# Patient Record
Sex: Female | Born: 1960
Health system: Southern US, Community
[De-identification: ages and names within clinical notes are randomized; demographics above are authoritative.]

## PROBLEM LIST (undated history)

## (undated) DIAGNOSIS — I10 Essential (primary) hypertension: Secondary | ICD-10-CM

## (undated) DIAGNOSIS — M1990 Unspecified osteoarthritis, unspecified site: Secondary | ICD-10-CM

## (undated) HISTORY — PX: COLONOSCOPY: SHX174

## (undated) HISTORY — PX: CHOLECYSTECTOMY: SHX55

---

## 2000-08-09 ENCOUNTER — Encounter (INDEPENDENT_AMBULATORY_CARE_PROVIDER_SITE_OTHER): Payer: Self-pay | Admitting: Specialist

## 2000-08-09 ENCOUNTER — Inpatient Hospital Stay (HOSPITAL_COMMUNITY): Admission: AD | Admit: 2000-08-09 | Discharge: 2000-08-09 | Payer: Self-pay | Admitting: Obstetrics

## 2000-10-09 ENCOUNTER — Emergency Department (HOSPITAL_COMMUNITY): Admission: EM | Admit: 2000-10-09 | Discharge: 2000-10-09 | Payer: Self-pay | Admitting: Emergency Medicine

## 2002-03-07 ENCOUNTER — Emergency Department (HOSPITAL_COMMUNITY): Admission: EM | Admit: 2002-03-07 | Discharge: 2002-03-07 | Payer: Self-pay

## 2004-01-01 ENCOUNTER — Emergency Department (HOSPITAL_COMMUNITY): Admission: EM | Admit: 2004-01-01 | Discharge: 2004-01-01 | Payer: Self-pay | Admitting: Emergency Medicine

## 2004-05-03 ENCOUNTER — Ambulatory Visit (HOSPITAL_COMMUNITY): Admission: RE | Admit: 2004-05-03 | Discharge: 2004-05-03 | Payer: Self-pay | Admitting: Emergency Medicine

## 2004-05-09 ENCOUNTER — Ambulatory Visit (HOSPITAL_COMMUNITY): Admission: RE | Admit: 2004-05-09 | Discharge: 2004-05-09 | Payer: Self-pay | Admitting: Internal Medicine

## 2005-04-24 ENCOUNTER — Emergency Department (HOSPITAL_COMMUNITY): Admission: EM | Admit: 2005-04-24 | Discharge: 2005-04-25 | Payer: Self-pay | Admitting: Emergency Medicine

## 2006-02-20 ENCOUNTER — Emergency Department (HOSPITAL_COMMUNITY): Admission: EM | Admit: 2006-02-20 | Discharge: 2006-02-20 | Payer: Self-pay | Admitting: Emergency Medicine

## 2006-12-02 ENCOUNTER — Inpatient Hospital Stay (HOSPITAL_COMMUNITY): Admission: EM | Admit: 2006-12-02 | Discharge: 2006-12-06 | Payer: Self-pay | Admitting: Emergency Medicine

## 2006-12-03 ENCOUNTER — Ambulatory Visit: Payer: Self-pay | Admitting: Infectious Diseases

## 2006-12-08 ENCOUNTER — Ambulatory Visit: Payer: Self-pay | Admitting: *Deleted

## 2006-12-15 ENCOUNTER — Ambulatory Visit: Payer: Self-pay | Admitting: Family Medicine

## 2006-12-21 ENCOUNTER — Ambulatory Visit: Payer: Self-pay | Admitting: Infectious Diseases

## 2007-01-04 ENCOUNTER — Ambulatory Visit: Payer: Self-pay | Admitting: Internal Medicine

## 2007-01-19 ENCOUNTER — Ambulatory Visit: Payer: Self-pay | Admitting: Family Medicine

## 2007-01-19 ENCOUNTER — Encounter (INDEPENDENT_AMBULATORY_CARE_PROVIDER_SITE_OTHER): Payer: Self-pay | Admitting: Family Medicine

## 2007-01-19 LAB — CONVERTED CEMR LAB: Pap Smear: NORMAL

## 2007-08-15 ENCOUNTER — Encounter (INDEPENDENT_AMBULATORY_CARE_PROVIDER_SITE_OTHER): Payer: Self-pay | Admitting: *Deleted

## 2007-10-05 ENCOUNTER — Encounter (INDEPENDENT_AMBULATORY_CARE_PROVIDER_SITE_OTHER): Payer: Self-pay | Admitting: Family Medicine

## 2007-10-05 DIAGNOSIS — M479 Spondylosis, unspecified: Secondary | ICD-10-CM | POA: Insufficient documentation

## 2008-06-12 ENCOUNTER — Encounter (INDEPENDENT_AMBULATORY_CARE_PROVIDER_SITE_OTHER): Payer: Self-pay | Admitting: Family Medicine

## 2008-06-12 ENCOUNTER — Ambulatory Visit: Payer: Self-pay | Admitting: Internal Medicine

## 2008-10-02 ENCOUNTER — Ambulatory Visit: Payer: Self-pay | Admitting: Family Medicine

## 2008-10-02 LAB — CONVERTED CEMR LAB
AST: 36 units/L (ref 0–37)
Alkaline Phosphatase: 88 units/L (ref 39–117)
BUN: 17 mg/dL (ref 6–23)
Basophils Absolute: 0 10*3/uL (ref 0.0–0.1)
Creatinine, Ser: 0.81 mg/dL (ref 0.40–1.20)
Eosinophils Absolute: 0.1 10*3/uL (ref 0.0–0.7)
Eosinophils Relative: 2 % (ref 0–5)
HCT: 37.6 % (ref 36.0–46.0)
HDL: 46 mg/dL (ref >39)
LDL Cholesterol: 77 mg/dL (ref 0–99)
Lymphocytes Relative: 40 % (ref 12–46)
MCV: 86.2 fL (ref 78.0–100.0)
Neutrophils Relative %: 52 % (ref 43–77)
Platelets: 339 10*3/uL (ref 150–400)
Prothrombin Time: 13.8 s (ref 11.6–15.2)
RDW: 13.1 % (ref 11.5–15.5)
TSH: 2.152 microintl units/mL (ref 0.350–4.50)
Total CHOL/HDL Ratio: 3.3
VLDL: 27 mg/dL (ref 0–40)
Vit D, 1,25-Dihydroxy: 25 — ABNORMAL LOW (ref 30–89)

## 2008-10-27 ENCOUNTER — Ambulatory Visit: Payer: Self-pay | Admitting: Internal Medicine

## 2008-11-17 ENCOUNTER — Ambulatory Visit: Payer: Self-pay | Admitting: Family Medicine

## 2009-03-02 ENCOUNTER — Encounter (INDEPENDENT_AMBULATORY_CARE_PROVIDER_SITE_OTHER): Payer: Self-pay | Admitting: Family Medicine

## 2009-03-02 ENCOUNTER — Encounter: Payer: Self-pay | Admitting: Family Medicine

## 2009-03-02 ENCOUNTER — Ambulatory Visit: Payer: Self-pay | Admitting: Family Medicine

## 2009-03-02 DIAGNOSIS — K029 Dental caries, unspecified: Secondary | ICD-10-CM | POA: Insufficient documentation

## 2009-04-17 ENCOUNTER — Encounter (INDEPENDENT_AMBULATORY_CARE_PROVIDER_SITE_OTHER): Payer: Self-pay | Admitting: Family Medicine

## 2009-04-17 ENCOUNTER — Ambulatory Visit: Payer: Self-pay | Admitting: Family Medicine

## 2009-04-30 ENCOUNTER — Ambulatory Visit (HOSPITAL_COMMUNITY): Admission: RE | Admit: 2009-04-30 | Discharge: 2009-04-30 | Payer: Self-pay | Admitting: Internal Medicine

## 2009-05-05 ENCOUNTER — Ambulatory Visit: Payer: Self-pay | Admitting: Internal Medicine

## 2009-06-11 ENCOUNTER — Encounter: Admission: RE | Admit: 2009-06-11 | Discharge: 2009-06-11 | Payer: Self-pay | Admitting: Family Medicine

## 2009-07-23 ENCOUNTER — Encounter: Admission: RE | Admit: 2009-07-23 | Discharge: 2009-07-23 | Payer: Self-pay | Admitting: Infectious Diseases

## 2009-08-25 ENCOUNTER — Ambulatory Visit: Payer: Self-pay | Admitting: Family Medicine

## 2009-10-27 ENCOUNTER — Ambulatory Visit: Payer: Self-pay | Admitting: Family Medicine

## 2009-10-27 LAB — CONVERTED CEMR LAB
Albumin: 4.3 g/dL (ref 3.5–5.2)
Alkaline Phosphatase: 108 units/L (ref 39–117)
BUN: 19 mg/dL (ref 6–23)
Glucose, Bld: 122 mg/dL — ABNORMAL HIGH (ref 70–99)
HDL: 38 mg/dL — ABNORMAL LOW (ref >39)
LDL Cholesterol: 102 mg/dL — ABNORMAL HIGH (ref 0–99)
Total Bilirubin: 0.3 mg/dL (ref 0.3–1.2)
Triglycerides: 186 mg/dL — ABNORMAL HIGH (ref <150)
VLDL: 37 mg/dL (ref 0–40)

## 2009-11-05 ENCOUNTER — Ambulatory Visit: Payer: Self-pay | Admitting: Internal Medicine

## 2009-11-16 ENCOUNTER — Ambulatory Visit: Payer: Self-pay | Admitting: Internal Medicine

## 2009-12-03 ENCOUNTER — Ambulatory Visit: Payer: Self-pay | Admitting: Family Medicine

## 2009-12-17 ENCOUNTER — Encounter (INDEPENDENT_AMBULATORY_CARE_PROVIDER_SITE_OTHER): Payer: Self-pay | Admitting: Adult Health

## 2009-12-17 ENCOUNTER — Ambulatory Visit: Payer: Self-pay | Admitting: Family Medicine

## 2009-12-17 LAB — CONVERTED CEMR LAB
ALT: 32 units/L (ref 0–35)
AST: 27 units/L (ref 0–37)
Alkaline Phosphatase: 78 units/L (ref 39–117)
Eosinophils Absolute: 0.1 10*3/uL (ref 0.0–0.7)
Eosinophils Relative: 2 % (ref 0–5)
HCT: 37.6 % (ref 36.0–46.0)
Indirect Bilirubin: 0.1 mg/dL (ref 0.0–0.9)
Lymphs Abs: 3 10*3/uL (ref 0.7–4.0)
MCV: 88.7 fL (ref 78.0–100.0)
Monocytes Absolute: 0.4 10*3/uL (ref 0.1–1.0)
Platelets: 340 10*3/uL (ref 150–400)
Total Protein: 7.6 g/dL (ref 6.0–8.3)
WBC: 6.8 10*3/uL (ref 4.0–10.5)

## 2010-01-12 ENCOUNTER — Ambulatory Visit: Payer: Self-pay | Admitting: Family Medicine

## 2010-01-13 ENCOUNTER — Ambulatory Visit (HOSPITAL_COMMUNITY): Admission: RE | Admit: 2010-01-13 | Discharge: 2010-01-13 | Payer: Self-pay | Admitting: Family Medicine

## 2010-02-19 ENCOUNTER — Ambulatory Visit: Payer: Self-pay | Admitting: Internal Medicine

## 2010-02-22 ENCOUNTER — Ambulatory Visit: Payer: Self-pay | Admitting: Internal Medicine

## 2010-02-22 ENCOUNTER — Ambulatory Visit (HOSPITAL_COMMUNITY): Admission: RE | Admit: 2010-02-22 | Discharge: 2010-02-22 | Payer: Self-pay | Admitting: Internal Medicine

## 2010-04-15 ENCOUNTER — Ambulatory Visit: Payer: Self-pay | Admitting: Family Medicine

## 2010-07-13 ENCOUNTER — Ambulatory Visit: Payer: Self-pay | Admitting: Family Medicine

## 2010-09-10 ENCOUNTER — Ambulatory Visit: Payer: Self-pay | Admitting: Family Medicine

## 2010-10-25 ENCOUNTER — Ambulatory Visit (HOSPITAL_COMMUNITY)
Admission: RE | Admit: 2010-10-25 | Discharge: 2010-10-25 | Payer: Self-pay | Source: Home / Self Care | Admitting: Family Medicine

## 2010-12-20 ENCOUNTER — Encounter: Payer: Self-pay | Admitting: Internal Medicine

## 2011-01-18 ENCOUNTER — Emergency Department (HOSPITAL_COMMUNITY)
Admission: EM | Admit: 2011-01-18 | Discharge: 2011-01-19 | Disposition: A | Discharge status: Home / Self Care | Payer: Self-pay | Attending: Emergency Medicine | Admitting: Emergency Medicine

## 2011-01-18 DIAGNOSIS — R05 Cough: Secondary | ICD-10-CM | POA: Insufficient documentation

## 2011-01-18 DIAGNOSIS — M79609 Pain in unspecified limb: Secondary | ICD-10-CM | POA: Insufficient documentation

## 2011-01-18 DIAGNOSIS — E119 Type 2 diabetes mellitus without complications: Secondary | ICD-10-CM | POA: Insufficient documentation

## 2011-01-18 DIAGNOSIS — R079 Chest pain, unspecified: Secondary | ICD-10-CM | POA: Insufficient documentation

## 2011-01-18 DIAGNOSIS — R059 Cough, unspecified: Secondary | ICD-10-CM | POA: Insufficient documentation

## 2011-01-18 DIAGNOSIS — IMO0001 Reserved for inherently not codable concepts without codable children: Secondary | ICD-10-CM | POA: Insufficient documentation

## 2011-01-18 DIAGNOSIS — R6883 Chills (without fever): Secondary | ICD-10-CM | POA: Insufficient documentation

## 2011-01-18 LAB — DIFFERENTIAL
Lymphocytes Relative: 28 % (ref 12–46)
Lymphs Abs: 2.3 10*3/uL (ref 0.7–4.0)
Monocytes Absolute: 0.6 10*3/uL (ref 0.1–1.0)
Monocytes Relative: 7 % (ref 3–12)
Neutro Abs: 5.1 10*3/uL (ref 1.7–7.7)

## 2011-01-18 LAB — URINALYSIS, ROUTINE W REFLEX MICROSCOPIC
Ketones, ur: NEGATIVE mg/dL
Nitrite: NEGATIVE
Protein, ur: NEGATIVE mg/dL
Urobilinogen, UA: 0.2 mg/dL (ref 0.0–1.0)

## 2011-01-18 LAB — BASIC METABOLIC PANEL
BUN: 16 mg/dL (ref 6–23)
Calcium: 9.6 mg/dL (ref 8.4–10.5)
Creatinine, Ser: 0.8 mg/dL (ref 0.4–1.2)
GFR calc non Af Amer: >60 mL/min (ref >60)
Potassium: 4.3 mEq/L (ref 3.5–5.1)

## 2011-01-18 LAB — CBC
HCT: 37.4 % (ref 36.0–46.0)
Hemoglobin: 12.5 g/dL (ref 12.0–15.0)
MCH: 28.8 pg (ref 26.0–34.0)
MCHC: 33.4 g/dL (ref 30.0–36.0)
RBC: 4.34 MIL/uL (ref 3.87–5.11)

## 2011-01-19 ENCOUNTER — Emergency Department (HOSPITAL_COMMUNITY): Payer: Self-pay

## 2011-01-28 ENCOUNTER — Encounter (INDEPENDENT_AMBULATORY_CARE_PROVIDER_SITE_OTHER): Payer: Self-pay | Admitting: Family Medicine

## 2011-04-15 NOTE — Discharge Summary (Signed)
Natalie Cherry, Natalie Cherry                 ACCOUNT NO.:  [id number]   MEDICAL RECORD NO.:  [id number]          PATIENT TYPE:  INP   LOCATION:  1618                         FACILITY:  [facility]   PHYSICIAN:  Isidor Holts, M.D.  DATE OF BIRTH:  02-Dec-1960   DATE OF ADMISSION:  12/02/2006  DATE OF DISCHARGE:  12/06/2006                               DISCHARGE SUMMARY   DISCHARGE DIAGNOSES:  1. Neurocysticercosis.  2. Type 2 diabetes mellitus.  3. Dyslipidemia.  4. Cervical spondylosis.  5. Acute pharyngitis.  6. History of hypertension.   DISCHARGE MEDICATIONS:  1. Glucophage 500 mg p.o. b.i.d.  2. Glipizide 5 mg p.o. b.i.d.  3. Enteric-coated Aspirin 81 mg p.o. daily.  4. Zocor 20 mg p.o. nightly.  5. Albendazole 400 mg p.o. b.i.d. to be completed on December 17, 2006.  6. Dexamethasone 2 mg p.o. t.i.d. to be completed on December 10, 2006.  7. Magic Mouthwash 5 mL p.o. t.i.d. (swish-and-swallow) for 1 week      only.   PROCEDURES:  1. Two-view chest x-ray dated December 02, 2006.  This showed suboptimal      inspiration with mild bibasilar atelectasis.  2. Head CT scan dated December 02, 2006.  This was a negative      noncontrast head CT.  3. X-ray of cervical spine dated December 02, 2006.  This showed      straightening of the usual lordosis, no evidence of fracture or      static signs of instability, degenerative disk disease, and      spondylosis C5-C6.  4. Brain MRI dated December 02, 2006.  This showed an 8 mm lesion in the      right middle temporal lobe with mild enhancement, 8mm cystic      lesion, possibly with some slight rim enhancement in the right      middle temporal lobe.  Differential diagnosis includes      neurocysticercosis.  5. Lumbar puncture under fluoroscopic guidance dated, December 02, 2006.      This was a successful procedure and there was return of clear      cerebrospinal fluid.  Approximately 8 mL of clear cerebrospinal      fluid was obtained for laboratory  studies.  Microscopic showed WBC 0, RBC 162. Gram stain was negative.  Protein was  20.  Glucose 63.  1. MRI cervical spine dated December 03, 2006.  This showed a moderate      amount of retropharyngeal edema and enhancement likely due to acute      inflammatory pharyngitis.  No evidence of spinal infection or cord      lesions is identified.  Mild cervical disk degeneration is noted.  2. Abdominal ultrasound scan dated December 04, 2006.  This showed no      abnormality.  No gallstones.   CONSULTATIONS:  1. Dr. Deneen Harts, neurologist.  2. Dr. Lina Sayre, infectious disease specialist.  3. Dr. Ether Griffins, ophthalmologist.   ADMISSION HISTORY:  As in H and P notes of December 02, 2006. However, in  brief, this is a  50 year old female, with known history of hypertension,  type 2 diabetes mellitus, and uterine fibroids, who presents with a 2-  day history of left-sided headache, left-sided facial/neck/shoulder and  arm pain/numbness.  Detailed history is significant for the fact that  the patient is originally [place], has resided in the [place] for  approximately 20 years, but frequently travels to [place].  As a Psychologist, clinical  of fact, she was in [place] one month ago.   PHYSICAL EXAMINATION:  Mild left upper and lower extremity weakness as  well as a mild restriction of cervical spine motion and left-sided  mastoid/trapezius muscle tenderness.  The initial head CT scan was  negative for acute pathology.  However, the followup brain MRI showed an  8 mm lesion in the right posterior temporal lobe with mild enhancement  and 8 mm cyst right temporal lobe with mild rim enhancement.  Neurocysticercosis was favored.  The patient was admitted for further  evaluation, investigation, and management.   CLINICAL COURSE:  1. Neurocysticercosis.  This is a patient who frequently travels to an      endemic region, presenting with headache, mild focal neurology, and      brain imaging studies  suggestive for neurocysticercosis.  Neurology      consultation was called, which was kindly provided by Dr. Deneen Harts who agreed with this possibility and recommended MRI of the      neck to look for structural lesions contributing to the patient's      headache and shoulder pain, and also risk factor modification,      against a background of type 2 diabetes mellitus and also agreed      that ID consultation would be a good idea.  ID consultation was      requested.  This was kindly provided by Dr. Lina Sayre, who felt      that while the differential diagnoses was quite lengthy including      fungi, tuberculosis, toxoplasmosis, tumor, classic brain abscess,      and multiple sclerosis, cysticercosis fits best epidemiologically      and clinically.  Lumbar puncture was done under fluoroscopic      guidance.  This showed no white cells.  RBC was 162, glucose 63,      protein 20.  CSF serologies and serum serologies for      neurocysticercosis antigen/antibodies were sent; however, at the      time of this dictation, results had not yet been obtained.      Recommendation from Dr. Maurice March, was to commence treatment for the      clinical diagnosis of neurocysticercosis with a combination of a 2-      week course of albendazole and a 1-week course of dexamethasone.      This was implemented accordingly.   1. Cervical spondylosis.  As mentioned in admission history above, the      patient, at the time of presentation, complained of left facial      numbness and pain as well as left shoulder and arm pain and      numbness.  She also, on physical examination, had restriction of      lateral rotation of neck to the left and had tenderness in the left      sternomastoid and trapezius muscles.  X-ray of the cervical spine,      as well as followup MRI demonstrated mild degenerative joint     disease.  She was managed with simple analgesics, with a      satisfactory clinical response.  By  day #2 of hospitalization, her      symptoms had completely ameliorated.   1. Acute pharyngitis.  This was an incidental finding noted on MRI.      The patient at no time complained of sore throat.  However, against      a background of diabetes mellitus, it is possible that she may have      oral thrush.  She was managed with Magic Mouthwash.   1. Type 2 diabetes mellitus.  The patient is a known case of type 2      diabetes mellitus and, pre-admission, was on 425 mg of metformin      p.o.  She was managed with a carbohydrate-modified diet and a      combination of metformin and Glipizide during her hospitalization,      with a satisfactory glycemic response.  Hemoglobin A1c was 10.2.      It is clear that she has had suboptimal blood glucose control in      the preceding 3 months.  We anticipate even further improvement in      her glycemic control, once steroid treatment is completed on      December 10, 2006.   1. Dyslipidemia.  Lipid profile done as part of workup for risk      factors, showed the following findings:  total cholesterol 169,      triglycerides 109, HDL 31, LDL 116.  The patient has been commenced      on Statin treatment.   1. Hypertension.  The patient has a known history of hypertension,      although she was not on any antihypertensive medications, pre-      admission.  She has remained normotensive throughout her      hospitalization, therefore, no specific treatment has been      instituted.  We expect the patient's primary MD to continue to      monitor her blood pressure, and institute medications if indicated.   1. Abnormal liver function tests.  At the time of initial evaluation,      the patient was noted to have mildly abnormal liver function tests      with AST of 41, ALT 74, alkaline phosphatase 120.  Hepatitis B and      C profiles were negative and liver ultrasound scan was      unremarkable.  The patient has been reassured accordingly.    DISPOSITION:  As of December 06, 2006, the patient was completely  asymptomatic and keen to go home.  She had no discernible extremity  weakness and no longer complained of pain.  She was therefore deemed  sufficiently clinically recovered and stable to be discharged on December 06, 2006.  The patient underwent slit lamp examination by Dr. Ether Griffins,  ophthalmologist, on December 05, 2006, which showed no evidence of retinal  neurocysticercosis; however, it is felt that she will benefit from  general ophthalmologic evaluation and followup on an outpatient basis,  with ophthalmologist in 1 month, due to subnormal visual acuity.   DIET:  Carbohydrate modified/Heart healthy diet.   ACTIVITY:  As tolerated.   WOUND CARE:  Nonapplicable.   PAIN MANAGEMENT:  Nonapplicable.   FOLLOWUP INSTRUCTIONS:  The patient is instructed to follow up with Dr. Lina Sayre, infectious disease specialist after 2 weeks.  Telephone  number [phone].  The  patient is also instructed to follow up with Dr.  Ether Griffins, eye specialist, in 1 month.  Telephone number [phone].  She  has been strongly recommended to establish a primary M.D. for routine  and preventative care, at [facility].  Appropriate information has  been supplied.  All of this has been communicated to patient and her  daughter.  They have verbalized understanding.      Isidor Holts, M.D.  Electronically Signed     CO/MEDQ  D:  12/06/2006  T:  12/06/2006  Job:  [id number]   cc:   [facility]   Fransisco Hertz, M.D.  Fax: [phone]   Pasty Spillers. Maple Hudson, M.D.  Fax: [phone]

## 2011-04-15 NOTE — Consult Note (Signed)
Natalie Cherry, Natalie Cherry                 ACCOUNT NO.:  [id number]   MEDICAL RECORD NO.:  [id number]          Cherry TYPE:  INP   LOCATION:  1618                         FACILITY:  [facility]   PHYSICIAN:  Bevelyn Buckles. Champey, M.D.DATE OF BIRTH:  Feb 21, 1961   DATE OF CONSULTATION:  12/03/2006  DATE OF DISCHARGE:                                 CONSULTATION   REASON FOR CONSULTATION:  Evaluation for headache, left upper extremity  pain, numbness and possible neurocystercicosis.   HISTORY OF PRESENT ILLNESS:  Natalie Cherry is a 50 year old Hispanic  female with multiple medical problems who presents with three to four  day history of left-sided headache, left upper extremity pain and  numbness.  Her symptoms started gradually with left pain starting behind  her left ear and radiating to Natalie left neck and left upper extremity.  Her symptoms have been fairly constant; however, have been improving  gradually over Natalie last two days.  Natalie Cherry describes it is as  pulsating, 10 out of 10, in intensity and now is around 6 out of 10 in  intensity with pain medicines helping.  Natalie Cherry denies any right  upper extremity symptoms, bilateral lower extremity symptoms.  Natalie  Cherry denies any vision changes, speech changes, swallowing problems  or chewing problems, dizziness, vertigo, balance problems, falls, or  loss of consciousness.  Daughter translates secondary to language  barrier.   PAST MEDICAL HISTORY:  Positive for hypertension and diabetes.   MEDICATIONS:  Protonix, metformin, insulin, Darvocet p.r.n., Zofran  p.r.n.   ALLERGIES:  Natalie Cherry has no known drug allergies.   SOCIAL HISTORY:  Natalie Cherry lives with her husband.  Denies alcohol or  tobacco use.   FAMILY HISTORY:  Positive for diabetes and asthma.   REVIEW OF SYSTEMS:  Positive as per HPI.  Review of systems negative as  per HPI and graded on 8 other organ systems.   PHYSICAL EXAMINATION:  VITAL SIGNS:  Temperature 97.1,  blood pressure  114/64, pulse 67, respirations 20, oxygen saturation 96%.  HEENT:  Normocephalic, atraumatic.  Extraocular muscles intact.  Visual  fields are intact.  NECK:  Supple.  No carotid bruits.  HEART:  Regular.  LUNGS:  Clear.  ABDOMEN:  Soft, nontender.  EXTREMITIES:  Good pulses.  NEUROLOGIC:  Natalie Cherry is awake, alert, following commands  appropriately.  Language is fluent.  There is only barrier for which Natalie  daughter translates.  Cranial nerves II-XII grossly intact.  Motor  examination shows 5 out of 5 strength in Natalie right upper extremity and  bilateral lower extremities.  Natalie Cherry has left upper extremity  limited secondary to extreme discomfort.  Seems to be approximately 4 to  4+ out of 5 strength.  Sensory examination is within normal limits to  light touch.  Reflexes are 1-2+ throughout, toes are withdrawn  bilaterally.  Cerebellar function is within normal limits.  Finger-to-  nose.  Gait is not tested secondary to safety.   LABORATORY DATA:  WBC 9.7, hemoglobin 13.7, hematocrit 40.5, platelets  373,000.  Sodium 135, potassium 3.9, chloride 104, CO2  26, BUN 13,  creatinine 0.12, glucose 145, AST 41, ALT 7.4.  Hemoglobin A1C 10.2.  Calcium 9, cholesterol 1.67, LDL 116.  CSF showed no wbc's and rbc's  162, glucose 63, protein 20.  CT scan of Natalie head was negative.  MRI of  Natalie brain showed a right mesiotemporal enhancing lesion and a right  medial temporal cystic lesion with rim enhancement.   IMPRESSION:  This 50 year old Hispanic female has left-sided head and  neck and upper extremity and numbness.  I reviewed Natalie MRI and agree  with possibility of neurocysticercosis.  Serology and CSF have been  sent.  I would recommend continued pain management and get infectious  disease consult which has already occurred.  Recommend getting PT/OT  consult for her pain and numbness.  I will check an MRI of Natalie neck to  look for any structural lesions that also could  be contributing.  Natalie  Cherry does seem to have risk factors for vascular disease including  hemoglobin A1C and elevated cholesterol.  Recommend starting an aspirin  a day. Will consider statin a day as well.  However, currently has  elevated LFTs which need to be evaluated first.  Natalie Cherry needs  optimal diabetes controls as well.  Would recommend checking other  possible etiologies through infectious disease such as HIV or other  infectious or inflammatory processes.  We will follow Natalie Cherry with  you.      Bevelyn Buckles. Nash Shearer, M.D.  Electronically Signed     DRC/MEDQ  D:  12/03/2006  T:  12/03/2006  Job:  [id number]

## 2011-04-15 NOTE — H&P (Signed)
Natalie Cherry, Natalie Cherry                 ACCOUNT NO.:  [id number]   MEDICAL RECORD NO.:  [id number]          PATIENT TYPE:  INP   LOCATION:  1618                         FACILITY:  [facility]   PHYSICIAN:  Isidor Holts, M.D.  DATE OF BIRTH:  February 29, 1961   DATE OF ADMISSION:  12/02/2006  DATE OF DISCHARGE:                              HISTORY & PHYSICAL   PRIMARY MEDICAL DOCTOR:  Unassigned.   CHIEF COMPLAINT:  Left-sided headache for 2 days, also left-sided  facial, neck, shoulder, and arm pain/numbness.   HISTORY OF PRESENT ILLNESS:  This is a 50 year old female.  For past  medical history, see below.  The patient is a [place] and has very poor  command of English; therefore, history is mainly obtained from the  patient's daughter, who has accompanied her to the emergency department  and has been able to translate quite effectively.  Apparently, the  patient was quite well until 2 days ago, when she developed a headache  affecting mainly the left side of her head, unrelieved by over-the-  counter ibuprofen and Goody powders, not associated with vomiting or  fever.  According to the patient, the headache also affects the left  side of her head, the neck, left shoulder, and arm and is associated  with left-sided weakness.  The patient has been a resident in the [place] for over 20 years but regularly travels to [place].  As a matter  of fact, she was in [place] last month.   PAST MEDICAL HISTORY:  1. Hypertension.  2. Type 2 diabetes mellitus.  3. History of uterine fibroids.   MEDICATION HISTORY.:  Metformin (850 mg) Half tablet daily.   ALLERGIES:  NO KNOWN DRUG ALLERGIES.   SOCIAL HISTORY:  The patient is married, has six children all of whom  are alive and well.  She is a nonsmoker, nondrinker, has no history of  drug abuse.   FAMILY HISTORY:  The patient's mother died of complications of diabetes  mellitus.  She also had heart disease and hypertension.  Her father  passed  away from complications of bronchial asthma.  Family history is  otherwise noncontributory.   PHYSICAL EXAMINATION:  VITAL SIGNS:  Temperature 98.8, pulse 86 per  minute regular, respiratory 21, BP 112/58-mmHg, pulse oximeter 97% on  room air.  GENERAL:  The patient appears quite alert, communicative, not short of  breath at rest, follows commands quite easily, does not appear to be in  obvious acute discomfort, after administration of morphine intravenously  in the emergency department.  She also does not appear to be  photophobic.  HEENT:  No clinical pallor, no jaundice, no conjunctival injection.  Throat is clear.  NECK:  Supple.  JVP not seen.  No palpable lymphadenopathy.  No palpable  goiter.  CHEST:  Clinically clear to auscultation.  No wheezes, no crackles.  HEART:  Sounds one and two heard, normal, regular, no murmurs.  ABDOMEN:  Moderately obese, soft and nontender.  There is no palpable  organomegaly.  No palpable masses.  Normal bowel sounds.  EXTREMITIES:  Lower extremity examination  no pitting edema.  Palpable  peripheral pulses.  MUSCULOSKELETAL:  The patient has full range of motion all joints.  However, experiences mild restriction of lateral rotation of neck,  secondary to pain.  She also has appreciable tenderness left  sternomastoid and left trapezius muscles.  CENTRAL NERVOUS SYSTEM:  The patient is alert, oriented x3,  communicative, has no obvious cranial nerve deficits.  However, she has  mild weakness left upper and lower extremities with power of 4-/5.  Plantar's are downgoing bilaterally.  She has normal power, tone, and  coordination of the right upper and lower extremities.  Coordination is  unimpaired on the left.   INVESTIGATIONS:  CBC:  WBC 9.7, eosinophils 1%, hemoglobin 13.7,  hematocrit 40.5, platelets 373.  Urinalysis negative.  Urine pregnancy  test is negative.  Electrolytes:  Sodium 135, potassium 3.9, chloride  104, CO2 26, BUN 3,  creatinine 0.12, glucose 145, AST 41.  EKG dated  December 02, 2006, shows sinus rhythm regular, 87 beats per minute, left  axis deviation.  No acute ischemic changes.  X-ray, cervical spine,  dated December 02, 2006, shows DJD and spondylosis C5-C6.  Chest x-ray,  dated December 02, 2006, shows suboptimal inspiration and mild bibasilar  atelectasis.  Head CT scan, dated December 02, 2006, is negative with no  evidence of raised intracranial pressure or mass effect.  Brain MRI,  dated December 02, 2006, shows an 8-mm lesion right posterior temporal  lobe with mild enhancement, also 8-mm cyst right temporal lobe with mild  rim enhancement.  No calcifications.  Neurocysticercosis is favored.   ASSESSMENT AND PLAN:  1. Persistent headache with left-sided paresthesia, pain, and      weakness.  This, against a background of frequent travel to      possible endemic region and suggestive MRI findings, suggest the      possibility of neurocysticercosis.  We shall manage with      analgesics, frequent neuro checks, do blood and CSF serology, and      request neurology as well as infectious diseases consultations.   1. Cervical spondylosis.  This is confirmed by x-ray and may be      contributory to left neck, shoulder as well as arm pain.  The      patient also has clinically elicitable left sternomastoid and      trapezius tenderness.  We shall manage with analgesics.   1. Type 2 diabetes mellitus, query control.  We will do a hemoglobin      A1c for completeness, commence Glucophage therapy, use a      carbohydrate-modified diet, and sliding scale insulin coverage.   1. History of hypertension.  The patient is normotensive at present.      We shall monitor.   Further management will depend on clinical course.      Isidor Holts, M.D.  Electronically Signed     CO/MEDQ  D:  12/02/2006  T:  12/02/2006  Job:  [id number]

## 2011-04-26 ENCOUNTER — Ambulatory Visit (HOSPITAL_COMMUNITY)
Admission: RE | Admit: 2011-04-26 | Discharge: 2011-04-26 | Disposition: A | Discharge status: Home / Self Care | Payer: Self-pay | Source: Ambulatory Visit | Attending: Family Medicine | Admitting: Family Medicine

## 2011-04-26 ENCOUNTER — Other Ambulatory Visit (HOSPITAL_COMMUNITY): Payer: Self-pay | Admitting: Family Medicine

## 2011-04-26 DIAGNOSIS — M899 Disorder of bone, unspecified: Secondary | ICD-10-CM | POA: Insufficient documentation

## 2011-04-26 DIAGNOSIS — M949 Disorder of cartilage, unspecified: Secondary | ICD-10-CM | POA: Insufficient documentation

## 2011-04-26 DIAGNOSIS — M069 Rheumatoid arthritis, unspecified: Secondary | ICD-10-CM

## 2011-04-26 DIAGNOSIS — M25649 Stiffness of unspecified hand, not elsewhere classified: Secondary | ICD-10-CM | POA: Insufficient documentation

## 2011-04-26 DIAGNOSIS — M25549 Pain in joints of unspecified hand: Secondary | ICD-10-CM | POA: Insufficient documentation

## 2012-03-12 ENCOUNTER — Encounter (HOSPITAL_COMMUNITY): Payer: Self-pay

## 2012-03-12 ENCOUNTER — Emergency Department (INDEPENDENT_AMBULATORY_CARE_PROVIDER_SITE_OTHER)
Admission: EM | Admit: 2012-03-12 | Discharge: 2012-03-12 | Disposition: A | Discharge status: Home / Self Care | Payer: Self-pay | Source: Home / Self Care | Attending: Family Medicine | Admitting: Family Medicine

## 2012-03-12 DIAGNOSIS — M069 Rheumatoid arthritis, unspecified: Secondary | ICD-10-CM

## 2012-03-12 DIAGNOSIS — Z760 Encounter for issue of repeat prescription: Secondary | ICD-10-CM

## 2012-03-12 HISTORY — DX: Essential (primary) hypertension: I10

## 2012-03-12 HISTORY — DX: Unspecified osteoarthritis, unspecified site: M19.90

## 2012-03-12 MED ORDER — PREDNISONE 5 MG PO TABS: 5.0000 mg | ORAL_TABLET | Freq: Every day | ORAL | Status: AC

## 2012-03-12 MED ORDER — PREDNISONE 5 MG PO TABS: 5.0000 mg | ORAL_TABLET | Freq: Every day | ORAL | Status: DC

## 2012-03-12 NOTE — ED Provider Notes (Signed)
History     CSN: [id number]  Arrival date & time 03/12/12  1343   First MD Initiated Contact with Patient 03/12/12 1534      Chief Complaint  Patient presents with  . Arthritis    (Consider location/radiation/quality/duration/timing/severity/associated sxs/prior treatment) HPI Comments: The patient states she has ra and has been out of prednisone x 1 months. She normally taked 5 mg daily. [facility] . She has pain in her back and hands. She has been taking naproxen with minimal relief.   The history is provided by the patient and a relative. The history is limited by a language barrier.    Past Medical History  Diagnosis Date  . Hypertension   . Arthritis   . Diabetes mellitus     History reviewed. No pertinent past surgical history.  History reviewed. No pertinent family history.  History  Substance Use Topics  . Smoking status: Never Smoker   . Smokeless tobacco: Not on file  . Alcohol Use: No    OB History    Grav Para Term Preterm Abortions TAB SAB Ect Mult Living                  Review of Systems  Constitutional: Negative.   HENT: Negative.   Respiratory: Negative.   Cardiovascular: Negative.   Gastrointestinal: Negative.   Genitourinary: Negative.     Allergies  Review of patient's allergies indicates no known allergies.  Home Medications   Current Outpatient Rx  Name Route Sig Dispense Refill  . MECLIZINE HCL 25 MG PO TABS Oral Take 25 mg by mouth 4 (four) times daily as needed.    Marland Kitchen METFORMIN HCL 500 MG PO TABS Oral Take 500 mg by mouth 2 (two) times daily with a meal.    . NAPROXEN 500 MG PO TABS Oral Take 500 mg by mouth 2 (two) times daily with a meal.    . TRAZODONE HCL 50 MG PO TABS Oral Take 50 mg by mouth at bedtime.    Marland Kitchen PREDNISONE 5 MG PO TABS Oral Take 1 tablet (5 mg total) by mouth daily. 35 tablet 0  . PREDNISONE 5 MG PO TABS Oral Take 1 tablet (5 mg total) by mouth daily. 35 tablet 0    BP 100/51   Pulse 74  Temp(Src) 98 F (36.7 C) (Oral)  Resp 14  SpO2 98%  Physical Exam  Nursing note and vitals reviewed. Constitutional: She appears well-developed and well-nourished.  HENT:  Head: Normocephalic and atraumatic.  Cardiovascular: Normal rate and regular rhythm.   Pulmonary/Chest: Effort normal and breath sounds normal.  Musculoskeletal:       Pain in fingers and hands. No increased warmpth  Skin: Skin is warm and dry.    ED Course  Procedures (including critical care time)  Labs Reviewed - No data to display No results found.   1. Rheumatoid arthritis   2. Medication refill       MDM          Randa Spike, MD 03/12/12 1601

## 2012-03-12 NOTE — ED Notes (Signed)
Pt has "inflammatory arthritis". States over the weekend hands and feet have swollen and are painful.  Also c/o pain in back.  [facility] told her to come here, she is out of prednisone and trazodone

## 2012-03-12 NOTE — Discharge Instructions (Signed)
Follow up with your pcp as scheduled. Artritis Reumatoide (Arthritis, Rheumatoid) Usted tiene artritis reumatoide. Es una artritis causada por problemas en el sistema inmunolgico. Los sntomas incluyen hinchazn, Engineer, mining, sensibilidad y ardor en las articulaciones afectadas. Ocurre ms a [employer], los pies, y las rodillas. El dolor puede trasladarse de una articulacin a [place]. Tambin podrn aparecer otros sntomas como fiebre, prdida de [place], cansancio y anemia. Esta enfermedad no tiene [place]. La mayor parte de las personas logran estar bien con tratamiento. Unos pocos pacientes sufren daos graves en la articulacin y discapacidad. Utilice los medicamentos de venta libre o de prescripcin para Chief Technology Officer o Environmental health practitioner segn se lo indique el profesional que lo asiste. Pueden utilizarse drogas ms poderosas tales como cortisona y narcticos, cuando los sntomas son ms graves. Las terapias de calor y otras terapias fsicas a menudo resultan de [place]. Existen nuevos medicamentos que tratan el problema en el sistema inmunolgico subyacente. Estos son prescritos por especialistas. Deber Printmaker mayor actividad que le sea posible, pero deber reposar ms si tiene un ataque de Engineer, mining. En ocasiones es necesario entablillar de forma temporaria las articulaciones doloridas. Vea a su mdico o a Music therapist en articulaciones o reumatlogo para realizar un tratamiento adecuado a Air cabin crew. Document Released: 11/14/2005 Document Revised: 11/03/2011 [facility] Patient Information 2012 [place], [place].

## 2012-04-10 ENCOUNTER — Other Ambulatory Visit (HOSPITAL_COMMUNITY): Payer: Self-pay | Admitting: Family Medicine

## 2012-04-10 DIAGNOSIS — M1990 Unspecified osteoarthritis, unspecified site: Secondary | ICD-10-CM

## 2012-04-17 ENCOUNTER — Ambulatory Visit (HOSPITAL_COMMUNITY)
Admission: RE | Admit: 2012-04-17 | Discharge: 2012-04-17 | Disposition: A | Discharge status: Home / Self Care | Payer: Self-pay | Source: Ambulatory Visit | Attending: Family Medicine | Admitting: Family Medicine

## 2012-04-17 DIAGNOSIS — Z13820 Encounter for screening for osteoporosis: Secondary | ICD-10-CM | POA: Insufficient documentation

## 2012-04-17 DIAGNOSIS — M129 Arthropathy, unspecified: Secondary | ICD-10-CM | POA: Insufficient documentation

## 2012-04-17 DIAGNOSIS — M1990 Unspecified osteoarthritis, unspecified site: Secondary | ICD-10-CM

## 2014-01-29 ENCOUNTER — Ambulatory Visit: Payer: No Typology Code available for payment source | Attending: Internal Medicine | Admitting: Internal Medicine

## 2014-01-29 ENCOUNTER — Encounter: Payer: Self-pay | Admitting: Internal Medicine

## 2014-01-29 VITALS — BP 108/71 | HR 72 | Temp 98.5°F | Resp 16 | Wt 162.6 lb

## 2014-01-29 DIAGNOSIS — M069 Rheumatoid arthritis, unspecified: Secondary | ICD-10-CM | POA: Insufficient documentation

## 2014-01-29 DIAGNOSIS — H9190 Unspecified hearing loss, unspecified ear: Secondary | ICD-10-CM

## 2014-01-29 DIAGNOSIS — Z139 Encounter for screening, unspecified: Secondary | ICD-10-CM

## 2014-01-29 DIAGNOSIS — E1165 Type 2 diabetes mellitus with hyperglycemia: Principal | ICD-10-CM

## 2014-01-29 DIAGNOSIS — Z1211 Encounter for screening for malignant neoplasm of colon: Secondary | ICD-10-CM

## 2014-01-29 DIAGNOSIS — E119 Type 2 diabetes mellitus without complications: Secondary | ICD-10-CM

## 2014-01-29 DIAGNOSIS — I10 Essential (primary) hypertension: Secondary | ICD-10-CM | POA: Insufficient documentation

## 2014-01-29 DIAGNOSIS — E559 Vitamin D deficiency, unspecified: Secondary | ICD-10-CM | POA: Insufficient documentation

## 2014-01-29 DIAGNOSIS — IMO0001 Reserved for inherently not codable concepts without codable children: Secondary | ICD-10-CM | POA: Insufficient documentation

## 2014-01-29 LAB — LIPID PANEL
CHOLESTEROL: 160 mg/dL (ref 0–200)
HDL: 48 mg/dL (ref >39)
LDL Cholesterol: 92 mg/dL (ref 0–99)
TRIGLYCERIDES: 100 mg/dL (ref <150)
Total CHOL/HDL Ratio: 3.3 Ratio
VLDL: 20 mg/dL (ref 0–40)

## 2014-01-29 LAB — CBC WITH DIFFERENTIAL/PLATELET
BASOS PCT: 0 % (ref 0–1)
Basophils Absolute: 0 10*3/uL (ref 0.0–0.1)
EOS ABS: 0 10*3/uL (ref 0.0–0.7)
EOS PCT: 0 % (ref 0–5)
HCT: 33.3 % — ABNORMAL LOW (ref 36.0–46.0)
Hemoglobin: 10.3 g/dL — ABNORMAL LOW (ref 12.0–15.0)
LYMPHS ABS: 1.1 10*3/uL (ref 0.7–4.0)
Lymphocytes Relative: 14 % (ref 12–46)
MCH: 24.1 pg — AB (ref 26.0–34.0)
MCHC: 30.9 g/dL (ref 30.0–36.0)
MCV: 77.8 fL — AB (ref 78.0–100.0)
Monocytes Absolute: 0.3 10*3/uL (ref 0.1–1.0)
Monocytes Relative: 4 % (ref 3–12)
Neutro Abs: 6.2 10*3/uL (ref 1.7–7.7)
Neutrophils Relative %: 82 % — ABNORMAL HIGH (ref 43–77)
PLATELETS: 524 10*3/uL — AB (ref 150–400)
RBC: 4.28 MIL/uL (ref 3.87–5.11)
RDW: 16 % — AB (ref 11.5–15.5)
WBC: 7.5 10*3/uL (ref 4.0–10.5)

## 2014-01-29 LAB — COMPLETE METABOLIC PANEL WITH GFR
ALT: 10 U/L (ref 0–35)
AST: 10 U/L (ref 0–37)
Albumin: 3.4 g/dL — ABNORMAL LOW (ref 3.5–5.2)
Alkaline Phosphatase: 92 U/L (ref 39–117)
BILIRUBIN TOTAL: 0.2 mg/dL (ref 0.2–1.2)
BUN: 16 mg/dL (ref 6–23)
CO2: 24 meq/L (ref 19–32)
CREATININE: 0.59 mg/dL (ref 0.50–1.10)
Calcium: 9.1 mg/dL (ref 8.4–10.5)
Chloride: 102 mEq/L (ref 96–112)
GLUCOSE: 336 mg/dL — AB (ref 70–99)
Potassium: 4.2 mEq/L (ref 3.5–5.3)
Sodium: 136 mEq/L (ref 135–145)
Total Protein: 7 g/dL (ref 6.0–8.3)

## 2014-01-29 LAB — POCT GLYCOSYLATED HEMOGLOBIN (HGB A1C): Hemoglobin A1C: 12

## 2014-01-29 LAB — GLUCOSE, POCT (MANUAL RESULT ENTRY): POC Glucose: 341 mg/dl — AB (ref 70–99)

## 2014-01-29 LAB — TSH: TSH: 0.721 u[IU]/mL (ref 0.350–4.500)

## 2014-01-29 MED ORDER — FOLIC ACID 1 MG PO TABS: 1.0000 mg | ORAL_TABLET | Freq: Every day | ORAL | Status: DC

## 2014-01-29 MED ORDER — INSULIN ASPART 100 UNIT/ML ~~LOC~~ SOLN
10.0000 [IU] | Freq: Once | SUBCUTANEOUS | Status: AC
  Administered 2014-01-29: 10 [IU] via SUBCUTANEOUS

## 2014-01-29 MED ORDER — SITAGLIPTIN PHOS-METFORMIN HCL 50-1000 MG PO TABS: 1.0000 | ORAL_TABLET | Freq: Two times a day (BID) | ORAL | Status: DC

## 2014-01-29 MED ORDER — FREESTYLE SYSTEM KIT: 1.0000 | PACK | Status: DC | PRN

## 2014-01-29 MED ORDER — NAPROXEN 500 MG PO TABS: 500.0000 mg | ORAL_TABLET | Freq: Two times a day (BID) | ORAL | Status: DC

## 2014-01-29 NOTE — Progress Notes (Signed)
Patient here to establish care History of arthritis and DM

## 2014-01-29 NOTE — Progress Notes (Signed)
Patient Demographics  Natalie Cherry, is a 53 y.o. female  TRR:[id number]  BFX:[id number]  DOB - [birth year]  CC:  Chief Complaint  Patient presents with  . Establish Care       HPI: Natalie Cherry is a 53 y.o. female here today to establish medical care. Patient has history of diabetes for several years as per patient she was taking metformin in the past which was discontinued and she was put on glipizide which she takes 10 mg twice a day, she denies any problem with metformin, at some point she was using Januvia which use to control her symptoms very well, today her blood sugar level is 341 , hemoglobin A1c is 12%, she is history of rheumatoid arthritis and follows up with the rheumatologist and is on methotrexate and prednisone, patient is requesting prescription for naproxen and folic acid. Patient complains of reduced hearing and ringing sensation in the left ear. Patient has No headache, No chest pain, No abdominal pain - No Nausea, No new weakness tingling or numbness, No Cough - SOB.  No Known Allergies Past Medical History  Diagnosis Date  . Hypertension   . Arthritis   . Diabetes mellitus    Current Outpatient Prescriptions on File Prior to Visit  Medication Sig Dispense Refill  . meclizine (ANTIVERT) 25 MG tablet Take 25 mg by mouth 4 (four) times daily as needed.      . metFORMIN (GLUCOPHAGE) 500 MG tablet Take 500 mg by mouth 2 (two) times daily with a meal.      . predniSONE (DELTASONE) 5 MG tablet Take 1 tablet (5 mg total) by mouth daily.  35 tablet  0  . traZODone (DESYREL) 50 MG tablet Take 50 mg by mouth at bedtime.       No current facility-administered medications on file prior to visit.   Family History  Problem Relation Age of Onset  . Diabetes Mother    History   Social History  . Marital Status: Married    Spouse Name: N/A    Number of Children: N/A  . Years of Education: N/A   Occupational History  . Not on file.   Social History Main Topics   . Smoking status: Never Smoker   . Smokeless tobacco: Not on file  . Alcohol Use: No  . Drug Use: No  . Sexual Activity: Not on file   Other Topics Concern  . Not on file   Social History Narrative  . No narrative on file    Review of Systems: Constitutional: Negative for fever, chills, diaphoresis, activity change, appetite change and fatigue. HENT: Negative for ear pain, nosebleeds, congestion, facial swelling, rhinorrhea, neck pain, neck stiffness and ear discharge.  Eyes: Negative for pain, discharge, redness, itching and visual disturbance. Respiratory: Negative for cough, choking, chest tightness, shortness of breath, wheezing and stridor.  Cardiovascular: Negative for chest pain, palpitations and leg swelling. Gastrointestinal: Negative for abdominal distention. Genitourinary: Negative for dysuria, urgency, frequency, hematuria, flank pain, decreased urine volume, difficulty urinating and dyspareunia.  Musculoskeletal: Negative for back pain, joint swelling, +arthralgia and gait problem. Neurological: Negative for dizziness, tremors, seizures, syncope, facial asymmetry, speech difficulty, weakness, light-headedness, numbness and headaches.  Hematological: Negative for adenopathy. Does not bruise/bleed easily. Psychiatric/Behavioral: Negative for hallucinations, behavioral problems, confusion, dysphoric mood, decreased concentration and agitation.    Objective:   Filed Vitals:   01/29/14 1001  BP: 108/71  Pulse: 72  Temp: 98.5 F (36.9 C)  Resp: 16  Physical Exam: Constitutional: Patient appears well-developed and well-nourished. No distress. HENT: Normocephalic, atraumatic, External right and left ear normal. Oropharynx is clear and moist. Left TM not congested? Sclerosed  Eyes: Conjunctivae and EOM are normal. PERRLA, no scleral icterus. Neck: Normal ROM. Neck supple. No JVD. No tracheal deviation. No thyromegaly. CVS: RRR, S1/S2 +, no murmurs, no gallops, no  carotid bruit.  Pulmonary: Effort and breath sounds normal, no stridor, rhonchi, wheezes, rales.  Abdominal: Soft. BS +, no distension, tenderness, rebound or guarding.  Musculoskeletal: Tenderness at the metacarpophalangeal and interphalangeal joint patient has difficulty making a fist..  Neuro: Alert. Normal reflexes, muscle tone coordination. No cranial nerve deficit. Skin: Skin is warm and dry. No rash noted. Not diaphoretic. No erythema. No pallor.   Lab Results  Component Value Date   WBC 8.4 01/18/2011   HGB 12.5 01/18/2011   HCT 37.4 01/18/2011   MCV 86.2 01/18/2011   PLT 424* 01/18/2011   Lab Results  Component Value Date   CREATININE 0.80 01/18/2011   BUN 16 01/18/2011   NA 138 01/18/2011   K 4.3 01/18/2011   CL 105 01/18/2011   CO2 23 01/18/2011    No results found for this basename: HGBA1C   Lipid Panel     Component Value Date/Time   CHOL 177 10/27/2009 2040   TRIG 186* 10/27/2009 2040   HDL 38* 10/27/2009 2040   CHOLHDL 4.7 Ratio 10/27/2009 2040   VLDL 37 10/27/2009 2040   LDLCALC 102* 10/27/2009 2040       Assessment and plan:   1. DM (diabetes mellitus) Results for orders placed in visit on 01/29/14  GLUCOSE, POCT (MANUAL RESULT ENTRY)      Result Value Ref Range   POC Glucose 341 (*) 70 - 99 mg/dl   patient was given aspart 10 units in clinic  Diabetes is uncontrolled I have started patient on Janumet, patient will discontinue glipizide, she'll monitor her fingerstick sugar level, follow up in 6 weeks. If fasting sugar is still elevated we'll consider adding glipizide.  - Glucose (CBG) - HgB A1c - insulin aspart (novoLOG) injection 10 Units; Inject 10 Units into the skin once. - sitaGLIPtin-metformin (JANUMET) 50-1000 MG per tablet; Take 1 tablet by mouth 2 (two) times daily with a meal.  Dispense: 60 tablet; Refill: 3 - glucose monitoring kit (FREESTYLE) monitoring kit; 1 each by Does not apply route as needed for other. Dispense any model that is covered-  dispense testing supplies for Q AC/ HS accuchecks- 1 month supply with one refil.  Dispense: 1 each; Refill: 1 - Ambulatory referral to Ophthalmology  2. Rheumatoid arthritis Following up with the rheumatologist. - naproxen (NAPROSYN) 500 MG tablet; Take 1 tablet (500 mg total) by mouth 2 (two) times daily with a meal.  Dispense: 60 tablet; Refill: 3 - folic acid (FOLVITE) 1 MG tablet; Take 1 tablet (1 mg total) by mouth daily.  Dispense: 30 tablet; Refill: 4  3. Screening Ordered baseline blood work. - MM DIGITAL SCREENING BILATERAL; Future - CBC with Differential - COMPLETE METABOLIC PANEL WITH GFR - TSH - Lipid panel - Vit D  25 hydroxy (rtn osteoporosis monitoring)  4. Hearing reduced  - Ambulatory referral to ENT  5. Special screening for malignant neoplasms, colon  - Ambulatory referral to Gastroenterology      Health Maintenance -Colonoscopy: referral done   -Mammogram: ordered    Return in about 6 weeks (around 03/12/2014) for diabetes.      Lorayne Marek, MD

## 2014-01-30 ENCOUNTER — Other Ambulatory Visit: Payer: Self-pay | Admitting: Internal Medicine

## 2014-01-30 DIAGNOSIS — D649 Anemia, unspecified: Secondary | ICD-10-CM

## 2014-01-30 LAB — VITAMIN D 25 HYDROXY (VIT D DEFICIENCY, FRACTURES): Vit D, 25-Hydroxy: 61 ng/mL (ref 30–89)

## 2014-01-31 ENCOUNTER — Telehealth: Payer: Self-pay

## 2014-01-31 NOTE — Telephone Encounter (Signed)
Interpreter line used Patient not available Message left to return our call 

## 2014-01-31 NOTE — Telephone Encounter (Signed)
Message copied by Lestine Mount on Fri Jan 31, 2014  2:19 PM ------      Message from: Doris Cheadle      Created: Thu Jan 30, 2014 10:18 AM       Blood work reviewed, noticed anemia, I have ordered anemia panel advise patient to do blood work prior to the next visit, patient has history of rheumatoid arthritis and is on methotrexate as well taking folate , anemia can be secondary to her methotrexate use, patient has already been referred to GI for screening colonoscopy. ------

## 2014-02-12 ENCOUNTER — Ambulatory Visit: Payer: No Typology Code available for payment source | Attending: Internal Medicine

## 2014-02-12 ENCOUNTER — Other Ambulatory Visit: Payer: Self-pay

## 2014-02-12 DIAGNOSIS — E119 Type 2 diabetes mellitus without complications: Secondary | ICD-10-CM

## 2014-02-12 MED ORDER — SITAGLIPTIN PHOS-METFORMIN HCL 50-1000 MG PO TABS: 1.0000 | ORAL_TABLET | Freq: Two times a day (BID) | ORAL | Status: DC

## 2014-03-10 ENCOUNTER — Encounter: Payer: Self-pay | Admitting: Internal Medicine

## 2014-04-08 ENCOUNTER — Ambulatory Visit: Payer: No Typology Code available for payment source | Attending: Internal Medicine | Admitting: Internal Medicine

## 2014-04-08 ENCOUNTER — Ambulatory Visit: Payer: No Typology Code available for payment source | Admitting: Internal Medicine

## 2014-04-08 ENCOUNTER — Encounter: Payer: Self-pay | Admitting: Internal Medicine

## 2014-04-08 VITALS — BP 100/60 | HR 67 | Temp 98.3°F | Resp 16 | Wt 161.0 lb

## 2014-04-08 DIAGNOSIS — R05 Cough: Secondary | ICD-10-CM

## 2014-04-08 DIAGNOSIS — R0981 Nasal congestion: Secondary | ICD-10-CM

## 2014-04-08 DIAGNOSIS — J329 Chronic sinusitis, unspecified: Secondary | ICD-10-CM | POA: Insufficient documentation

## 2014-04-08 DIAGNOSIS — R059 Cough, unspecified: Secondary | ICD-10-CM

## 2014-04-08 DIAGNOSIS — E119 Type 2 diabetes mellitus without complications: Secondary | ICD-10-CM | POA: Insufficient documentation

## 2014-04-08 DIAGNOSIS — J3489 Other specified disorders of nose and nasal sinuses: Secondary | ICD-10-CM

## 2014-04-08 MED ORDER — BENZONATATE 100 MG PO CAPS: 100.0000 mg | ORAL_CAPSULE | Freq: Three times a day (TID) | ORAL | Status: DC | PRN

## 2014-04-08 MED ORDER — DOXYCYCLINE HYCLATE 100 MG PO TABS: 100.0000 mg | ORAL_TABLET | Freq: Two times a day (BID) | ORAL | Status: DC

## 2014-04-08 MED ORDER — FLUTICASONE PROPIONATE 50 MCG/ACT NA SUSP: 2.0000 | Freq: Every day | NASAL | Status: DC

## 2014-04-08 NOTE — Progress Notes (Signed)
MRN: [id number] Name: Natalie Cherry  Sex: female Age: 53 y.o. DOB: Jul 19, 1961  Allergies: Review of patient's allergies indicates no known allergies.  Chief Complaint  Patient presents with  . Cough    HPI: Patient is 53 y.o. female who reported to have nasal congestion postnasal drip productive cough fever chills for the last 3 days, she has tried over-the-counter medication without much improvement complaints of some chest pain because of coughing denies any shortness of breath headache dizziness, denies smoking cigarettes.  Past Medical History  Diagnosis Date  . Hypertension   . Arthritis   . Diabetes mellitus     History reviewed. No pertinent past surgical history.    Medication List       This list is accurate as of: 04/08/14 11:09 AM.  Always use your most recent med list.               benzonatate 100 MG capsule  Commonly known as:  TESSALON  Take 1 capsule (100 mg total) by mouth 3 (three) times daily as needed for cough.     doxycycline 100 MG tablet  Commonly known as:  VIBRA-TABS  Take 1 tablet (100 mg total) by mouth 2 (two) times daily.     fluticasone 50 MCG/ACT nasal spray  Commonly known as:  FLONASE  Place 2 sprays into both nostrils daily.     folic acid 1 MG tablet  Commonly known as:  FOLVITE  Take 1 tablet (1 mg total) by mouth daily.     glucose monitoring kit monitoring kit  1 each by Does not apply route as needed for other. Dispense any model that is covered- dispense testing supplies for Q AC/ HS accuchecks- 1 month supply with one refil.     meclizine 25 MG tablet  Commonly known as:  ANTIVERT  Take 25 mg by mouth 4 (four) times daily as needed.     metFORMIN 500 MG tablet  Commonly known as:  GLUCOPHAGE  Take 500 mg by mouth 2 (two) times daily with a meal.     methotrexate 7.5 MG tablet  Commonly known as:  RHEUMATREX  Take 50 mg by mouth once a week. Caution" Chemotherapy. Protect from light.     naproxen 500 MG tablet   Commonly known as:  NAPROSYN  Take 1 tablet (500 mg total) by mouth 2 (two) times daily with a meal.     predniSONE 5 MG tablet  Commonly known as:  DELTASONE  Take 1 tablet (5 mg total) by mouth daily.     sitaGLIPtin-metformin 50-1000 MG per tablet  Commonly known as:  JANUMET  Take 1 tablet by mouth 2 (two) times daily with a meal.     traZODone 50 MG tablet  Commonly known as:  DESYREL  Take 50 mg by mouth at bedtime.        Meds ordered this encounter  Medications  . fluticasone (FLONASE) 50 MCG/ACT nasal spray    Sig: Place 2 sprays into both nostrils daily.    Dispense:  16 g    Refill:  6  . benzonatate (TESSALON) 100 MG capsule    Sig: Take 1 capsule (100 mg total) by mouth 3 (three) times daily as needed for cough.    Dispense:  30 capsule    Refill:  1  . doxycycline (VIBRA-TABS) 100 MG tablet    Sig: Take 1 tablet (100 mg total) by mouth 2 (two) times daily.    Dispense:  20 tablet    Refill:  0     There is no immunization history on file for this patient.  Family History  Problem Relation Age of Onset  . Diabetes Mother     History  Substance Use Topics  . Smoking status: Never Smoker   . Smokeless tobacco: Not on file  . Alcohol Use: No    Review of Systems   As noted in HPI  Filed Vitals:   04/08/14 1104  BP: 100/60  Pulse:   Temp:   Resp:     Physical Exam  Physical Exam  HENT:  Nasal congestion, sinus tenderness, minimal pharyngeal erythema no exudate   Eyes: EOM are normal. Pupils are equal, round, and reactive to light.  Cardiovascular: Normal rate and regular rhythm.   Pulmonary/Chest: Breath sounds normal. No respiratory distress. She has no wheezes. She has no rales.  Lymphadenopathy:    She has cervical adenopathy.    CBC    Component Value Date/Time   WBC 7.5 01/29/2014 1028   RBC 4.28 01/29/2014 1028   HGB 10.3* 01/29/2014 1028   HCT 33.3* 01/29/2014 1028   PLT 524* 01/29/2014 1028   MCV 77.8* 01/29/2014 1028    LYMPHSABS 1.1 01/29/2014 1028   MONOABS 0.3 01/29/2014 1028   EOSABS 0.0 01/29/2014 1028   BASOSABS 0.0 01/29/2014 1028    CMP     Component Value Date/Time   NA 136 01/29/2014 1028   K 4.2 01/29/2014 1028   CL 102 01/29/2014 1028   CO2 24 01/29/2014 1028   GLUCOSE 336* 01/29/2014 1028   BUN 16 01/29/2014 1028   CREATININE 0.59 01/29/2014 1028   CREATININE 0.80 01/18/2011 2040   CALCIUM 9.1 01/29/2014 1028   PROT 7.0 01/29/2014 1028   ALBUMIN 3.4* 01/29/2014 1028   AST 10 01/29/2014 1028   ALT 10 01/29/2014 1028   ALKPHOS 92 01/29/2014 1028   BILITOT 0.2 01/29/2014 1028   GFRNONAA >89 01/29/2014 1028   GFRNONAA >60 01/18/2011 2040   GFRAA >89 01/29/2014 1028   GFRAA  Value: >60        The eGFR has been calculated using the MDRD equation. This calculation has not been validated in all clinical situations. eGFR's persistently <60 mL/min signify possible Chronic Kidney Disease. 01/18/2011 2040    Lab Results  Component Value Date/Time   CHOL 160 01/29/2014 10:28 AM    No components found with this basename: hga1c    Lab Results  Component Value Date/Time   AST 10 01/29/2014 10:28 AM    Assessment and Plan  Nasal congestion - Plan: fluticasone (FLONASE) 50 MCG/ACT nasal spray Advised patient for saltwater gargles.  Sinusitis - Plan: doxycycline (VIBRA-TABS) 100 MG tablet  Cough - Plan: benzonatate (TESSALON) 100 MG capsule  Return if symptoms worsen or fail to improve.  Lorayne Marek, MD

## 2014-04-08 NOTE — Progress Notes (Signed)
Patient complains of cough, chest congestion and fever the past three days

## 2014-06-24 ENCOUNTER — Encounter: Payer: Self-pay | Admitting: Internal Medicine

## 2015-05-08 ENCOUNTER — Ambulatory Visit: Payer: Medicaid Other | Attending: Internal Medicine

## 2015-08-26 DIAGNOSIS — M069 Rheumatoid arthritis, unspecified: Secondary | ICD-10-CM | POA: Diagnosis not present

## 2015-08-26 DIAGNOSIS — Z13220 Encounter for screening for lipoid disorders: Secondary | ICD-10-CM | POA: Diagnosis not present

## 2015-08-26 DIAGNOSIS — Z79899 Other long term (current) drug therapy: Secondary | ICD-10-CM | POA: Diagnosis not present

## 2015-08-26 DIAGNOSIS — M8580 Other specified disorders of bone density and structure, unspecified site: Secondary | ICD-10-CM | POA: Diagnosis not present

## 2015-08-26 DIAGNOSIS — M059 Rheumatoid arthritis with rheumatoid factor, unspecified: Secondary | ICD-10-CM | POA: Diagnosis not present

## 2015-08-26 DIAGNOSIS — E559 Vitamin D deficiency, unspecified: Secondary | ICD-10-CM | POA: Diagnosis not present

## 2015-08-26 DIAGNOSIS — Z7952 Long term (current) use of systemic steroids: Secondary | ICD-10-CM | POA: Diagnosis not present

## 2015-09-08 ENCOUNTER — Ambulatory Visit: Payer: Medicare Other | Attending: Internal Medicine | Admitting: Internal Medicine

## 2015-09-08 ENCOUNTER — Encounter: Payer: Self-pay | Admitting: Internal Medicine

## 2015-09-08 VITALS — BP 125/78 | HR 68 | Temp 98.0°F | Resp 16 | Ht 64.0 in | Wt 164.0 lb

## 2015-09-08 DIAGNOSIS — M0600 Rheumatoid arthritis without rheumatoid factor, unspecified site: Secondary | ICD-10-CM | POA: Diagnosis not present

## 2015-09-08 DIAGNOSIS — E119 Type 2 diabetes mellitus without complications: Secondary | ICD-10-CM | POA: Diagnosis not present

## 2015-09-08 DIAGNOSIS — Z2821 Immunization not carried out because of patient refusal: Secondary | ICD-10-CM | POA: Diagnosis not present

## 2015-09-08 LAB — POCT GLYCOSYLATED HEMOGLOBIN (HGB A1C): Hemoglobin A1C: 7.3

## 2015-09-08 LAB — GLUCOSE, POCT (MANUAL RESULT ENTRY): POC Glucose: 213 mg/dl — AB (ref 70–99)

## 2015-09-08 MED ORDER — FOLIC ACID 1 MG PO TABS: 1.0000 mg | ORAL_TABLET | Freq: Every day | ORAL | Status: DC

## 2015-09-08 MED ORDER — SITAGLIPTIN PHOS-METFORMIN HCL 50-1000 MG PO TABS
1.0000 | ORAL_TABLET | Freq: Two times a day (BID) | ORAL | Status: DC
Start: 2015-09-08 — End: 2016-03-25

## 2015-09-08 MED ORDER — NAPROXEN 500 MG PO TABS: 500.0000 mg | ORAL_TABLET | Freq: Two times a day (BID) | ORAL | Status: DC

## 2015-09-08 NOTE — Progress Notes (Signed)
Virtual interpreter used Onalee Hua ID# [id number] Patient states she is just here for follow up on her diabetes and medication refills Patient was told she need an appt. In order to get her medicines

## 2015-09-08 NOTE — Progress Notes (Signed)
Patient ID: Natalie Cherry, female   DOB: Jul 28, 1961, 54 y.o.   MRN: [id number]  CC: DM f/u   HPI: Natalie Cherry is a 54 y.o. female here today for a follow up visit.  Patient has past medical history of Rheumatoid arthritis and diabetes mellitus.Patient has not been here in 1.5 years. She has continued to take Janumet without complications. She denies blurred vision, hypoglycemia, neuropathy, polyuria, or diarrhea. She checks her sugars regularly.   Patient is being followed by [facility]umatology and is on Methotrexate. She has regular follow ups and takes Naproxen as needed for pain. Pain is mostly in BUE and ankle joints.   Will need pap and mammogram. She refuses Tdap and influenza vaccines today. She is up to date on dental visits but has not had a opthalmology exam in several years.   Patient has No headache, No chest pain, No abdominal pain - No Nausea, No new weakness tingling or numbness, No Cough - SOB.  No Known Allergies Past Medical History  Diagnosis Date  . Hypertension   . Arthritis   . Diabetes mellitus    Current Outpatient Prescriptions on File Prior to Visit  Medication Sig Dispense Refill  . metFORMIN (GLUCOPHAGE) 500 MG tablet Take 500 mg by mouth 2 (two) times daily with a meal.    . naproxen (NAPROSYN) 500 MG tablet Take 1 tablet (500 mg total) by mouth 2 (two) times daily with a meal. 60 tablet 3  . sitaGLIPtin-metformin (JANUMET) 50-1000 MG per tablet Take 1 tablet by mouth 2 (two) times daily with a meal. 180 tablet 3  . fluticasone (FLONASE) 50 MCG/ACT nasal spray Place 2 sprays into both nostrils daily. 16 g 6  . folic acid (FOLVITE) 1 MG tablet Take 1 tablet (1 mg total) by mouth daily. 30 tablet 4  . glucose monitoring kit (FREESTYLE) monitoring kit 1 each by Does not apply route as needed for other. Dispense any model that is covered- dispense testing supplies for Q AC/ HS accuchecks- 1 month supply with one refil. 1 each 1  . meclizine (ANTIVERT)  25 MG tablet Take 25 mg by mouth 4 (four) times daily as needed.    . methotrexate (RHEUMATREX) 7.5 MG tablet Take 50 mg by mouth once a week. Caution" Chemotherapy. Protect from light.    . traZODone (DESYREL) 50 MG tablet Take 50 mg by mouth at bedtime.     No current facility-administered medications on file prior to visit.   Family History  Problem Relation Age of Onset  . Diabetes Mother    Social History   Social History  . Marital Status: Married    Spouse Name: N/A  . Number of Children: N/A  . Years of Education: N/A   Occupational History  . Not on file.   Social History Main Topics  . Smoking status: Never Smoker   . Smokeless tobacco: Not on file  . Alcohol Use: No  . Drug Use: No  . Sexual Activity: Not on file   Other Topics Concern  . Not on file   Social History Narrative    Review of Systems: Other than what is stated in HPI, all other systems are negative.   Objective:   Filed Vitals:   09/08/15 1511  BP: 125/78  Pulse: 68  Temp: 98 F (36.7 C)  Resp: 16    Physical Exam  Constitutional: She is oriented to person, place, and time.  Neck: No JVD present.  Cardiovascular: Normal  rate, regular rhythm and normal heart sounds.   Pulses:      Dorsalis pedis pulses are 2+ on the right side, and 2+ on the left side.  Pulmonary/Chest: Effort normal and breath sounds normal.  Musculoskeletal: She exhibits no edema.  Feet:  Right Foot:  Protective Sensation: 10 sites tested.10 sites sensed. Skin Integrity: Negative for skin breakdown.  Left Foot:  Protective Sensation: 10 sites tested. 10 sites sensed. Skin Integrity: Negative for skin breakdown.  Neurological: She is alert and oriented to person, place, and time.  Skin: Skin is warm and dry. No erythema.  Psychiatric: She has a normal mood and affect.     Lab Results  Component Value Date   WBC 7.5 01/29/2014   HGB 10.3* 01/29/2014   HCT 33.3* 01/29/2014   MCV 77.8* 01/29/2014   PLT  524* 01/29/2014   Lab Results  Component Value Date   CREATININE 0.59 01/29/2014   BUN 16 01/29/2014   NA 136 01/29/2014   K 4.2 01/29/2014   CL 102 01/29/2014   CO2 24 01/29/2014    Lab Results  Component Value Date   HGBA1C 7.30 09/08/2015   Lipid Panel     Component Value Date/Time   CHOL 160 01/29/2014 1028   TRIG 100 01/29/2014 1028   HDL 48 01/29/2014 1028   CHOLHDL 3.3 01/29/2014 1028   VLDL 20 01/29/2014 1028   LDLCALC 92 01/29/2014 1028       Assessment and plan:   [name] was seen today for follow-up.  Diagnoses and all orders for this visit:  Type 2 diabetes mellitus without complication, without long-term current use of insulin (HCC) -     Glucose (CBG) -     HgB A1c -    Refilled sitaGLIPtin-metformin (JANUMET) 50-1000 MG tablet; Take 1 tablet by mouth 2 (two) times daily with a meal. -     Ambulatory referral to Ophthalmology -     Microalbumin, urine I will obtain labs when patient returns in 2 weeks for a physical exam.  Patients diabetes is well control as evidence by consistently low a1c.  Patient will continue with current therapy and continue to make necessary lifestyle changes.  Reviewed foot care, diet, exercise, annual health maintenance with patient.   Rheumatoid arthritis with negative rheumatoid factor, involving unspecified site (HCC) -     naproxen (NAPROSYN) 500 MG tablet; Take 1 tablet (500 mg total) by mouth 2 (two) times daily with a meal. -     folic acid (FOLVITE) 1 MG tablet; Take 1 tablet (1 mg total) by mouth daily. Continue to follow up with Rheumatology   Refused influenza vaccine I have educated patient on benefits of vaccination given her immunocompromised state.    Return for 1-2 weeks pap and 3 mo PCP DM/HTN.       Lance Bosch, [facility] [phone] 09/08/2015, 3:30 PM

## 2015-09-09 LAB — MICROALBUMIN, URINE: Microalb, Ur: 0.2 mg/dL (ref <2.0)

## 2015-09-24 ENCOUNTER — Other Ambulatory Visit: Payer: Medicaid Other | Admitting: Internal Medicine

## 2015-11-06 ENCOUNTER — Other Ambulatory Visit (HOSPITAL_COMMUNITY)
Admission: RE | Admit: 2015-11-06 | Discharge: 2015-11-06 | Disposition: A | Discharge status: Home / Self Care | Payer: Medicare Other | Source: Ambulatory Visit | Attending: Internal Medicine | Admitting: Internal Medicine

## 2015-11-06 ENCOUNTER — Ambulatory Visit: Payer: Medicare Other | Attending: Internal Medicine | Admitting: Internal Medicine

## 2015-11-06 ENCOUNTER — Encounter: Payer: Self-pay | Admitting: Internal Medicine

## 2015-11-06 VITALS — BP 115/76 | HR 65 | Temp 98.0°F | Resp 16 | Ht 64.0 in | Wt 161.0 lb

## 2015-11-06 DIAGNOSIS — Z01419 Encounter for gynecological examination (general) (routine) without abnormal findings: Secondary | ICD-10-CM | POA: Diagnosis not present

## 2015-11-06 DIAGNOSIS — Z0000 Encounter for general adult medical examination without abnormal findings: Secondary | ICD-10-CM | POA: Insufficient documentation

## 2015-11-06 DIAGNOSIS — Z113 Encounter for screening for infections with a predominantly sexual mode of transmission: Secondary | ICD-10-CM | POA: Diagnosis not present

## 2015-11-06 DIAGNOSIS — Z1151 Encounter for screening for human papillomavirus (HPV): Secondary | ICD-10-CM | POA: Insufficient documentation

## 2015-11-06 DIAGNOSIS — E119 Type 2 diabetes mellitus without complications: Secondary | ICD-10-CM | POA: Diagnosis not present

## 2015-11-06 DIAGNOSIS — N760 Acute vaginitis: Secondary | ICD-10-CM | POA: Insufficient documentation

## 2015-11-06 DIAGNOSIS — M069 Rheumatoid arthritis, unspecified: Secondary | ICD-10-CM | POA: Insufficient documentation

## 2015-11-06 DIAGNOSIS — E1165 Type 2 diabetes mellitus with hyperglycemia: Secondary | ICD-10-CM | POA: Insufficient documentation

## 2015-11-06 DIAGNOSIS — I10 Essential (primary) hypertension: Secondary | ICD-10-CM | POA: Insufficient documentation

## 2015-11-06 DIAGNOSIS — Z79899 Other long term (current) drug therapy: Secondary | ICD-10-CM | POA: Insufficient documentation

## 2015-11-06 DIAGNOSIS — Z7984 Long term (current) use of oral hypoglycemic drugs: Secondary | ICD-10-CM | POA: Insufficient documentation

## 2015-11-06 DIAGNOSIS — Z1231 Encounter for screening mammogram for malignant neoplasm of breast: Secondary | ICD-10-CM | POA: Insufficient documentation

## 2015-11-06 DIAGNOSIS — E559 Vitamin D deficiency, unspecified: Secondary | ICD-10-CM | POA: Insufficient documentation

## 2015-11-06 DIAGNOSIS — Z1239 Encounter for other screening for malignant neoplasm of breast: Secondary | ICD-10-CM

## 2015-11-06 LAB — CBC WITH DIFFERENTIAL/PLATELET
BASOS PCT: 0 % (ref 0–1)
Basophils Absolute: 0 10*3/uL (ref 0.0–0.1)
EOS PCT: 3 % (ref 0–5)
Eosinophils Absolute: 0.3 10*3/uL (ref 0.0–0.7)
HEMATOCRIT: 37 % (ref 36.0–46.0)
Hemoglobin: 12.3 g/dL (ref 12.0–15.0)
LYMPHS PCT: 27 % (ref 12–46)
Lymphs Abs: 2.4 10*3/uL (ref 0.7–4.0)
MCH: 28.5 pg (ref 26.0–34.0)
MCHC: 33.2 g/dL (ref 30.0–36.0)
MCV: 85.6 fL (ref 78.0–100.0)
MONO ABS: 0.4 10*3/uL (ref 0.1–1.0)
MONOS PCT: 5 % (ref 3–12)
MPV: 9.1 fL (ref 8.6–12.4)
Neutro Abs: 5.8 10*3/uL (ref 1.7–7.7)
Neutrophils Relative %: 65 % (ref 43–77)
Platelets: 353 10*3/uL (ref 150–400)
RBC: 4.32 MIL/uL (ref 3.87–5.11)
RDW: 13.6 % (ref 11.5–15.5)
WBC: 8.9 10*3/uL (ref 4.0–10.5)

## 2015-11-06 LAB — COMPLETE METABOLIC PANEL WITH GFR
ALBUMIN: 3.7 g/dL (ref 3.6–5.1)
ALK PHOS: 171 U/L — AB (ref 33–130)
ALT: 58 U/L — ABNORMAL HIGH (ref 6–29)
AST: 33 U/L (ref 10–35)
BUN: 16 mg/dL (ref 7–25)
CALCIUM: 9.5 mg/dL (ref 8.6–10.4)
CHLORIDE: 103 mmol/L (ref 98–110)
CO2: 23 mmol/L (ref 20–31)
Creat: 0.65 mg/dL (ref 0.50–1.05)
Glucose, Bld: 179 mg/dL — ABNORMAL HIGH (ref 65–99)
POTASSIUM: 4.3 mmol/L (ref 3.5–5.3)
Sodium: 139 mmol/L (ref 135–146)
Total Bilirubin: 0.3 mg/dL (ref 0.2–1.2)
Total Protein: 7.7 g/dL (ref 6.1–8.1)

## 2015-11-06 LAB — LIPID PANEL
CHOL/HDL RATIO: 4.6 ratio (ref <=5.0)
CHOLESTEROL: 156 mg/dL (ref 125–200)
HDL: 34 mg/dL — AB (ref >=46)
LDL Cholesterol: 87 mg/dL (ref <130)
TRIGLYCERIDES: 174 mg/dL — AB (ref <150)
VLDL: 35 mg/dL — AB (ref <30)

## 2015-11-06 NOTE — Progress Notes (Signed)
Patient here for pap only. 

## 2015-11-06 NOTE — Progress Notes (Signed)
Patient ID: Natalie Cherry, female   DOB: 03/18/1961, 54 y.o.   MRN: [id number]  CC: annual physical  HPI: Natalie Cherry is a 54 y.o. female here today for a annual physical.  Patient has past medical history of HTN, rheumatoid arthritis, and Diabetes. Patient has no complaints today. She denies any vaginal discharge, itch, odor, lesions, or dysuria. She has not felt any abnormal lumps in her breast. She denies tobacco, drug, or alcohol use.    No Known Allergies Past Medical History  Diagnosis Date  . Hypertension   . Arthritis   . Diabetes mellitus    Current Outpatient Prescriptions on File Prior to Visit  Medication Sig Dispense Refill  . folic acid (FOLVITE) 1 MG tablet Take 1 tablet (1 mg total) by mouth daily. 30 tablet 4  . meclizine (ANTIVERT) 25 MG tablet Take 25 mg by mouth 4 (four) times daily as needed.    . methotrexate (RHEUMATREX) 7.5 MG tablet Take 50 mg by mouth once a week. Caution" Chemotherapy. Protect from light.    . naproxen (NAPROSYN) 500 MG tablet Take 1 tablet (500 mg total) by mouth 2 (two) times daily with a meal. 60 tablet 3  . sitaGLIPtin-metformin (JANUMET) 50-1000 MG tablet Take 1 tablet by mouth 2 (two) times daily with a meal. 180 tablet 3  . traZODone (DESYREL) 50 MG tablet Take 50 mg by mouth at bedtime.    . fluticasone (FLONASE) 50 MCG/ACT nasal spray Place 2 sprays into both nostrils daily. 16 g 6  . glucose monitoring kit (FREESTYLE) monitoring kit 1 each by Does not apply route as needed for other. Dispense any model that is covered- dispense testing supplies for Q AC/ HS accuchecks- 1 month supply with one refil. 1 each 1   No current facility-administered medications on file prior to visit.   Family History  Problem Relation Age of Onset  . Diabetes Mother    Social History   Social History  . Marital Status: Married    Spouse Name: N/A  . Number of Children: N/A  . Years of Education: N/A   Occupational History  . Not on file.   Social  History Main Topics  . Smoking status: Never Smoker   . Smokeless tobacco: Not on file  . Alcohol Use: No  . Drug Use: No  . Sexual Activity: Not on file   Other Topics Concern  . Not on file   Social History Narrative    Review of Systems: Constitutional: Negative for fever, chills, diaphoresis, activity change, appetite change and fatigue. HENT: Negative for ear pain, nosebleeds, congestion, facial swelling, rhinorrhea, neck pain, neck stiffness and ear discharge.  Eyes: Negative for pain, discharge, redness, itching and visual disturbance. Respiratory: Negative for cough, choking, chest tightness, shortness of breath, wheezing and stridor.  Cardiovascular: Negative for chest pain, palpitations and leg swelling. Gastrointestinal: Negative for abdominal distention. Genitourinary: Negative for dysuria, urgency, frequency, hematuria, flank pain, decreased urine volume, difficulty urinating and dyspareunia.  Musculoskeletal: Negative for back pain, joint swelling, arthralgias and gait problem. Neurological: Negative for dizziness, tremors, seizures, syncope, facial asymmetry, speech difficulty, weakness, light-headedness, numbness and headaches.  Hematological: Negative for adenopathy. Does not bruise/bleed easily. Psychiatric/Behavioral: Negative for hallucinations, behavioral problems, confusion, dysphoric mood, decreased concentration and agitation.    Objective:   Filed Vitals:   11/06/15 0909  BP: 115/76  Pulse: 65  Temp: 98 F (36.7 C)  Resp: 16    Physical Exam: Constitutional: Patient appears well-developed and well-nourished.  No distress. HENT: Normocephalic, atraumatic, External right and left ear normal. Oropharynx is clear and moist.  Eyes: Conjunctivae and EOM are normal. PERRLA, no scleral icterus. Neck: Normal ROM. Neck supple. No JVD. No tracheal deviation. No thyromegaly. CVS: RRR, S1/S2 +, no murmurs, no gallops, no carotid bruit.  Pulmonary: Effort and  breath sounds normal, no stridor, rhonchi, wheezes, rales.  Abdominal: Soft. BS +,  no distension, tenderness, rebound or guarding.  Musculoskeletal: Normal range of motion. No edema and no tenderness.  Lymphadenopathy: No lymphadenopathy noted, cervical, inguinal or axillary Neuro: Alert. Normal reflexes, muscle tone coordination. No cranial nerve deficit. Skin: Skin is warm and dry. No rash noted. Not diaphoretic. No erythema. No pallor. Psychiatric: Normal mood and affect. Behavior, judgment, thought content normal. Genitalia: Normal female without lesion, discharge or tenderness, NSSA, NT, no adnexal masses felt on exam  Breast: No tenderness, masses, or nipple abnormality    Lab Results  Component Value Date   WBC 7.5 01/29/2014   HGB 10.3* 01/29/2014   HCT 33.3* 01/29/2014   MCV 77.8* 01/29/2014   PLT 524* 01/29/2014   Lab Results  Component Value Date   CREATININE 0.59 01/29/2014   BUN 16 01/29/2014   NA 136 01/29/2014   K 4.2 01/29/2014   CL 102 01/29/2014   CO2 24 01/29/2014    Lab Results  Component Value Date   HGBA1C 7.30 09/08/2015   Lipid Panel     Component Value Date/Time   CHOL 160 01/29/2014 1028   TRIG 100 01/29/2014 1028   HDL 48 01/29/2014 1028   CHOLHDL 3.3 01/29/2014 1028   VLDL 20 01/29/2014 1028   LDLCALC 92 01/29/2014 1028       Assessment and plan:   Natalie Cherry was seen today for gynecologic exam.  Diagnoses and all orders for this visit:  Annual physical exam -     Cytology - PAP [place]  Breast cancer screening, high risk patient -     MM Digital Screening; Future  Type 2 diabetes mellitus without complication, without long-term current use of insulin (HCC) -     CBC with Differential -     COMPLETE METABOLIC PANEL WITH GFR -     Lipid panel Stable, continue current therapy. Will get appropriate labs today  Vitamin D deficiency -     Vitamin D, 25-hydroxy Will repeat test today  Return if symptoms worsen or fail to  improve.       Lance Bosch, [facility] [phone] 11/06/2015, 9:53 AM

## 2015-11-07 LAB — VITAMIN D 25 HYDROXY (VIT D DEFICIENCY, FRACTURES): Vit D, 25-Hydroxy: 30 ng/mL (ref 30–100)

## 2015-11-09 LAB — CERVICOVAGINAL ANCILLARY ONLY
CHLAMYDIA, DNA PROBE: NEGATIVE
NEISSERIA GONORRHEA: NEGATIVE
TRICH (WINDOWPATH): NEGATIVE
Wet Prep (BD Affirm): POSITIVE — AB

## 2015-11-09 LAB — CYTOLOGY - PAP

## 2015-11-10 ENCOUNTER — Telehealth: Payer: Self-pay | Admitting: *Deleted

## 2015-11-10 NOTE — Telephone Encounter (Signed)
Interpreter Name: Standley Brooking #:[id number]  Medical Assistant used [employer] to contact patient.   Unable to leave message. Person stated no one lives there by the patients name.

## 2015-11-16 NOTE — Telephone Encounter (Signed)
Medical Assistant used [employer] to contact patient.  Interpreter Name: Nicola Girt Interpreter #: [phone]  Interpreter unable to leave VM.

## 2015-11-16 NOTE — Telephone Encounter (Signed)
-----   Message from Ambrose Finland, NP sent at 11/11/2015  2:31 PM EST ----- Normal cytology

## 2015-12-10 ENCOUNTER — Encounter: Payer: Self-pay | Admitting: Internal Medicine

## 2015-12-10 ENCOUNTER — Ambulatory Visit: Payer: Medicare Other | Attending: Internal Medicine | Admitting: Internal Medicine

## 2015-12-10 VITALS — BP 105/63 | HR 67 | Temp 98.0°F | Resp 16 | Ht 64.0 in | Wt 164.0 lb

## 2015-12-10 DIAGNOSIS — I10 Essential (primary) hypertension: Secondary | ICD-10-CM | POA: Insufficient documentation

## 2015-12-10 DIAGNOSIS — R0981 Nasal congestion: Secondary | ICD-10-CM | POA: Insufficient documentation

## 2015-12-10 DIAGNOSIS — Z79899 Other long term (current) drug therapy: Secondary | ICD-10-CM | POA: Insufficient documentation

## 2015-12-10 DIAGNOSIS — M1990 Unspecified osteoarthritis, unspecified site: Secondary | ICD-10-CM | POA: Diagnosis not present

## 2015-12-10 DIAGNOSIS — H9203 Otalgia, bilateral: Secondary | ICD-10-CM | POA: Insufficient documentation

## 2015-12-10 DIAGNOSIS — Z7984 Long term (current) use of oral hypoglycemic drugs: Secondary | ICD-10-CM | POA: Insufficient documentation

## 2015-12-10 DIAGNOSIS — E119 Type 2 diabetes mellitus without complications: Secondary | ICD-10-CM | POA: Diagnosis not present

## 2015-12-10 MED ORDER — FLUTICASONE PROPIONATE 50 MCG/ACT NA SUSP: 2.0000 | Freq: Every day | NASAL | Status: DC

## 2015-12-10 MED FILL — FLUTICASONE PROP 50 MCG SPR: 50 | 30 days supply | Qty: 16 | Fill #0

## 2015-12-10 MED FILL — JANUMET 50-1,000 MG TABLET: 50-1000 | 30 days supply | Qty: 60 | Fill #2

## 2015-12-10 MED FILL — NAPROXEN 500 MG TABLET: 500 | 30 days supply | Qty: 60 | Fill #1

## 2015-12-10 NOTE — Progress Notes (Signed)
Patient ID: Natalie Cherry, female   DOB: Jun 24, 1961, 54 y.o.   MRN: [id number]  CC: ear pain  HPI: Natalie Cherry is a 55 y.o. female here today for a follow up visit.  Patient has past medical history of HTN, arthritis, and diabetes. Patient reports that she has been having bilateral ear pain for the past week. Sensation is described as air pushing through her ears. Pain is aggravated by swallowing. Left worse than right. She notes that when she woke up one day she felt like she could not hear and that is when the pain began. She admits to nasal/head congestion. No cough, rhinitis, eye discharge, fevers, chills.   No Known Allergies Past Medical History  Diagnosis Date  . Hypertension   . Arthritis   . Diabetes mellitus    Current Outpatient Prescriptions on File Prior to Visit  Medication Sig Dispense Refill  . fluticasone (FLONASE) 50 MCG/ACT nasal spray Place 2 sprays into both nostrils daily. 16 g 6  . folic acid (FOLVITE) 1 MG tablet Take 1 tablet (1 mg total) by mouth daily. 30 tablet 4  . glucose monitoring kit (FREESTYLE) monitoring kit 1 each by Does not apply route as needed for other. Dispense any model that is covered- dispense testing supplies for Q AC/ HS accuchecks- 1 month supply with one refil. 1 each 1  . meclizine (ANTIVERT) 25 MG tablet Take 25 mg by mouth 4 (four) times daily as needed.    . methotrexate (RHEUMATREX) 7.5 MG tablet Take 50 mg by mouth once a week. Caution" Chemotherapy. Protect from light.    . naproxen (NAPROSYN) 500 MG tablet Take 1 tablet (500 mg total) by mouth 2 (two) times daily with a meal. 60 tablet 3  . sitaGLIPtin-metformin (JANUMET) 50-1000 MG tablet Take 1 tablet by mouth 2 (two) times daily with a meal. 180 tablet 3  . traZODone (DESYREL) 50 MG tablet Take 50 mg by mouth at bedtime.     No current facility-administered medications on file prior to visit.   Family History  Problem Relation Age of Onset  . Diabetes Mother    Social History    Social History  . Marital Status: Married    Spouse Name: N/A  . Number of Children: N/A  . Years of Education: N/A   Occupational History  . Not on file.   Social History Main Topics  . Smoking status: Never Smoker   . Smokeless tobacco: Not on file  . Alcohol Use: No  . Drug Use: No  . Sexual Activity: Not on file   Other Topics Concern  . Not on file   Social History Narrative    Review of Systems: Other than what is stated in HPI, all other systems are negative.   Objective:   Filed Vitals:   12/10/15 1411  BP: 105/63  Pulse: 67  Temp: 98 F (36.7 C)  Resp: 16    Physical Exam  Constitutional: She is oriented to person, place, and time.  HENT:  Right Ear: External ear normal.  Left Ear: External ear normal.  Mouth/Throat: Oropharynx is clear and moist.  Swollen nasal turbinates  Eyes: Conjunctivae and EOM are normal. Pupils are equal, round, and reactive to light. Right eye exhibits no discharge. Left eye exhibits no discharge.  Cardiovascular: Normal rate, regular rhythm and normal heart sounds.   Pulmonary/Chest: Effort normal and breath sounds normal.  Neurological: She is alert and oriented to person, place, and time.  Skin: Skin is  warm and dry.    Lab Results  Component Value Date   WBC 8.9 11/06/2015   HGB 12.3 11/06/2015   HCT 37.0 11/06/2015   MCV 85.6 11/06/2015   PLT 353 11/06/2015   Lab Results  Component Value Date   CREATININE 0.65 11/06/2015   BUN 16 11/06/2015   NA 139 11/06/2015   K 4.3 11/06/2015   CL 103 11/06/2015   CO2 23 11/06/2015    Lab Results  Component Value Date   HGBA1C 7.30 09/08/2015   Lipid Panel     Component Value Date/Time   CHOL 156 11/06/2015 0953   TRIG 174* 11/06/2015 0953   HDL 34* 11/06/2015 0953   CHOLHDL 4.6 11/06/2015 0953   VLDL 35* 11/06/2015 0953   LDLCALC 87 11/06/2015 0953       Assessment and plan:   Natalie Cherry was seen today for ear pain.  Diagnoses and all orders for this  visit:  Nasal congestion -     fluticasone (FLONASE) 50 MCG/ACT nasal spray; Place 2 sprays into both nostrils daily.  Otalgia of both ears Patient may try OTC Claritin D to see if that will help with congestions and improve otalgia. May use tylenol as needed If no improvement may call me back in 5-7 days.   Due to language barrier, an interpreter was present during the history-taking and subsequent discussion (and for part of the physical exam) with this patient.  Return if symptoms worsen or fail to improve.       Lance Bosch, [facility] [phone] 12/10/2015, 2:26 PM

## 2015-12-10 NOTE — Patient Instructions (Signed)
Get some OTC Claritin-D to see if that helps with the ear sensation. If no improvement in 5-7 days please call me back

## 2015-12-10 NOTE — Progress Notes (Signed)
Interpreter line used Patient complains of having bilateral ear pain for the past week The left ear is the worse Feels like air pushing through them Patient states when she swallows she has trouble hearing

## 2016-01-13 MED FILL — JANUMET 50-1,000 MG TABLET: 50-1000 | 30 days supply | Qty: 60 | Fill #3

## 2016-01-13 MED FILL — FOLIC ACID 1 MG TABLET: 1 | 30 days supply | Qty: 30 | Fill #1

## 2016-01-13 MED FILL — FLUTICASONE PROP 50 MCG SPR: 50 | 30 days supply | Qty: 16 | Fill #1

## 2016-01-13 MED FILL — NAPROXEN 500 MG TABLET: 500 | 30 days supply | Qty: 60 | Fill #2

## 2016-02-09 ENCOUNTER — Ambulatory Visit: Payer: Medicare Other

## 2016-03-01 MED FILL — JANUMET 50-1,000 MG TABLET: 50-1000 | 30 days supply | Qty: 60 | Fill #4

## 2016-03-24 ENCOUNTER — Ambulatory Visit: Payer: Medicare Other

## 2016-03-25 ENCOUNTER — Encounter: Payer: Self-pay | Admitting: Clinical

## 2016-03-25 ENCOUNTER — Ambulatory Visit: Payer: Medicare Other | Attending: Internal Medicine | Admitting: Physician Assistant

## 2016-03-25 VITALS — BP 111/73 | HR 67 | Temp 98.2°F | Resp 16 | Ht 64.0 in | Wt 164.2 lb

## 2016-03-25 DIAGNOSIS — Z7984 Long term (current) use of oral hypoglycemic drugs: Secondary | ICD-10-CM | POA: Insufficient documentation

## 2016-03-25 DIAGNOSIS — R0981 Nasal congestion: Secondary | ICD-10-CM | POA: Diagnosis not present

## 2016-03-25 DIAGNOSIS — Z79899 Other long term (current) drug therapy: Secondary | ICD-10-CM | POA: Diagnosis not present

## 2016-03-25 DIAGNOSIS — H3561 Retinal hemorrhage, right eye: Secondary | ICD-10-CM | POA: Diagnosis not present

## 2016-03-25 DIAGNOSIS — M0600 Rheumatoid arthritis without rheumatoid factor, unspecified site: Secondary | ICD-10-CM | POA: Diagnosis not present

## 2016-03-25 DIAGNOSIS — H578 Other specified disorders of eye and adnexa: Secondary | ICD-10-CM | POA: Diagnosis not present

## 2016-03-25 DIAGNOSIS — H1131 Conjunctival hemorrhage, right eye: Secondary | ICD-10-CM

## 2016-03-25 DIAGNOSIS — E119 Type 2 diabetes mellitus without complications: Secondary | ICD-10-CM

## 2016-03-25 DIAGNOSIS — H5789 Other specified disorders of eye and adnexa: Secondary | ICD-10-CM

## 2016-03-25 LAB — POCT GLYCOSYLATED HEMOGLOBIN (HGB A1C): Hemoglobin A1C: 7.5

## 2016-03-25 LAB — GLUCOSE, POCT (MANUAL RESULT ENTRY): POC Glucose: 158 mg/dl — AB (ref 70–99)

## 2016-03-25 MED ORDER — FLUTICASONE PROPIONATE 50 MCG/ACT NA SUSP: 2.0000 | Freq: Every day | NASAL | Status: DC

## 2016-03-25 MED ORDER — SITAGLIPTIN PHOS-METFORMIN HCL 50-1000 MG PO TABS: 1.0000 | ORAL_TABLET | Freq: Two times a day (BID) | ORAL | Status: DC

## 2016-03-25 MED ORDER — FOLIC ACID 1 MG PO TABS: 1.0000 mg | ORAL_TABLET | Freq: Every day | ORAL | Status: DC

## 2016-03-25 MED ORDER — NAPROXEN 500 MG PO TABS: 500.0000 mg | ORAL_TABLET | Freq: Two times a day (BID) | ORAL | Status: DC

## 2016-03-25 MED FILL — FOLIC ACID 1 MG TABLET: 1 | 30 days supply | Qty: 30 | Fill #0

## 2016-03-25 NOTE — Progress Notes (Signed)
Patient's here c/o eye irritation and redness to R eye x 3 days, now.  Patient denies any pain today.  Request for medication refills.

## 2016-03-25 NOTE — Progress Notes (Signed)
Chief Complaint: Red right eye  Subjective: This is a pleasant 55 year old Hispanic female who presents today with 3 days of right eye redness. It is the outer aspect of the eye. She states no injury. She states no fall. It itches. It does not cause her pain. It is not causing her visual disturbances. She does not remember any trauma or anything being stuck in the eye. She does take Flonase for allergies.   ROS:  GEN: denies fever or chills, denies change in weight Skin: denies lesions or rashes HEENT: denies headache, earache, epistaxis, sore throat, or neck pain; redness of eye LUNGS: denies SHOB, dyspnea, PND, orthopnea NEURO: denies numbness or tingling, denies sz, stroke or TIA   Objective:  Filed Vitals:   03/25/16 0922  BP: 111/73  Pulse: 67  Temp: 98.2 F (36.8 C)  TempSrc: Oral  Resp: 16  Height: _0  (1.626 m)  Weight: 164 lb 3.2 oz (74.481 kg)  SpO2: 97%    Physical Exam:  General: in no acute distress. HEENT: no pallor, no icterus, PERRL outer aspect of right eye, blood shot red, moist oral mucosa, no JVD, no lymphadenopathy Lungs: Clear to auscultation bilaterally. Neuro: Alert, awake, oriented x3, nonfocal.   Medications: Prior to Admission medications   Medication Sig Start Date End Date Taking? Authorizing Provider  folic acid (FOLVITE) 1 MG tablet Take 1 tablet (1 mg total) by mouth daily. 03/25/16  Yes Jolane Bankhead Daneil Dan, PA-C  glucose monitoring kit (FREESTYLE) monitoring kit 1 each by Does not apply route as needed for other. Dispense any model that is covered- dispense testing supplies for Q AC/ HS accuchecks- 1 month supply with one refil. 01/29/14  Yes Lorayne Marek, MD  meclizine (ANTIVERT) 25 MG tablet Take 25 mg by mouth 4 (four) times daily as needed.   Yes Historical Provider, MD  methotrexate (RHEUMATREX) 7.5 MG tablet Take 50 mg by mouth once a week. Caution" Chemotherapy. Protect from light.   Yes Historical Provider, MD  sitaGLIPtin-metformin  (JANUMET) 50-1000 MG tablet Take 1 tablet by mouth 2 (two) times daily with a meal. 03/25/16  Yes Ilianna Bown Daneil Dan, PA-C  traZODone (DESYREL) 50 MG tablet Take 50 mg by mouth at bedtime.   Yes Historical Provider, MD  fluticasone (FLONASE) 50 MCG/ACT nasal spray Place 2 sprays into both nostrils daily. 03/25/16   Brayton Caves, PA-C  naproxen (NAPROSYN) 500 MG tablet Take 1 tablet (500 mg total) by mouth 2 (two) times daily with a meal. 03/25/16   Brayton Caves, PA-C    Assessment: 1. Right Red Eye 2. Burst blood vessel right eye    Plan: Cont OTC eye drops Reassurance, take weeks to improve 1 mo f/u for routine health maintenance If no improvement, consider opthalmology referral  Follow up:1 mo  The patient was given clear instructions to go to ER or return to medical center if symptoms don't improve, worsen or new problems develop. The patient verbalized understanding. The patient was told to call to get lab results if they haven't heard anything in the next week.   This note has been created with Surveyor, quantity. Any transcriptional errors are unintentional.   Zettie Pho, PA-C 03/25/2016, 9:30 AM

## 2016-03-25 NOTE — Progress Notes (Signed)
Depression screen [facility] 2/9 03/25/2016 12/10/2015 09/08/2015  Decreased Interest 3 1 0  Down, Depressed, Hopeless 1 1 1   PHQ - 2 Score 4 2 1   Altered sleeping 3 1 -  Tired, decreased energy 3 1 -  Change in appetite 1 0 -  Feeling bad or failure about yourself  1 0 -  Trouble concentrating 1 0 -  Moving slowly or fidgety/restless 0 1 -  Suicidal thoughts 0 0 -  PHQ-9 Score 13 5 -    GAD 7 : Generalized Anxiety Score 03/25/2016 12/10/2015  Nervous, Anxious, on Edge 1 1  Control/stop worrying 1 1  Worry too much - different things 1 1  Trouble relaxing 1 0  Restless 0 0  Easily annoyed or irritable 1 1  Afraid - awful might happen 0 0  Total GAD 7 Score 5 4

## 2016-04-04 ENCOUNTER — Ambulatory Visit: Payer: Medicare Other | Admitting: Family Medicine

## 2016-04-20 MED FILL — FOLIC ACID 1 MG TABLET: 1 | 30 days supply | Qty: 30 | Fill #2

## 2016-04-20 MED FILL — JANUMET 50-1,000 MG TABLET: 50-1000 | 30 days supply | Qty: 60 | Fill #0

## 2016-05-09 ENCOUNTER — Ambulatory Visit: Payer: Medicare Other | Admitting: Family Medicine

## 2016-05-19 MED FILL — FOLIC ACID 1 MG TABLET: 1 | 30 days supply | Qty: 30 | Fill #3

## 2016-05-19 MED FILL — NAPROXEN 500 MG TABLET: 500 | 30 days supply | Qty: 60 | Fill #0

## 2016-06-14 MED FILL — FOLIC ACID 1 MG TABLET: 1 | 30 days supply | Qty: 30 | Fill #4

## 2016-06-14 MED FILL — NAPROXEN 500 MG TABLET: 500 | 30 days supply | Qty: 60 | Fill #1

## 2016-06-17 MED FILL — JANUMET 50-1,000 MG TABLET: 50-1000 | 30 days supply | Qty: 60 | Fill #1

## 2016-07-12 ENCOUNTER — Other Ambulatory Visit: Payer: Self-pay | Admitting: Internal Medicine

## 2016-07-12 DIAGNOSIS — M0600 Rheumatoid arthritis without rheumatoid factor, unspecified site: Secondary | ICD-10-CM

## 2016-07-12 MED FILL — FOLIC ACID 1 MG TABLET: 1 | 30 days supply | Qty: 30 | Fill #0

## 2016-07-12 MED FILL — NAPROXEN 500 MG TABLET: 500 | 30 days supply | Qty: 60 | Fill #2

## 2016-07-12 MED FILL — JANUMET 50-1,000 MG TABLET: 50-1000 | 30 days supply | Qty: 60 | Fill #2

## 2016-08-09 MED FILL — JANUMET 50-1,000 MG TABLET: 50-1000 | 30 days supply | Qty: 60 | Fill #3

## 2016-08-09 MED FILL — NAPROXEN 500 MG TABLET: 500 | 30 days supply | Qty: 60 | Fill #3

## 2016-09-08 ENCOUNTER — Other Ambulatory Visit: Payer: Self-pay | Admitting: Internal Medicine

## 2016-09-08 ENCOUNTER — Other Ambulatory Visit: Payer: Self-pay | Admitting: Physician Assistant

## 2016-09-08 DIAGNOSIS — M0600 Rheumatoid arthritis without rheumatoid factor, unspecified site: Secondary | ICD-10-CM

## 2016-09-21 MED FILL — NAPROXEN 500 MG TABLET: 500 | 30 days supply | Qty: 60 | Fill #0

## 2016-09-21 MED FILL — FOLIC ACID 1 MG TABLET: 1 | 30 days supply | Qty: 30 | Fill #0

## 2016-10-07 MED FILL — JANUMET 50-1,000 MG TABLET: 50-1000 | 30 days supply | Qty: 60 | Fill #4

## 2016-10-24 ENCOUNTER — Other Ambulatory Visit: Payer: Self-pay | Admitting: Internal Medicine

## 2016-10-24 DIAGNOSIS — M0600 Rheumatoid arthritis without rheumatoid factor, unspecified site: Secondary | ICD-10-CM

## 2016-10-27 ENCOUNTER — Other Ambulatory Visit: Payer: Self-pay | Admitting: Internal Medicine

## 2016-10-27 DIAGNOSIS — M0600 Rheumatoid arthritis without rheumatoid factor, unspecified site: Secondary | ICD-10-CM

## 2016-12-13 ENCOUNTER — Ambulatory Visit: Payer: Medicare Other | Attending: Family Medicine | Admitting: Family Medicine

## 2016-12-13 ENCOUNTER — Encounter: Payer: Self-pay | Admitting: Family Medicine

## 2016-12-13 VITALS — BP 110/71 | HR 70 | Temp 97.7°F | Ht 64.0 in | Wt 164.2 lb

## 2016-12-13 DIAGNOSIS — E119 Type 2 diabetes mellitus without complications: Secondary | ICD-10-CM | POA: Insufficient documentation

## 2016-12-13 DIAGNOSIS — Z7984 Long term (current) use of oral hypoglycemic drugs: Secondary | ICD-10-CM | POA: Diagnosis not present

## 2016-12-13 DIAGNOSIS — J45909 Unspecified asthma, uncomplicated: Secondary | ICD-10-CM | POA: Diagnosis not present

## 2016-12-13 DIAGNOSIS — M069 Rheumatoid arthritis, unspecified: Secondary | ICD-10-CM | POA: Diagnosis not present

## 2016-12-13 DIAGNOSIS — R0600 Dyspnea, unspecified: Secondary | ICD-10-CM | POA: Diagnosis not present

## 2016-12-13 DIAGNOSIS — Z1159 Encounter for screening for other viral diseases: Secondary | ICD-10-CM | POA: Insufficient documentation

## 2016-12-13 DIAGNOSIS — Z114 Encounter for screening for human immunodeficiency virus [HIV]: Secondary | ICD-10-CM | POA: Diagnosis not present

## 2016-12-13 DIAGNOSIS — H9202 Otalgia, left ear: Secondary | ICD-10-CM | POA: Insufficient documentation

## 2016-12-13 LAB — COMPLETE METABOLIC PANEL WITH GFR
ALT: 17 U/L (ref 6–29)
AST: 16 U/L (ref 10–35)
Albumin: 4.1 g/dL (ref 3.6–5.1)
Alkaline Phosphatase: 107 U/L (ref 33–130)
BILIRUBIN TOTAL: 0.4 mg/dL (ref 0.2–1.2)
BUN: 11 mg/dL (ref 7–25)
CHLORIDE: 103 mmol/L (ref 98–110)
CO2: 26 mmol/L (ref 20–31)
Calcium: 9.2 mg/dL (ref 8.6–10.4)
Creat: 0.8 mg/dL (ref 0.50–1.05)
GFR, EST NON AFRICAN AMERICAN: 83 mL/min (ref >=60)
GFR, Est African American: >89 mL/min (ref >=60)
GLUCOSE: 249 mg/dL — AB (ref 65–99)
POTASSIUM: 3.9 mmol/L (ref 3.5–5.3)
SODIUM: 137 mmol/L (ref 135–146)
TOTAL PROTEIN: 7.6 g/dL (ref 6.1–8.1)

## 2016-12-13 LAB — LIPID PANEL
CHOL/HDL RATIO: 4.6 ratio (ref <5.0)
Cholesterol: 167 mg/dL (ref <200)
HDL: 36 mg/dL — ABNORMAL LOW (ref >50)
LDL CALC: 90 mg/dL (ref <100)
TRIGLYCERIDES: 207 mg/dL — AB (ref <150)
VLDL: 41 mg/dL — AB (ref <30)

## 2016-12-13 LAB — CBC
HCT: 41.7 % (ref 35.0–45.0)
Hemoglobin: 13.6 g/dL (ref 11.7–15.5)
MCH: 28.8 pg (ref 27.0–33.0)
MCHC: 32.6 g/dL (ref 32.0–36.0)
MCV: 88.3 fL (ref 80.0–100.0)
MPV: 9.3 fL (ref 7.5–12.5)
PLATELETS: 379 10*3/uL (ref 140–400)
RBC: 4.72 MIL/uL (ref 3.80–5.10)
RDW: 13.3 % (ref 11.0–15.0)
WBC: 5.1 10*3/uL (ref 3.8–10.8)

## 2016-12-13 LAB — GLUCOSE, POCT (MANUAL RESULT ENTRY): POC Glucose: 245 mg/dl — AB (ref 70–99)

## 2016-12-13 LAB — POCT GLYCOSYLATED HEMOGLOBIN (HGB A1C): Hemoglobin A1C: 11

## 2016-12-13 MED ORDER — ACCU-CHEK AVIVA PLUS W/DEVICE KIT: 1.0000 | PACK | Freq: Three times a day (TID) | 0 refills | Status: DC

## 2016-12-13 MED ORDER — GLUCOSE BLOOD VI STRP: 1.0000 | ORAL_STRIP | Freq: Three times a day (TID) | 12 refills | Status: DC

## 2016-12-13 MED ORDER — ACCU-CHEK SOFTCLIX LANCETS MISC: 1.0000 | Freq: Three times a day (TID) | 12 refills | Status: DC

## 2016-12-13 MED ORDER — SITAGLIPTIN PHOS-METFORMIN HCL 50-1000 MG PO TABS: 1.0000 | ORAL_TABLET | Freq: Two times a day (BID) | ORAL | 11 refills | Status: DC

## 2016-12-13 MED ORDER — AMOXICILLIN 500 MG PO CAPS: 500.0000 mg | ORAL_CAPSULE | Freq: Three times a day (TID) | ORAL | 0 refills | Status: DC

## 2016-12-13 MED FILL — ?AMOXICILLIN 500 MG CAPSULE: 500 | 10 days supply | Qty: 30 | Fill #0

## 2016-12-13 NOTE — Patient Instructions (Addendum)
Natalie Cherry was seen today for diabetes.  Diagnoses and all orders for this visit:  Type 2 diabetes mellitus without complication, without long-term current use of insulin (HCC) -     POCT glucose (manual entry) -     POCT glycosylated hemoglobin (Hb A1C) -     sitaGLIPtin-metformin (JANUMET) 50-1000 MG tablet; Take 1 tablet by mouth 2 (two) times daily with a meal. -     Microalbumin/Creatinine Ratio, Urine -     COMPLETE METABOLIC PANEL WITH GFR -     Lipid Panel  Dyspnea, unspecified type -     CBC  Need for hepatitis C screening test -     Hepatitis C antibody, reflex  Screening for HIV (human immunodeficiency virus) -     HIV antibody (with reflex)  Left ear pain -     amoxicillin (AMOXIL) 500 MG capsule; Take 1 capsule (500 mg total) by mouth 3 (three) times daily.   Diabetes blood sugar goals  Fasting (in AM before breakfast, 8 hrs of no eating or drinking (except water or unsweetened coffee or tea): 90-110 2 hrs after meals: < 160,   No low sugars: nothing < 70   F/u in 6 weeks for diabetes   Dr. Armen Pickup

## 2016-12-13 NOTE — Progress Notes (Signed)
Subjective:  Patient ID: Natalie Cherry, female    DOB: Dec 22, 1960  Age: 56 y.o. MRN: [id number]  CC: Diabetes   HPI Nehal Witting has rheumatoid arthritis (followed by rheumatology, treated with methotrexate)  and diabetes she presents for    1. CHRONIC DIABETES  Disease Monitoring  Blood Sugar Ranges: does not monitor   Polyuria: yes   Visual problems: no   Medication Compliance: no, no medications for 2 months   Medication Side Effects  Hypoglycemia: no   Preventitive Health Care   Eye Exam: due   Foot Exam: done today     2. Left ear pain: x 1 month. She feel pressure in her ear. With decreased hearing. No pain or hearing loss on the R side. These symptoms started after an episode of asthmatic bronchitis.    3. Shortness of breath: for one week. Occurs with rest and with exertion . Associated with cough. No chest pain. No fever or chills over the last week.   Social History  Substance Use Topics  . Smoking status: Never Smoker  . Smokeless tobacco: Not on file  . Alcohol use No    Outpatient Medications Prior to Visit  Medication Sig Dispense Refill  . fluticasone (FLONASE) 50 MCG/ACT nasal spray Place 2 sprays into both nostrils daily. 16 g 1  . folic acid (FOLVITE) 1 MG tablet TAKE 1 TABLET BY MOUTH DAILY. 30 tablet 0  . glucose monitoring kit (FREESTYLE) monitoring kit 1 each by Does not apply route as needed for other. Dispense any model that is covered- dispense testing supplies for Q AC/ HS accuchecks- 1 month supply with one refil. 1 each 1  . meclizine (ANTIVERT) 25 MG tablet Take 25 mg by mouth 4 (four) times daily as needed.    . methotrexate (RHEUMATREX) 7.5 MG tablet Take 50 mg by mouth once a week. Caution" Chemotherapy. Protect from light.    . naproxen (NAPROSYN) 500 MG tablet TAKE 1 TABLET BY MOUTH 2 TIMES DAILY WITH A MEAL. 60 tablet 0  . sitaGLIPtin-metformin (JANUMET) 50-1000 MG tablet Take 1 tablet by mouth 2 (two) times daily with a meal. 180  tablet 3  . traZODone (DESYREL) 50 MG tablet Take 50 mg by mouth at bedtime.     No facility-administered medications prior to visit.     ROS Review of Systems  Constitutional: Negative for chills and fever.  HENT: Positive for ear pain and hearing loss.   Eyes: Negative for visual disturbance.  Respiratory: Positive for shortness of breath.   Cardiovascular: Negative for chest pain.  Gastrointestinal: Negative for abdominal pain and blood in stool.  Musculoskeletal: Negative for arthralgias and back pain.  Skin: Negative for rash.  Allergic/Immunologic: Negative for immunocompromised state.  Hematological: Negative for adenopathy. Does not bruise/bleed easily.  Psychiatric/Behavioral: Negative for dysphoric mood and suicidal ideas.    Objective:  BP 110/71 (BP Location: Left Arm, Patient Position: Sitting, Cuff Size: Small)   Pulse 70   Temp 97.7 F (36.5 C) (Oral)   Ht _0  (1.626 m)   Wt 164 lb 3.2 oz (74.5 kg)   SpO2 100%   BMI 28.18 kg/m  Ambulatory pulse ox 97-100 %, pulse 80-100 bpm  BP/Weight 12/13/2016 03/25/2016 9/98/3382  Systolic BP 505 397 673  Diastolic BP 71 73 63  Wt. (Lbs) 164.2 164.2 164  BMI 28.18 28.17 28.14   Physical Exam  Constitutional: She is oriented to person, place, and time. She appears well-developed and well-nourished. No distress.  HENT:  Head: Normocephalic and atraumatic.  Right Ear: Tympanic membrane, external ear and ear canal normal.  Left Ear: Tympanic membrane is scarred and retracted.  Cardiovascular: Normal rate, regular rhythm, normal heart sounds and intact distal pulses.   Pulmonary/Chest: Effort normal and breath sounds normal.  Musculoskeletal: She exhibits no edema.  Neurological: She is alert and oriented to person, place, and time.  Skin: Skin is warm and dry. No rash noted.  Psychiatric: She has a normal mood and affect.   Lab Results  Component Value Date   HGBA1C 7.5 03/25/2016   Lab Results  Component Value  Date   HGBA1C 11 12/13/2016    CBG 245   Assessment & Plan:  Mindi was seen today for diabetes.  Diagnoses and all orders for this visit:  Type 2 diabetes mellitus without complication, without long-term current use of insulin (HCC) -     POCT glucose (manual entry) -     POCT glycosylated hemoglobin (Hb A1C) -     sitaGLIPtin-metformin (JANUMET) 50-1000 MG tablet; Take 1 tablet by mouth 2 (two) times daily with a meal. -     Microalbumin/Creatinine Ratio, Urine -     COMPLETE METABOLIC PANEL WITH GFR -     Lipid Panel -     Blood Glucose Monitoring Suppl (ACCU-CHEK AVIVA PLUS) w/Device KIT; 1 Device by Does not apply route 3 (three) times daily after meals. -     glucose blood (ACCU-CHEK AVIVA PLUS) test strip; 1 each by Other route 3 (three) times daily. -     ACCU-CHEK SOFTCLIX LANCETS lancets; 1 each by Other route 3 (three) times daily.  Dyspnea, unspecified type -     CBC  Need for hepatitis C screening test -     Hepatitis C antibody, reflex  Screening for HIV (human immunodeficiency virus) -     HIV antibody (with reflex)  Left ear pain -     amoxicillin (AMOXIL) 500 MG capsule; Take 1 capsule (500 mg total) by mouth 3 (three) times daily.   There are no diagnoses linked to this encounter.  No orders of the defined types were placed in this encounter.   Follow-up: Return in about 6 weeks (around 01/24/2017) for diabetes .   Boykin Nearing MD

## 2016-12-13 NOTE — Assessment & Plan Note (Signed)
Uncontrolled in setting of med non compliance Restart januemt Home CBG monitoring Add basal insulin if CBGs remain above goal on janumet

## 2016-12-14 LAB — HIV ANTIBODY (ROUTINE TESTING W REFLEX): HIV: NONREACTIVE

## 2016-12-14 LAB — MICROALBUMIN / CREATININE URINE RATIO
Creatinine, Urine: 268 mg/dL (ref 20–320)
MICROALB UR: 2.7 mg/dL
MICROALB/CREAT RATIO: 10 ug/mg{creat} (ref <30)

## 2016-12-14 LAB — HEPATITIS C ANTIBODY: HCV AB: NEGATIVE

## 2016-12-19 MED ORDER — ATORVASTATIN CALCIUM 20 MG PO TABS: 20.0000 mg | ORAL_TABLET | Freq: Every day | ORAL | 11 refills | Status: DC

## 2016-12-19 MED FILL — ATORVASTATIN 20 MG TABLET: 20 | 30 days supply | Qty: 30 | Fill #0

## 2016-12-19 NOTE — Addendum Note (Signed)
Addended by: Dessa Phi on: 12/19/2016 08:12 AM   Modules accepted: Orders

## 2016-12-21 ENCOUNTER — Other Ambulatory Visit: Payer: Self-pay | Admitting: Internal Medicine

## 2016-12-21 ENCOUNTER — Telehealth: Payer: Self-pay

## 2016-12-21 DIAGNOSIS — M0600 Rheumatoid arthritis without rheumatoid factor, unspecified site: Secondary | ICD-10-CM

## 2016-12-21 MED FILL — JANUMET 50-1,000 MG TABLET: 50-1000 | 30 days supply | Qty: 60 | Fill #0

## 2016-12-21 MED FILL — FOLIC ACID 1 MG TABLET: 1 | 30 days supply | Qty: 30 | Fill #1

## 2016-12-21 NOTE — Telephone Encounter (Signed)
Pt was called and a VM was left informing pt to return phone call for lab results. 

## 2016-12-22 MED FILL — NAPROXEN 500 MG TABLET: 500 | 30 days supply | Qty: 60 | Fill #0

## 2016-12-26 ENCOUNTER — Telehealth: Payer: Self-pay

## 2016-12-26 NOTE — Telephone Encounter (Signed)
Pt was called and informed of lab results and medication being sent over to her pharmacy. 

## 2017-01-06 ENCOUNTER — Other Ambulatory Visit: Payer: Self-pay

## 2017-01-06 DIAGNOSIS — E119 Type 2 diabetes mellitus without complications: Secondary | ICD-10-CM

## 2017-01-06 MED ORDER — SITAGLIPTIN PHOS-METFORMIN HCL 50-1000 MG PO TABS: 1.0000 | ORAL_TABLET | Freq: Two times a day (BID) | ORAL | 3 refills | Status: DC

## 2017-01-16 ENCOUNTER — Ambulatory Visit: Payer: Medicare Other | Attending: Family Medicine | Admitting: Physician Assistant

## 2017-01-16 VITALS — BP 102/63 | HR 86 | Temp 98.1°F | Resp 16 | Wt 167.8 lb

## 2017-01-16 DIAGNOSIS — Z7984 Long term (current) use of oral hypoglycemic drugs: Secondary | ICD-10-CM | POA: Diagnosis not present

## 2017-01-16 DIAGNOSIS — E119 Type 2 diabetes mellitus without complications: Secondary | ICD-10-CM | POA: Diagnosis not present

## 2017-01-16 DIAGNOSIS — Z79899 Other long term (current) drug therapy: Secondary | ICD-10-CM | POA: Insufficient documentation

## 2017-01-16 DIAGNOSIS — R0981 Nasal congestion: Secondary | ICD-10-CM | POA: Diagnosis not present

## 2017-01-16 DIAGNOSIS — J069 Acute upper respiratory infection, unspecified: Secondary | ICD-10-CM | POA: Insufficient documentation

## 2017-01-16 DIAGNOSIS — H9312 Tinnitus, left ear: Secondary | ICD-10-CM

## 2017-01-16 LAB — POCT GLYCOSYLATED HEMOGLOBIN (HGB A1C): HEMOGLOBIN A1C: 9.8

## 2017-01-16 LAB — GLUCOSE, POCT (MANUAL RESULT ENTRY): POC GLUCOSE: 240 mg/dL — AB (ref 70–99)

## 2017-01-16 MED ORDER — FLUTICASONE PROPIONATE 50 MCG/ACT NA SUSP: 2.0000 | Freq: Every day | NASAL | 1 refills | Status: DC

## 2017-01-16 MED ORDER — DM-GUAIFENESIN ER 30-600 MG PO TB12: 1.0000 | ORAL_TABLET | Freq: Two times a day (BID) | ORAL | 0 refills | Status: DC

## 2017-01-16 MED FILL — FLUTICASONE PROP 50 MCG SPR: 50 | 30 days supply | Qty: 16 | Fill #0

## 2017-01-16 NOTE — Patient Instructions (Signed)
Check blood sugars at least 2-3times daily and record and bring to your next visit

## 2017-01-16 NOTE — Progress Notes (Signed)
Patient ID: Natalie Cherry, female   DOB: 1961-11-11, 56 y.o.   MRN: [id number] .      Natalie Cherry, is a 56 y.o. female  [id number]  [id number]  DOB - September 03, 1961  Subjective:  Chief Complaint and HPI: Natalie Cherry is a 56 y.o. female here today C/o L ear pain and congestion with a sound of roaring for more than 1 month now.  She also feels like her hearing is deteriorating on the L side.    Treated with a course of amoxicillin last month.    Also c/o sinus congestion and some non-productive cough X 4 days.  No f/c.  No SOB/no CP.  Takes diabetes meds as directed.  She does not check her blood sugars at home.  [employer] interpreters "Cristian" interpreting.    ROS:   Constitutional:  No f/c, No night sweats, No unexplained weight loss. EENT:  No vision changes, No blurry vision, + hearing changes L ear only. No mouth, throat, or other ear problems. + nasal congestion Respiratory: + cough, No SOB Cardiac: No CP, no palpitations GI:  No abd pain, No N/V/D. GU: No Urinary s/sx Musculoskeletal: No joint pain Neuro: No headache, no dizziness, no motor weakness.  Skin: No rash Endocrine:  No polydipsia. No polyuria.  Psych: Denies SI/HI  No problems updated.  ALLERGIES: No Known Allergies  PAST MEDICAL HISTORY: Past Medical History:  Diagnosis Date  . Arthritis   . Diabetes mellitus   . Hypertension     MEDICATIONS AT HOME: Prior to Admission medications   Medication Sig Start Date End Date Taking? Authorizing Provider  ACCU-CHEK SOFTCLIX LANCETS lancets 1 each by Other route 3 (three) times daily. 12/13/16   Josalyn Funches, MD  amoxicillin (AMOXIL) 500 MG capsule Take 1 capsule (500 mg total) by mouth 3 (three) times daily. 12/13/16   Josalyn Funches, MD  atorvastatin (LIPITOR) 20 MG tablet Take 1 tablet (20 mg total) by mouth daily. 12/19/16   Josalyn Funches, MD  Blood Glucose Monitoring Suppl (ACCU-CHEK AVIVA PLUS) w/Device KIT 1 Device by Does not apply route 3  (three) times daily after meals. 12/13/16   Josalyn Funches, MD  dextromethorphan-guaiFENesin (MUCINEX DM) 30-600 MG 12hr tablet Take 1 tablet by mouth 2 (two) times daily. Prn cough 01/16/17   Argentina Donovan, PA-C  fluticasone (FLONASE) 50 MCG/ACT nasal spray Place 2 sprays into both nostrils daily. 01/16/17   Argentina Donovan, PA-C  folic acid (FOLVITE) 1 MG tablet TAKE 1 TABLET BY MOUTH DAILY. 09/08/16   Olugbemiga Essie Christine, MD  glucose blood (ACCU-CHEK AVIVA PLUS) test strip 1 each by Other route 3 (three) times daily. 12/13/16   Boykin Nearing, MD  meclizine (ANTIVERT) 25 MG tablet Take 25 mg by mouth 4 (four) times daily as needed.    Historical Provider, MD  methotrexate (RHEUMATREX) 7.5 MG tablet Take 50 mg by mouth once a week. Caution" Chemotherapy. Protect from light.    Historical Provider, MD  naproxen (NAPROSYN) 500 MG tablet TAKE 1 TABLET BY MOUTH 2 TIMES DAILY WITH A MEAL. 12/22/16   Boykin Nearing, MD  sitaGLIPtin-metformin (JANUMET) 50-1000 MG tablet Take 1 tablet by mouth 2 (two) times daily with a meal. 01/06/17   Boykin Nearing, MD  traZODone (DESYREL) 50 MG tablet Take 50 mg by mouth at bedtime.    Historical Provider, MD     Objective:  EXAM:   Vitals:   01/16/17 1703  BP: 102/63  Pulse: 86  Resp: 16  Temp:  98.1 F (36.7 C)  TempSrc: Oral  SpO2: 98%  Weight: 167 lb 12.8 oz (76.1 kg)    General appearance : A&OX3. NAD. Non-toxic-appearing HEENT: Atraumatic and Normocephalic.  PERRLA. EOM intact.  TM with scarring B but no visible abnormality of either.. Mouth-MMM, post pharynx WNL w/o erythema, No PND. Neck: supple, no JVD. No cervical lymphadenopathy. No thyromegaly Chest/Lungs:  Breathing-non-labored, Good air entry bilaterally, breath sounds normal without rales, rhonchi, or wheezing  CVS: S1 S2 regular, no murmurs, gallops, rubs  Extremities: Bilateral Lower Ext shows no edema, both legs are warm to touch with = pulse throughout Neurology:  CN II-XII grossly  intact, Non focal.   Psych:  TP linear. J/I WNL. Normal speech. Appropriate eye contact and affect.  Skin:  No Rash  Data Review Lab Results  Component Value Date   HGBA1C 9.8 01/16/2017   HGBA1C 11 12/13/2016   HGBA1C 7.5 03/25/2016     Assessment & Plan   1. Type 2 diabetes mellitus without complication, without long-term current use of insulin (HCC) Uncontrolled.  Work on diet and increase water intake.  Check blood sugars at least 2-3times daily and record and bring to your next visi - POCT glucose (manual entry) - POCT glycosylated hemoglobin (Hb A1C) Continue current regimen  2. Nasal congestion/URI Mucinex DM - fluticasone (FLONASE) 50 MCG/ACT nasal spray; Place 2 sprays into both nostrils daily.  Dispense: 16 g; Refill: 1  3. Tinnitus of left ear X 6+ weeks without resolution after antibiotic treatment - Ambulatory referral to ENT  Patient have been counseled extensively about nutrition and exercise  Return in about 5 weeks (around 02/20/2017) for Dr Adrian Blackwater; f/up DM.  The patient was given clear instructions to go to ER or return to medical center if symptoms don't improve, worsen or new problems develop. The patient verbalized understanding. The patient was told to call to get lab results if they haven't heard anything in the next week.     Freeman Caldron, PA-C [facility] and [facility], [place]   01/16/2017, 5:27 PM

## 2017-02-01 ENCOUNTER — Other Ambulatory Visit: Payer: Self-pay | Admitting: Family Medicine

## 2017-02-01 DIAGNOSIS — M0600 Rheumatoid arthritis without rheumatoid factor, unspecified site: Secondary | ICD-10-CM

## 2017-02-01 MED FILL — FOLIC ACID 1 MG TABLET: 1 | 30 days supply | Qty: 30 | Fill #2

## 2017-02-01 MED FILL — JANUMET 50-1,000 MG TABLET: 50-1000 | 30 days supply | Qty: 60 | Fill #1

## 2017-02-06 MED FILL — NAPROXEN 500 MG TABLET: 500 | 30 days supply | Qty: 60 | Fill #0

## 2017-03-07 ENCOUNTER — Other Ambulatory Visit: Payer: Self-pay | Admitting: Family Medicine

## 2017-03-07 DIAGNOSIS — M0600 Rheumatoid arthritis without rheumatoid factor, unspecified site: Secondary | ICD-10-CM

## 2017-03-07 MED FILL — FOLIC ACID 1 MG TABLET: 1 | 30 days supply | Qty: 30 | Fill #3

## 2017-03-07 MED FILL — ATORVASTATIN 20 MG TABLET: 20 | 30 days supply | Qty: 30 | Fill #1

## 2017-03-07 MED FILL — JANUMET 50-1,000 MG TABLET: 50-1000 | 30 days supply | Qty: 60 | Fill #2

## 2017-03-07 MED FILL — FLUTICASONE PROP 50 MCG SPR: 50 | 30 days supply | Qty: 16 | Fill #1

## 2017-03-07 MED FILL — NAPROXEN 500 MG TABLET: 500 | 30 days supply | Qty: 60 | Fill #0

## 2017-04-07 ENCOUNTER — Other Ambulatory Visit: Payer: Self-pay | Admitting: Physician Assistant

## 2017-04-07 ENCOUNTER — Other Ambulatory Visit: Payer: Self-pay | Admitting: Family Medicine

## 2017-04-07 DIAGNOSIS — M0600 Rheumatoid arthritis without rheumatoid factor, unspecified site: Secondary | ICD-10-CM

## 2017-04-12 ENCOUNTER — Encounter: Payer: Self-pay | Admitting: Family Medicine

## 2017-04-18 MED FILL — NAPROXEN 500 MG TABLET: 500 | 30 days supply | Qty: 60 | Fill #0

## 2017-04-18 MED FILL — FOLIC ACID 1 MG TABLET: 1 | 30 days supply | Qty: 30 | Fill #0

## 2017-04-18 MED FILL — ATORVASTATIN 20 MG TABLET: 20 | 30 days supply | Qty: 30 | Fill #2

## 2017-04-28 DIAGNOSIS — R1013 Epigastric pain: Secondary | ICD-10-CM | POA: Diagnosis not present

## 2017-04-28 DIAGNOSIS — E119 Type 2 diabetes mellitus without complications: Secondary | ICD-10-CM | POA: Diagnosis not present

## 2017-04-28 DIAGNOSIS — D649 Anemia, unspecified: Secondary | ICD-10-CM | POA: Diagnosis not present

## 2017-04-28 DIAGNOSIS — K8012 Calculus of gallbladder with acute and chronic cholecystitis without obstruction: Secondary | ICD-10-CM | POA: Diagnosis not present

## 2017-04-28 DIAGNOSIS — E785 Hyperlipidemia, unspecified: Secondary | ICD-10-CM | POA: Diagnosis not present

## 2017-04-28 DIAGNOSIS — E1165 Type 2 diabetes mellitus with hyperglycemia: Secondary | ICD-10-CM | POA: Diagnosis not present

## 2017-04-28 DIAGNOSIS — R1114 Bilious vomiting: Secondary | ICD-10-CM | POA: Diagnosis not present

## 2017-04-28 DIAGNOSIS — K8000 Calculus of gallbladder with acute cholecystitis without obstruction: Secondary | ICD-10-CM | POA: Diagnosis not present

## 2017-04-28 DIAGNOSIS — M1990 Unspecified osteoarthritis, unspecified site: Secondary | ICD-10-CM | POA: Diagnosis present

## 2017-04-28 DIAGNOSIS — R05 Cough: Secondary | ICD-10-CM | POA: Diagnosis not present

## 2017-04-28 DIAGNOSIS — R509 Fever, unspecified: Secondary | ICD-10-CM | POA: Diagnosis present

## 2017-04-28 DIAGNOSIS — K851 Biliary acute pancreatitis without necrosis or infection: Secondary | ICD-10-CM | POA: Diagnosis not present

## 2017-04-28 DIAGNOSIS — Z7984 Long term (current) use of oral hypoglycemic drugs: Secondary | ICD-10-CM | POA: Diagnosis not present

## 2017-04-28 DIAGNOSIS — R0602 Shortness of breath: Secondary | ICD-10-CM | POA: Diagnosis not present

## 2017-04-28 DIAGNOSIS — M069 Rheumatoid arthritis, unspecified: Secondary | ICD-10-CM | POA: Diagnosis not present

## 2017-05-10 ENCOUNTER — Ambulatory Visit: Payer: Medicare Other | Attending: Family Medicine | Admitting: Physician Assistant

## 2017-05-10 VITALS — BP 95/63 | HR 71 | Temp 99.0°F | Resp 16 | Wt 156.0 lb

## 2017-05-10 DIAGNOSIS — R0981 Nasal congestion: Secondary | ICD-10-CM | POA: Diagnosis not present

## 2017-05-10 DIAGNOSIS — M0600 Rheumatoid arthritis without rheumatoid factor, unspecified site: Secondary | ICD-10-CM

## 2017-05-10 DIAGNOSIS — E119 Type 2 diabetes mellitus without complications: Secondary | ICD-10-CM | POA: Diagnosis not present

## 2017-05-10 DIAGNOSIS — M069 Rheumatoid arthritis, unspecified: Secondary | ICD-10-CM | POA: Diagnosis not present

## 2017-05-10 DIAGNOSIS — Z9049 Acquired absence of other specified parts of digestive tract: Secondary | ICD-10-CM | POA: Insufficient documentation

## 2017-05-10 DIAGNOSIS — Z79899 Other long term (current) drug therapy: Secondary | ICD-10-CM | POA: Diagnosis not present

## 2017-05-10 DIAGNOSIS — M479 Spondylosis, unspecified: Secondary | ICD-10-CM | POA: Insufficient documentation

## 2017-05-10 DIAGNOSIS — Z7984 Long term (current) use of oral hypoglycemic drugs: Secondary | ICD-10-CM | POA: Diagnosis not present

## 2017-05-10 DIAGNOSIS — I10 Essential (primary) hypertension: Secondary | ICD-10-CM | POA: Insufficient documentation

## 2017-05-10 LAB — POCT GLYCOSYLATED HEMOGLOBIN (HGB A1C): Hemoglobin A1C: 6.6

## 2017-05-10 LAB — GLUCOSE, POCT (MANUAL RESULT ENTRY): POC GLUCOSE: 138 mg/dL — AB (ref 70–99)

## 2017-05-10 MED ORDER — NAPROXEN 500 MG PO TABS: ORAL_TABLET | ORAL | 0 refills | Status: DC

## 2017-05-10 MED ORDER — SITAGLIPTIN PHOS-METFORMIN HCL 50-1000 MG PO TABS: 1.0000 | ORAL_TABLET | Freq: Two times a day (BID) | ORAL | 3 refills | Status: DC

## 2017-05-10 MED ORDER — FLUTICASONE PROPIONATE 50 MCG/ACT NA SUSP: 2.0000 | Freq: Every day | NASAL | 1 refills | Status: DC

## 2017-05-10 MED ORDER — METHOTREXATE SODIUM 7.5 MG PO TABS: 50.0000 mg | ORAL_TABLET | ORAL | 3 refills | Status: DC

## 2017-05-10 MED FILL — JANUMET 50-1,000 MG TABLET: 50-1000 | 90 days supply | Qty: 180 | Fill #0

## 2017-05-10 MED FILL — FLUTICASONE PROP 50 MCG SPR: 50 | 30 days supply | Qty: 16 | Fill #0

## 2017-05-10 NOTE — Progress Notes (Signed)
Patient ID: Natalie Cherry, female   DOB: 1961-09-03, 56 y.o.   MRN: [id number]     Natalie Cherry, is a 56 y.o. female  [id number]  [id number]  DOB - 11/17/1961  Subjective:  Chief Complaint and HPI: Natalie Cherry is a 56 y.o. female here today for a follow up visit after being Admitted April 28, 2017 to [facility] in [place] for acute biliary pancreatitis and acute cholecystitis.  She had cholecystectomy on May 01, 2017.  She was discharged on 05/04/2017. She is here today for f/up.  She still has a drainage tube in place that needs to be removed.  She denies F/C.  Her daughter Natalie Cherry brought her today and is translating.  Bowels moving normally.  Pain relieved by ibuprofen once daily.  No N/V/D.  She brought the discharge summary which is in [place].  She is also due for diabetes check and needs meds RF.  She is also requesting a handicap placard. Blood sugars have been fairly well controlled.  She is compliant with her meds.   ROS:   Constitutional:  No f/c, No night sweats, No unexplained weight loss. EENT:  No vision changes, No blurry vision, No hearing changes. No mouth, throat, or ear problems.  Respiratory: No cough, No SOB Cardiac: No CP, no palpitations GI:  resolving abd pain, No N/V/D. GU: No Urinary s/sx Musculoskeletal: No joint pain Neuro: No headache, no dizziness, no motor weakness.  Skin: No rash Endocrine:  No polydipsia. No polyuria.  Psych: Denies SI/HI  Problem  Osteoarthritis of Spine   Qualifier: Diagnosis of  By: Arletha Pili       ALLERGIES: No Known Allergies  PAST MEDICAL HISTORY: Past Medical History:  Diagnosis Date  . Arthritis   . Diabetes mellitus   . Hypertension     MEDICATIONS AT HOME: Prior to Admission medications   Medication Sig Start Date End Date Taking? Authorizing Provider  ACCU-CHEK SOFTCLIX LANCETS lancets 1 each by Other route 3 (three) times daily. 12/13/16   Funches, Adriana Mccallum, MD  atorvastatin (LIPITOR) 20  MG tablet Take 1 tablet (20 mg total) by mouth daily. 12/19/16   Funches, Adriana Mccallum, MD  Blood Glucose Monitoring Suppl (ACCU-CHEK AVIVA PLUS) w/Device KIT 1 Device by Does not apply route 3 (three) times daily after meals. 12/13/16   Funches, Adriana Mccallum, MD  dextromethorphan-guaiFENesin (MUCINEX DM) 30-600 MG 12hr tablet Take 1 tablet by mouth 2 (two) times daily. Prn cough 01/16/17   McClung, Dionne Bucy, PA-C  fluticasone (FLONASE) 50 MCG/ACT nasal spray Place 2 sprays into both nostrils daily. 05/10/17   Argentina Donovan, PA-C  folic acid (FOLVITE) 1 MG tablet TAKE 1 TABLET BY MOUTH DAILY. 04/07/17   Tresa Garter, MD  glucose blood (ACCU-CHEK AVIVA PLUS) test strip 1 each by Other route 3 (three) times daily. 12/13/16   Funches, Adriana Mccallum, MD  methotrexate (RHEUMATREX) 7.5 MG tablet Take 7 tablets (52.5 mg total) by mouth once a week. Caution" Chemotherapy. Protect from light. 05/10/17   Argentina Donovan, PA-C  naproxen (NAPROSYN) 500 MG tablet TAKE 1 TABLET BY MOUTH 2 TIMES DAILY WITH A MEAL prn pain 05/10/17   Freeman Caldron M, PA-C  sitaGLIPtin-metformin (JANUMET) 50-1000 MG tablet Take 1 tablet by mouth 2 (two) times daily with a meal. 05/10/17   McClung, Dionne Bucy, PA-C  traZODone (DESYREL) 50 MG tablet Take 50 mg by mouth at bedtime.    [provider]     Objective:  EXAM:  Vitals:   05/10/17 1459  BP: 95/63  Pulse: 71  Resp: 16  Temp: 99 F (37.2 C)  TempSrc: Oral  SpO2: 95%  Weight: 156 lb (70.8 kg)    General appearance : A&OX3. NAD. Non-toxic-appearing HEENT: Atraumatic and Normocephalic.  PERRLA. EOM intact.  TM clear B. Mouth-MMM, post pharynx WNL w/o erythema, No PND. Neck: supple, no JVD. No cervical lymphadenopathy. No thyromegaly Chest/Lungs:  Breathing-non-labored, Good air entry bilaterally, breath sounds normal without rales, rhonchi, or wheezing  CVS: S1 S2 regular, no murmurs, gallops, rubs  Abdomen: Bowel sounds present, Non tender and not distended with  no gaurding, rigidity or rebound.  Drainage tube in place.  There is no erythema around the insertion site of drainage tube.  There is only a small amount of clear yellow fluid in the tube and almost none in the reservoir.  Her abdomen is soft with normal bowel sounds. Extremities: Bilateral Lower Ext shows no edema, both legs are warm to touch with = pulse throughout Neurology:  CN II-XII grossly intact, Non focal.   Psych:  TP linear. J/I WNL. Normal speech. Appropriate eye contact and affect.  Skin:  No Rash  Data Review Lab Results  Component Value Date   HGBA1C 6.6 05/10/2017   HGBA1C 9.8 01/16/2017   HGBA1C 11 12/13/2016     Assessment & Plan   1. Type 2 diabetes mellitus without complication, without long-term current use of insulin (HCC) Adequate control but could improve with better diet/exercise - Glucose (CBG) - HgB A1c - Comprehensive metabolic panel continue- sitaGLIPtin-metformin (JANUMET) 50-1000 MG tablet; Take 1 tablet by mouth 2 (two) times daily with a meal.  Dispense: 180 tablet; Refill: 3  2. Rheumatoid arthritis, involving unspecified site, unspecified rheumatoid factor presence ([place]) Filled meds.  Handicap placard filed out - naproxen (NAPROSYN) 500 MG tablet; TAKE 1 TABLET BY MOUTH 2 TIMES DAILY WITH A MEAL prn pain  Dispense: 60 tablet; Refill: 0  3. Osteoarthritis of spine, unspecified spinal osteoarthritis complication status, unspecified spinal region Handicap placard  4. History of cholecystectomy 05/01/2017-healing well. - Ambulatory referral to General Surgery-needs drainage tube removed ASAP  5. Nasal congestion - fluticasone (FLONASE) 50 MCG/ACT nasal spray; Place 2 sprays into both nostrils daily.  Dispense: 16 g; Refill: 1  Patient have been counseled extensively about nutrition and exercise  Return in about 3 months (around 08/10/2017) for assign new PCP; f/up DM/RA.  The patient was given clear instructions to go to ER or return to medical  center if symptoms don't improve, worsen or new problems develop. The patient verbalized understanding. The patient was told to call to get lab results if they haven't heard anything in the next week.     Freeman Caldron, PA-C [facility] and [place], [place]   05/10/2017, 3:23 PM

## 2017-05-11 ENCOUNTER — Other Ambulatory Visit: Payer: Self-pay | Admitting: Pharmacist

## 2017-05-11 LAB — COMPREHENSIVE METABOLIC PANEL
A/G RATIO: 1.1 — AB (ref 1.2–2.2)
ALT: 15 IU/L (ref 0–32)
AST: 19 IU/L (ref 0–40)
Albumin: 4.1 g/dL (ref 3.5–5.5)
Alkaline Phosphatase: 131 IU/L — ABNORMAL HIGH (ref 39–117)
BUN/Creatinine Ratio: 13 (ref 9–23)
BUN: 9 mg/dL (ref 6–24)
CHLORIDE: 100 mmol/L (ref 96–106)
CO2: 23 mmol/L (ref 20–29)
Calcium: 9.6 mg/dL (ref 8.7–10.2)
Creatinine, Ser: 0.67 mg/dL (ref 0.57–1.00)
GFR calc non Af Amer: 99 mL/min/{1.73_m2} (ref >59)
GFR, EST AFRICAN AMERICAN: 114 mL/min/{1.73_m2} (ref >59)
Globulin, Total: 3.7 g/dL (ref 1.5–4.5)
Glucose: 114 mg/dL — ABNORMAL HIGH (ref 65–99)
Potassium: 4.8 mmol/L (ref 3.5–5.2)
SODIUM: 137 mmol/L (ref 134–144)
TOTAL PROTEIN: 7.8 g/dL (ref 6.0–8.5)

## 2017-05-11 MED ORDER — METHOTREXATE 2.5 MG PO TABS: 20.0000 mg | ORAL_TABLET | ORAL | 0 refills | Status: DC

## 2017-05-11 MED FILL — METHOTREXATE 2.5 MG TABLET: 2.5 | 30 days supply | Qty: 32 | Fill #0

## 2017-05-11 NOTE — Telephone Encounter (Signed)
Reviewed patient's chart from [facility]. She was dismissed from rheumatology early 2017 for noncompliance with visits. Patient's last dose of methotrexate was 20 mg weekly. The 50 mg documented in our system is inaccurate. Discussed with Dr. Hyman Hopes and we will change the dose and refer to rheumatology.

## 2017-05-29 DIAGNOSIS — Z9049 Acquired absence of other specified parts of digestive tract: Secondary | ICD-10-CM | POA: Diagnosis not present

## 2017-05-30 ENCOUNTER — Telehealth: Payer: Self-pay

## 2017-05-30 NOTE — Telephone Encounter (Signed)
CMA call regarding lab results  Patient did not answer daughter did but since there was no DPR I dint share the results just stated the reason of the call & for the patient to  call back

## 2017-05-30 NOTE — Telephone Encounter (Signed)
-----   Message from Margaretmary Lombard, [place] sent at 05/29/2017  5:06 PM EDT ----- Please inform patient of labs being within normal limits. Follow up as planned ----- Message ----- From: Anders Simmonds, PA-C Sent: 05/12/2017   9:24 AM To: Margaretmary Lombard, CMA  Please call patient.  Her labs are WNL-esp for just having surgery.  Follow-up as planned.  Thanks, Georgian Co, PA-C

## 2017-08-17 ENCOUNTER — Other Ambulatory Visit: Payer: Self-pay | Admitting: Internal Medicine

## 2017-08-17 ENCOUNTER — Ambulatory Visit: Payer: Medicare Other | Admitting: Family Medicine

## 2017-08-17 DIAGNOSIS — M0600 Rheumatoid arthritis without rheumatoid factor, unspecified site: Secondary | ICD-10-CM

## 2017-08-17 MED FILL — ATORVASTATIN 20 MG TABLET: 20 | 90 days supply | Qty: 90 | Fill #3

## 2017-08-17 MED FILL — JANUMET 50-1,000 MG TABLET: 50-1000 | 90 days supply | Qty: 180 | Fill #1

## 2017-08-17 MED FILL — FOLIC ACID 1 MG TABLET: 1 | 30 days supply | Qty: 30 | Fill #1

## 2017-08-17 MED FILL — NAPROXEN 500 MG TABLET: 500 | 30 days supply | Qty: 60 | Fill #0

## 2017-09-07 ENCOUNTER — Ambulatory Visit: Payer: Medicare Other | Admitting: Family Medicine

## 2017-11-15 MED FILL — ATORVASTATIN 20 MG TABLET: 20 | 90 days supply | Qty: 90 | Fill #4

## 2017-11-15 MED FILL — JANUMET 50-1,000 MG TABLET: 50-1000 | 90 days supply | Qty: 180 | Fill #2

## 2017-11-15 MED FILL — FOLIC ACID 1 MG TABLET: 1 | 30 days supply | Qty: 30 | Fill #2

## 2018-03-21 ENCOUNTER — Ambulatory Visit: Payer: Medicare Other | Attending: Family Medicine | Admitting: Physician Assistant

## 2018-03-21 VITALS — BP 111/69 | HR 78 | Temp 98.2°F | Resp 18 | Ht 64.0 in | Wt 167.0 lb

## 2018-03-21 DIAGNOSIS — M62838 Other muscle spasm: Secondary | ICD-10-CM | POA: Insufficient documentation

## 2018-03-21 DIAGNOSIS — R519 Headache, unspecified: Secondary | ICD-10-CM

## 2018-03-21 DIAGNOSIS — E1165 Type 2 diabetes mellitus with hyperglycemia: Secondary | ICD-10-CM | POA: Diagnosis not present

## 2018-03-21 DIAGNOSIS — M542 Cervicalgia: Secondary | ICD-10-CM | POA: Diagnosis present

## 2018-03-21 DIAGNOSIS — T7840XA Allergy, unspecified, initial encounter: Secondary | ICD-10-CM

## 2018-03-21 DIAGNOSIS — E119 Type 2 diabetes mellitus without complications: Secondary | ICD-10-CM | POA: Diagnosis not present

## 2018-03-21 DIAGNOSIS — Z79899 Other long term (current) drug therapy: Secondary | ICD-10-CM | POA: Insufficient documentation

## 2018-03-21 DIAGNOSIS — Z789 Other specified health status: Secondary | ICD-10-CM | POA: Diagnosis not present

## 2018-03-21 DIAGNOSIS — R51 Headache: Secondary | ICD-10-CM | POA: Insufficient documentation

## 2018-03-21 LAB — GLUCOSE, POCT (MANUAL RESULT ENTRY): POC Glucose: 327 mg/dl — AB (ref 70–99)

## 2018-03-21 LAB — POCT GLYCOSYLATED HEMOGLOBIN (HGB A1C): HEMOGLOBIN A1C: 10.6

## 2018-03-21 MED ORDER — PEN NEEDLES 31G X 5 MM MISC: 1.0000 | Freq: Every day | 0 refills | Status: DC

## 2018-03-21 MED ORDER — CETIRIZINE HCL 10 MG PO TABS
10.0000 mg | ORAL_TABLET | Freq: Every day | ORAL | 11 refills | Status: DC
Start: 2018-03-21 — End: 2019-05-28

## 2018-03-21 MED ORDER — INSULIN DETEMIR 100 UNIT/ML FLEXPEN: 10.0000 [IU] | PEN_INJECTOR | Freq: Every day | SUBCUTANEOUS | 11 refills | Status: DC

## 2018-03-21 MED ORDER — INSULIN ASPART 100 UNIT/ML ~~LOC~~ SOLN
20.0000 [IU] | Freq: Once | SUBCUTANEOUS | Status: AC
  Administered 2018-03-21: 20 [IU] via SUBCUTANEOUS

## 2018-03-21 MED ORDER — CYCLOBENZAPRINE HCL 5 MG PO TABS: ORAL_TABLET | ORAL | 1 refills | Status: DC

## 2018-03-21 MED ORDER — ATORVASTATIN CALCIUM 20 MG PO TABS: 20.0000 mg | ORAL_TABLET | Freq: Every day | ORAL | 11 refills | Status: DC

## 2018-03-21 MED ORDER — IBUPROFEN 600 MG PO TABS: 600.0000 mg | ORAL_TABLET | Freq: Three times a day (TID) | ORAL | 0 refills | Status: DC | PRN

## 2018-03-21 MED ORDER — SITAGLIPTIN PHOS-METFORMIN HCL 50-1000 MG PO TABS: 1.0000 | ORAL_TABLET | Freq: Two times a day (BID) | ORAL | 3 refills | Status: DC

## 2018-03-21 MED FILL — TRUEPLUS PEN NDL 31GX3/16: 31G X 5 MM | 30 days supply | Qty: 100 | Fill #0

## 2018-03-21 MED FILL — ATORVASTATIN 20 MG TABLET: 20 | 30 days supply | Qty: 30 | Fill #0

## 2018-03-21 MED FILL — JANUMET 50-1,000 MG TABLET: 50-1000 | 30 days supply | Qty: 60 | Fill #0

## 2018-03-21 MED FILL — TRUEPLUS PEN NDL 31GX3/16": 31G X 5 MM | 30 days supply | Qty: 100 | Fill #0

## 2018-03-21 MED FILL — LEVEMIR FLEXTOUCH 100 UNITS: 100 | 30 days supply | Qty: 3 | Fill #0

## 2018-03-21 MED FILL — IBUPROFEN 600 MG TABLET: 600 | 10 days supply | Qty: 30 | Fill #0

## 2018-03-21 MED FILL — CYCLOBENZAPRINE 5 MG TABLET: 5 | 10 days supply | Qty: 30 | Fill #0

## 2018-03-21 NOTE — Progress Notes (Signed)
Patient ID: Natalie Cherry, female   DOB: 1961/06/10, 57 y.o.   MRN: [id number]      Natalie Cherry, is a 57 y.o. female  [id number]  [id number]  DOB - August 19, 1961  Subjective:  Chief Complaint and HPI: Natalie Cherry is a 57 y.o. female here today  Pain is in the back of the neck and headache X4 days.  She has been sleeping on a new pillow and has been doing some lifting boxes.  No radiating pain.  No vision changes.  Husband having similar symptoms except c/o joint pains today and not HA.  (s/sx not improving when they leave their residence-I discussed concerns/s/sx of CO poisoning.  This does not sound consistent with that, but I have encouraged them to install sensors, nonetheless).  No vision changes.  No lethargy.  No N/V/D.  She has also been having sneezing and congestion with allergies.  No paresthesias.  HA decreases with Ibuprofen  Not checking blood sugars at home.  Denies s/sx diabetes.  Says she is compliant on DM meds.  But out of Januvia X 2 weeks   Daughter translating   ROS:   Constitutional:  No f/c, No night sweats, No unexplained weight loss. EENT:  No vision changes, No blurry vision, No hearing changes. No mouth, throat, or ear problems.  Respiratory: No cough, No SOB Cardiac: No CP, no palpitations GI:  No abd pain, No N/V/D. GU: No Urinary s/sx Musculoskeletal: No joint pain Neuro: + headache, no dizziness, no motor weakness.  Skin: No rash Endocrine:  No polydipsia. No polyuria.  Psych: Denies SI/HI  No problems updated.  ALLERGIES: No Known Allergies  PAST MEDICAL HISTORY: Past Medical History:  Diagnosis Date  . Arthritis   . Diabetes mellitus   . Hypertension     MEDICATIONS AT HOME: Prior to Admission medications   Medication Sig Start Date End Date Taking? Authorizing Provider  ACCU-CHEK SOFTCLIX LANCETS lancets 1 each by Other route 3 (three) times daily. 12/13/16  Yes Funches, Josalyn, MD  atorvastatin (LIPITOR) 20 MG tablet Take 1  tablet (20 mg total) by mouth daily. 12/19/16  Yes Funches, Josalyn, MD  Blood Glucose Monitoring Suppl (ACCU-CHEK AVIVA PLUS) w/Device KIT 1 Device by Does not apply route 3 (three) times daily after meals. 12/13/16  Yes Funches, Josalyn, MD  fluticasone (FLONASE) 50 MCG/ACT nasal spray Place 2 sprays into both nostrils daily. 05/10/17  Yes Brunetta Newingham M, PA-C  folic acid (FOLVITE) 1 MG tablet TAKE 1 TABLET BY MOUTH DAILY. 04/07/17  Yes Jegede, Olugbemiga E, MD  glucose blood (ACCU-CHEK AVIVA PLUS) test strip 1 each by Other route 3 (three) times daily. 12/13/16  Yes Funches, Josalyn, MD  naproxen (NAPROSYN) 500 MG tablet TAKE 1 TABLET BY MOUTH 2 TIMES DAILY WITH A MEAL prn pain 05/10/17  Yes Yachet Mattson M, PA-C  sitaGLIPtin-metformin (JANUMET) 50-1000 MG tablet Take 1 tablet by mouth 2 (two) times daily with a meal. 05/10/17  Yes Niko Penson M, PA-C  traZODone (DESYREL) 50 MG tablet Take 50 mg by mouth at bedtime.   Yes [provider]  cetirizine (ZYRTEC) 10 MG tablet Take 1 tablet (10 mg total) by mouth daily. 03/21/18   Argentina Donovan, PA-C  cyclobenzaprine (FLEXERIL) 5 MG tablet 1/2- 1 tid prn spasm 03/21/18   Freeman Caldron M, PA-C  ibuprofen (ADVIL,MOTRIN) 600 MG tablet Take 1 tablet (600 mg total) by mouth every 8 (eight) hours as needed. 03/21/18   Argentina Donovan, PA-C  Insulin  Detemir (LEVEMIR) 100 UNIT/ML Pen Inject 10 Units into the skin daily at 10 pm. 03/21/18   Argentina Donovan, PA-C  Insulin Pen Needle (PEN NEEDLES) 31G X 5 MM MISC 1 Dose by Does not apply route daily. 03/21/18   Argentina Donovan, PA-C     Objective:  EXAM:   Vitals:   03/21/18 0935  BP: 111/69  Pulse: 78  Resp: 18  Temp: 98.2 F (36.8 C)  TempSrc: Oral  SpO2: 96%  Weight: 167 lb (75.8 kg)  Height: 5' 4" (1.626 m)    General appearance : A&OX3. NAD. Non-toxic-appearing HEENT: Atraumatic and Normocephalic.  PERRLA. EOM intact.  TM clear B.  Fundi benign Mouth-MMM, post pharynx WNL  w/o erythema, No PND. Neck: supple, no JVD. No cervical lymphadenopathy. No thyromegaly.  +trapezius B with TTP spasm.   Chest/Lungs:  Breathing-non-labored, Good air entry bilaterally, breath sounds normal without rales, rhonchi, or wheezing  CVS: S1 S2 regular, no murmurs, gallops, rubs  Extremities: Bilateral Lower Ext shows no edema, both legs are warm to touch with = pulse throughout Neurology:  CN II-XII grossly intact, Non focal.   Psych:  TP linear. J/I WNL. Normal speech. Appropriate eye contact and affect.  Skin:  No Rash  Data Review Lab Results  Component Value Date   HGBA1C 10.6 03/21/2018   HGBA1C 6.6 05/10/2017   HGBA1C 9.8 01/16/2017     Assessment & Plan   1. Acute nonintractable headache, unspecified headache type - ibuprofen (ADVIL,MOTRIN) 600 MG tablet; Take 1 tablet (600 mg total) by mouth every 8 (eight) hours as needed.  Dispense: 30 tablet; Refill: 0  2. Muscle spasm - cyclobenzaprine (FLEXERIL) 5 MG tablet; 1/2- 1 tid prn spasm  Dispense: 30 tablet; Refill: 1  3. Type 2 diabetes mellitus with hyperglycemia, without long-term current use of insulin (HCC) A1C=10.6 today Uncontrolled on current regimen- Glucose (CBG) - HgB A1c - insulin aspart (novoLOG) injection 20 Units Add/start- Insulin Detemir (LEVEMIR) 100 UNIT/ML Pen; Inject 10 Units into the skin daily at 10 pm.  Dispense: 15 mL; Refill: 11 - Insulin Pen Needle (PEN NEEDLES) 31G X 5 MM MISC; 1 Dose by Does not apply route daily.  Dispense: 100 each; Refill: 0 Check blood sugars fasting and at bedtime and record and bring to next visit.  Increase water intake.  Eliminate sugar and white carbohydrates from diet.  4. Language barrier Extra time taken due to language barrier  5. Allergic state, initial encounter zyrtec     Patient have been counseled extensively about nutrition and exercise  Make appt with new PCP in 3 weeks to assess DM and fill out forms  The patient was given clear  instructions to go to ER or return to medical center if symptoms don't improve, worsen or new problems develop. The patient verbalized understanding. The patient was told to call to get lab results if they haven't heard anything in the next week.     Freeman Caldron, PA-C [facility] and [facility] [place], [place]   03/21/2018, 9:52 AM

## 2018-03-21 NOTE — Patient Instructions (Addendum)
Check blood sugars fasting and at bedtime and record and bring to next visit.  Increase water intake.  Eliminate sugar and white carbohydrates from diet.     Diabetes mellitus y nutricin Diabetes Mellitus and Nutrition Si sufre de diabetes (diabetes mellitus), es muy importante tener hbitos alimenticios saludables debido a que sus niveles de Psychologist, counselling sangre (glucosa) se ven afectados en gran medida por lo que come y bebe. [place] alimentos saludables en las cantidades [place], aproximadamente a la [employer], [place] ayudar a:  Scientist, physiological glucemia.  Disminuir el riesgo de sufrir una enfermedad cardaca.  Mejorar la presin arterial.  Barista o mantener un peso saludable.  Todas las personas que sufren de diabetes son diferentes y cada una tiene necesidades diferentes en cuanto a un plan de alimentacin. El mdico puede recomendarle que trabaje con un especialista en dietas y nutricin (nutricionista) para Tax adviser plan para usted. Su plan de alimentacin puede variar segn factores como:  Las caloras que necesita.  Los medicamentos que toma.  Su peso.  Sus niveles de glucemia, presin arterial y colesterol.  Su nivel de [place].  Otras afecciones que tenga, como enfermedades cardacas o renales.  Cmo me afectan los carbohidratos? Los carbohidratos afectan el nivel de glucemia ms que cualquier otro tipo de alimento. La ingesta de carbohidratos naturalmente aumenta la cantidad glucosa en la sangre. El recuento de carbohidratos es un mtodo destinado a Midwife un registro de la cantidad de carbohidratos que se ingieren. El recuento de carbohidratos es importante para Pharmacologist la glucemia a un nivel saludable, en especial si utiliza insulina o toma determinados medicamentos por va oral para la diabetes. Es importante saber la cantidad de carbohidratos que se pueden ingerir en cada comida sin correr Surveyor, minerals. Esto es Government social research officer. El  nutricionista puede ayudarlo a calcular la cantidad de carbohidratos que debe ingerir en cada comida y colacin. Los alimentos que contienen carbohidratos incluyen:  Pan, cereal, arroz, pasta y galletas.  Papas y maz.  Guisantes, frijoles y lentejas.  Leche y Dentist.  Nils Pyle y [place].  Postres, como pasteles, galletitas, helado y caramelos.  Cmo me afecta el alcohol? El alcohol puede provocar disminuciones sbitas de la glucemia (hipoglucemia), en especial si utiliza insulina o toma determinados medicamentos por va oral para la diabetes. La hipoglucemia es una afeccin potencialmente mortal. Los sntomas de la hipoglucemia (somnolencia, mareos y confusin) son similares a los sntomas de haber consumido demasiado alcohol. Si el mdico afirma que el alcohol es seguro para usted, siga estas pautas:  Limite el consumo de alcohol a no ms de 1 medida por da si es mujer y no est Orthoptist, y a 2 medidas si es hombre. Una medida equivale a 12oz ( ) de cerveza, 5oz ( ) de vino o 1oz (78ml) de bebidas de alta graduacin alcohlica.  No beba con el estmago vaco.  Mantngase hidratado con agua, gaseosas dietticas o t helado sin azcar.  Tenga en cuenta que las gaseosas comunes, los jugos y otros refrescos pueden contener mucha azcar y se deben contar como carbohidratos.  Consejos para seguir Consulting civil engineer las etiquetas de los alimentos  Comience por controlar el tamao de la porcin en la etiqueta. La cantidad de caloras, carbohidratos, grasas y otros nutrientes mencionados en la etiqueta se basan en una porcin del alimento. Muchos alimentos contienen ms de una porcin por envase.  Verifique la cantidad total de gramos (g) de carbohidratos totales en una porcin. Puede calcular  la cantidad de porciones de carbohidratos al dividir el total de carbohidratos por 15. Por ejemplo, si un alimento posee un total de 30g de carbohidratos, equivale a 2 porciones de  carbohidratos.  Verifique la cantidad de gramos (g) de grasas saturadas y grasas trans en una porcin. Escoja alimentos que no contengan grasa o que tengan un bajo contenido.  Controle la cantidad de miligramos (mg) de sodio en una porcin. La mayora de las personas deben limitar la ingesta de sodio total a menos de 2300mg  por Futures trader.  Siempre consulte la informacin nutricional de los alimentos etiquetados como "con bajo contenido de grasa" o "sin grasa". Estos alimentos pueden ser ms altos en azcar agregada o en carbohidratos refinados y deben evitarse.  Hable con el nutricionista para identificar sus objetivos diarios en cuanto a los nutrientes mencionados en la etiqueta. De compras  Evite comprar alimentos procesados, enlatados o prehechos. Estos alimentos tienden a Civil Service fast streamer cantidad de [place], sodio y azcar agregada.  Compre en la zona exterior de la tienda de comestibles. Esta incluye frutas y [employer], granos a granel, carnes frescas y productos lcteos frescos. Coccin  Utilice mtodos de coccin a baja temperatura, como hornear, en lugar de mtodos de coccin a alta temperatura, como frer en abundante aceite.  Cocine con aceites saludables, como el aceite de [place], canola o [place].  Evite cocinar con manteca, crema o carnes con alto contenido de grasa. Planificacin de las comidas  [employer] comidas y las colaciones de forma regular, preferentemente a la misma hora todos [place]. Evite pasar largos perodos de tiempo sin comer.  Consuma alimentos ricos en fibra, como frutas frescas, verduras, frijoles y cereales integrales. Consulte al nutricionista sobre cuntas porciones de carbohidratos puede consumir en cada comida.  Consuma entre 4 y 6 onzas de protenas magras por da, como carnes [place], pollo, pescado, [employer] o tofu. 1 onza equivale a 1 onza de carne, pollo o pescado, 1 huevo, o 1/4 taza de tofu.  Coma algunos alimentos por da que contengan grasas  saludables, como aguacates, frutos secos, semillas y pescado. Estilo de vida   Controle su nivel de glucemia con regularidad.  Haga ejercicio al menos , 5das o ms por semana, o como se lo haya indicado el mdico.  Tome los [employer] se lo haya indicado el mdico.  No consuma ningn producto que contenga nicotina o tabaco, como cigarrillos y Administrator, Civil Service. Si necesita ayuda para dejar de fumar, consulte al CIGNA con un asesor o instructor en diabetes para identificar estrategias para controlar el estrs y cualquier desafo emocional y social. Cules son algunas de las preguntas que puedo hacerle a mi mdico?  Es necesario que me rena con IT trainer en diabetes?  Es necesario que me rena con un nutricionista?  A qu nmero puedo llamar si tengo preguntas?  Cules son los mejores momentos para controlar la glucemia? Dnde encontrar ms informacin:  Asociacin Americana de la Diabetes (American Diabetes Association): diabetes.org/food-and-fitness/food  Academia de Nutricin y Pension scheme manager (Academy of Nutrition and Dietetics): https://www.vargas.com/  [employer] de la Diabetes y las Enfermedades Digestivas y Special educational needs teacher [facility] of Diabetes and Digestive and Kidney Diseases) (Institutos Nacionales de [place], NIH): FindJewelers.cz Resumen  Un plan de alimentacin saludable lo ayudar a Scientist, physiological glucemia y Pharmacologist un estilo de vida saludable.  Trabajar con un especialista en dietas y nutricin (nutricionista) puede ayudarlo a Designer, television/film set de alimentacin para usted.  Tenga en cuenta que los carbohidratos y  el alcohol tienen efectos inmediatos en sus niveles de glucemia. Es importante contar los carbohidratos y consumir alcohol con prudencia. Esta informacin no tiene Theme park manager el consejo  del mdico. Asegrese de hacerle al mdico cualquier pregunta que tenga. Document Released: 02/21/2008 Document Revised: 03/06/2017 Document Reviewed: 03/06/2017 Elsevier Interactive Patient Education  2018 [employer].

## 2018-04-16 ENCOUNTER — Ambulatory Visit: Payer: Medicare Other | Attending: Internal Medicine | Admitting: Internal Medicine

## 2018-04-16 ENCOUNTER — Encounter: Payer: Self-pay | Admitting: Internal Medicine

## 2018-04-16 VITALS — BP 113/75 | HR 69 | Temp 98.0°F | Resp 16 | Ht 64.0 in | Wt 168.6 lb

## 2018-04-16 DIAGNOSIS — Z1211 Encounter for screening for malignant neoplasm of colon: Secondary | ICD-10-CM

## 2018-04-16 DIAGNOSIS — E559 Vitamin D deficiency, unspecified: Secondary | ICD-10-CM | POA: Insufficient documentation

## 2018-04-16 DIAGNOSIS — M069 Rheumatoid arthritis, unspecified: Secondary | ICD-10-CM

## 2018-04-16 DIAGNOSIS — E785 Hyperlipidemia, unspecified: Secondary | ICD-10-CM | POA: Diagnosis not present

## 2018-04-16 DIAGNOSIS — Z794 Long term (current) use of insulin: Secondary | ICD-10-CM | POA: Insufficient documentation

## 2018-04-16 DIAGNOSIS — Z23 Encounter for immunization: Secondary | ICD-10-CM

## 2018-04-16 DIAGNOSIS — Z1239 Encounter for other screening for malignant neoplasm of breast: Secondary | ICD-10-CM

## 2018-04-16 DIAGNOSIS — E1165 Type 2 diabetes mellitus with hyperglycemia: Secondary | ICD-10-CM

## 2018-04-16 DIAGNOSIS — Z79899 Other long term (current) drug therapy: Secondary | ICD-10-CM | POA: Insufficient documentation

## 2018-04-16 DIAGNOSIS — Z7951 Long term (current) use of inhaled steroids: Secondary | ICD-10-CM | POA: Diagnosis not present

## 2018-04-16 DIAGNOSIS — Z1231 Encounter for screening mammogram for malignant neoplasm of breast: Secondary | ICD-10-CM

## 2018-04-16 DIAGNOSIS — E119 Type 2 diabetes mellitus without complications: Secondary | ICD-10-CM | POA: Diagnosis present

## 2018-04-16 DIAGNOSIS — Z833 Family history of diabetes mellitus: Secondary | ICD-10-CM | POA: Diagnosis not present

## 2018-04-16 LAB — GLUCOSE, POCT (MANUAL RESULT ENTRY): POC GLUCOSE: 159 mg/dL — AB (ref 70–99)

## 2018-04-16 MED ORDER — TRUE METRIX METER W/DEVICE KIT: PACK | 0 refills | Status: DC

## 2018-04-16 MED ORDER — GLUCOSE BLOOD VI STRP: ORAL_STRIP | 12 refills | Status: DC

## 2018-04-16 MED ORDER — TRUEPLUS LANCETS 28G MISC: 2 refills | Status: DC

## 2018-04-16 NOTE — Patient Instructions (Addendum)
Please discuss your joint symptoms with your rheumatologist whom you are scheduled to see this week.  Continue Levemir 10 units at bedtime Please fill the prescription for the Atorvastain.  This is the medication to help lower your cholesterol.    Fall Prevention in the Home Falls can cause injuries. They can happen to people of all ages. There are many things you can do to make your home safe and to help prevent falls. What can I do on the outside of my home?  Regularly fix the edges of walkways and driveways and fix any cracks.  Remove anything that might make you trip as you walk through a door, such as a raised step or threshold.  Trim any bushes or trees on the path to your home.  Use bright outdoor lighting.  Clear any walking paths of anything that might make someone trip, such as rocks or tools.  Regularly check to see if handrails are loose or broken. Make sure that both sides of any steps have handrails.  Any raised decks and porches should have guardrails on the edges.  Have any leaves, snow, or ice cleared regularly.  Use sand or salt on walking paths during winter.  Clean up any spills in your garage right away. This includes oil or grease spills. What can I do in the bathroom?  Use night lights.  Install grab bars by the toilet and in the tub and shower. Do not use towel bars as grab bars.  Use non-skid mats or decals in the tub or shower.  If you need to sit down in the shower, use a plastic, non-slip stool.  Keep the floor dry. Clean up any water that spills on the floor as soon as it happens.  Remove soap buildup in the tub or shower regularly.  Attach bath mats securely with double-sided non-slip rug tape.  Do not have throw rugs and other things on the floor that can make you trip. What can I do in the bedroom?  Use night lights.  Make sure that you have a light by your bed that is easy to reach.  Do not use any sheets or blankets that are too  big for your bed. They should not hang down onto the floor.  Have a firm chair that has side arms. You can use this for support while you get dressed.  Do not have throw rugs and other things on the floor that can make you trip. What can I do in the kitchen?  Clean up any spills right away.  Avoid walking on wet floors.  Keep items that you use a lot in easy-to-reach places.  If you need to reach something above you, use a strong step stool that has a grab bar.  Keep electrical cords out of the way.  Do not use floor polish or wax that makes floors slippery. If you must use wax, use non-skid floor wax.  Do not have throw rugs and other things on the floor that can make you trip. What can I do with my stairs?  Do not leave any items on the stairs.  Make sure that there are handrails on both sides of the stairs and use them. Fix handrails that are broken or loose. Make sure that handrails are as long as the stairways.  Check any carpeting to make sure that it is firmly attached to the stairs. Fix any carpet that is loose or worn.  Avoid having throw rugs at the top  or bottom of the stairs. If you do have throw rugs, attach them to the floor with carpet tape.  Make sure that you have a light switch at the top of the stairs and the bottom of the stairs. If you do not have them, ask someone to add them for you. What else can I do to help prevent falls?  Wear shoes that: ? Do not have high heels. ? Have rubber bottoms. ? Are comfortable and fit you well. ? Are closed at the toe. Do not wear sandals.  If you use a stepladder: ? Make sure that it is fully opened. Do not climb a closed stepladder. ? Make sure that both sides of the stepladder are locked into place. ? Ask someone to hold it for you, if possible.  Clearly mark and make sure that you can see: ? Any grab bars or handrails. ? First and last steps. ? Where the edge of each step is.  Use tools that help you move  around (mobility aids) if they are needed. These include: ? Canes. ? Walkers. ? Scooters. ? Crutches.  Turn on the lights when you go into a dark area. Replace any light bulbs as soon as they burn out.  Set up your furniture so you have a clear path. Avoid moving your furniture around.  If any of your floors are uneven, fix them.  If there are any pets around you, be aware of where they are.  Review your medicines with your doctor. Some medicines can make you feel dizzy. This can increase your chance of falling. Ask your doctor what other things that you can do to help prevent falls. This information is not intended to replace advice given to you by your health care provider. Make sure you discuss any questions you have with your health care provider. Document Released: 09/10/2009 Document Revised: 04/21/2016 Document Reviewed: 12/19/2014 Elsevier Interactive Patient Education  2018 [employer].   Madilyn Fireman Td (contra la difteria y el ttanos): Lo que debe saber (Td Vaccine [place] and Diphtheria]: What You Need to Know) 1. Por qu vacunarse? El ttanos y la difteria son enfermedades muy graves. Son Academic librarian frecuentes en los [place] actualmente, pero las personas que se infectan suelen tener complicaciones graves. La vacuna Td se [place] para proteger a los adolescentes y a los adultos de ambas enfermedades. Tanto el ttanos como la difteria son infecciones causadas por bacterias. La difteria se transmite de persona a persona a travs de la tos o el estornudo. La bacteria que causa el ttanos entra al cuerpo a travs de cortes, raspones o heridas. El TTANOS (trismo) provoca entumecimiento y Engineer, materials dolorosa de los msculos, por lo general, en todo el cuerpo.  Puede causar el endurecimiento de los msculos de la cabeza y el cuello, de modo que impide abrir la boca, tragar y en algunos casos, Industrial/product designer. El ttanos es causa de muerte en aproximadamente 1de cada 10personas que contraen  la infeccin, incluso despus de que reciben la mejor atencin mdica. La DIFTERIA puede hacer que se forme una membrana gruesa en la parte posterior de la garganta.  Puede causar problemas respiratorios, parlisis, insuficiencia cardaca e incluso la muerte. Antes de las vacunas, en los [place] se informaban 200000 casos de difteria y cientos de casos de ttanos cada ao. Desde que comenz la vacunacin, los informes de casos de ambas enfermedades se han reducido en un 99%. 2. Madilyn Fireman Td La vacuna Td protege a adolescentes y Microbiologist  ttanos y la difteria. La vacuna Td habitualmente se aplica como dosis de refuerzo cada 10aos, pero tambin puede administrarse antes si la persona sufre una [place] o herida sucia y grave. A veces, en lugar de la vacuna Td, se recomienda una vacuna llamada Tdap, que protege contra la tosferina, adems de proteger contra el ttanos y la difteria. El mdico o la persona que le aplique la vacuna puede darle ms informacin al [employer]. La Td puede administrarse de manera segura simultneamente con otras vacunas. 3. Algunas personas no deben recibir la vacuna  Una persona que alguna vez ha tenido una reaccin alrgica potencialmente mortal a una dosis anterior de cualquier vacuna contra el ttanos o la difteria, O que tenga una alergia grave a cualquier parte de esta vacuna, no debe recibir la vacuna Td. Informe a la persona que le aplica la vacuna si usted tiene cualquier alergia grave.  Consulte con su mdico si: ? tuvo hinchazn o dolor intenso despus de recibir cualquier vacuna contra la difteria o el ttanos, ? alguna vez ha sufrido el sndrome de Pension scheme manager, ? no se siente Research scientist (life sciences) en que se ha programado la vacuna.  4. Riesgos de [place] reaccin a la vacuna Con cualquier medicamento, incluyendo las vacunas, existe la posibilidad de que aparezcan efectos secundarios. Suelen ser leves y desaparecen por s solos. Tambin son posibles las  reacciones graves, pero en raras ocasiones. La [employer] de las personas a las que se les aplica la vacuna Td no tienen ningn problema. Problemas leves despus de la vacuna Td: (No interfirieron en otras actividades)  Dolor en el lugar donde se aplic la vacuna (alrededor de 8de cada 10personas)  Enrojecimiento o hinchazn en el lugar donde se aplic la vacuna (alrededor de 1de cada 4personas)  Fiebre leve (poco frecuente)  Dolor de Training and development officer (alrededor de 1de cada 4personas)  Cansancio (alrededor de 1de cada 4personas) Problemas moderados despus de la vacuna Td: (Interfirieron en otras actividades, pero no requirieron atencin mdica)  Fiebre superior a 102F (38,8C) (poco frecuente) Problemas graves despus de la vacuna Td: (Impidieron Education officer, environmental las actividades habituales; requirieron atencin mdica)  Buyer, retail, dolor intenso, sangrado o enrojecimiento en el brazo en que se aplic la vacuna (poco frecuente). Problemas que podran ocurrir despus de cualquier vacuna:  Las personas a veces se desmayan despus de un procedimiento mdico, incluida la vacunacin. Si permanece sentado o recostado durante 15 minutos puede ayudar a [employer] y las lesiones causadas por las cadas. Informe al mdico si se siente mareado, tiene cambios en la visin o zumbidos en los odos.  Algunas personas sienten un dolor intenso en el hombro y tienen dificultad para mover el brazo donde se coloc la vacuna. Esto sucede con muy poca frecuencia.  Cualquier medicamento puede causar una reaccin alrgica grave. Dichas reacciones son Lynnae Sandhoff poco frecuentes con una vacuna (se calcula que menos de 1en un milln de dosis) y se producen de unos minutos a unas horas despus de Arts development officer. Al igual que con cualquier [employer] Data, existe una probabilidad muy remota de que una vacuna cause una lesin grave o la [place]. Se controla permanentemente la seguridad de las vacunas. Para obtener ms  informacin, visite: http://floyd.org/. 5. Qu pasa si hay una reaccin grave? A qu signos debo estar atento?  Observe todo lo que le preocupe, como signos de una reaccin alrgica grave, fiebre muy alta o comportamiento fuera de lo normal. Los signos de una reaccin alrgica grave pueden incluir ronchas, hinchazn de la cara  y la garganta, dificultad para respirar, latidos cardacos acelerados, mareos y debilidad. Generalmente, estos comenzaran entre unos pocos minutos y algunas horas despus de la vacunacin. Qu debo hacer?  Si usted piensa que se trata de una reaccin alrgica grave o de otra emergencia que no puede esperar, llame al 911 o dirjase al hospital ms cercano. Sino, llame a su mdico.  Despus, la reaccin debe informarse al [address] de Informacin sobre Efectos Adversos de las [place] (Vaccine Adverse Event Reporting System, VAERS). Su mdico puede presentar este informe, o puede hacerlo usted mismo a travs del sitio web de VAERS, en www.vaers.LAgents.no, o llamando al ([phone]. VAERS no brinda recomendaciones mdicas. 6. [employer] de Compensacin de Daos por [employer] [place] de Compensacin de Daos por Administrator, arts (National Vaccine Injury Compensation Program, VICP) es un programa federal que fue creado para Patent examiner a las personas que puedan haber sufrido daos al recibir ciertas vacunas. Aquellas personas que consideren que han sufrido un dao como consecuencia de una vacuna y [place] saber ms acerca del programa y de cmo presentar Roslynn Amble, pueden llamar al [phone] o visitar su sitio web en SpiritualWord.at. Hay un lmite de tiempo para presentar un reclamo de compensacin. 7. Cmo puedo obtener ms informacin?  Consulte a su mdico. Este puede darle el prospecto de la vacuna o recomendarle otras fuentes de informacin.  Comunquese con el servicio de salud de su localidad o [address].  Comunquese con los  Centros para Air traffic controller y la Prevencin de Child psychotherapist for Disease Control and Prevention , CDC). ? Llame al [phone] (1-800-CDC-INFO). ? Visite el sitio Environmental manager en [email or web address].uy. Declaracin de informacin sobre la vacuna contra la difteria y el ttanos (Td) de los CDC (03/08/16) Esta informacin no tiene Theme park manager el consejo del mdico. Asegrese de hacerle al mdico cualquier pregunta que tenga. Document Released: 03/02/2009 Document Revised: 12/05/2014 Document Reviewed: 03/08/2016 Elsevier Interactive Patient Education  2017 [employer].

## 2018-04-16 NOTE — Progress Notes (Signed)
Pt states her brain hurts

## 2018-04-16 NOTE — Progress Notes (Signed)
Patient ID: Natalie Cherry, female    DOB: 09/01/1961  MRN: [id number]  CC: re-establish and Diabetes   Subjective: Natalie Cherry is a 57 y.o. female who presents for chronic ds management.  Daughter, Verdis Frederickson, is with her and request to interpret instead of our interpreting service Her concerns today include:  Pt with hx of DM, HL, RA  C/o pain in c-spine, shoulders, hands and feet in mornings.  + morning stiffness lasting 3-4 hrs pt with hx of RA.  Seen 6 mths ago.  Has appt this wk.  Currently on Prednisone 20 mg, Tramadol one daily, Naprosyn BID, MTX? 5 mg daily.  DM:  Does not check BS.  No device Eating Habits:  Eating better, more veggies; limits consumption red meat.  Drinking more water Med:  Started on Levemir 10 IU on last visit. Reports compliance with Levemir but only took it for 1 wk stating that she was told to do that. Compliant with Janumet Exercise:  Not doing a lot due to pain.   HL:  RF given on Lipitor on last visit but pt stattes she does not have this one.  Patient Active Problem List   Diagnosis Date Noted  . Type 2 diabetes mellitus ([place]) 11/06/2015  . Rheumatoid arthritis ([place]) 01/29/2014  . Vitamin D deficiency 01/29/2014  . DENTAL CARIES 03/02/2009  . Osteoarthritis of spine 10/05/2007     Current Outpatient Medications on File Prior to Visit  Medication Sig Dispense Refill  . ACCU-CHEK SOFTCLIX LANCETS lancets 1 each by Other route 3 (three) times daily. 100 each 12  . atorvastatin (LIPITOR) 20 MG tablet Take 1 tablet (20 mg total) by mouth daily. 30 tablet 11  . Blood Glucose Monitoring Suppl (ACCU-CHEK AVIVA PLUS) w/Device KIT 1 Device by Does not apply route 3 (three) times daily after meals. 1 kit 0  . cetirizine (ZYRTEC) 10 MG tablet Take 1 tablet (10 mg total) by mouth daily. 30 tablet 11  . cyclobenzaprine (FLEXERIL) 5 MG tablet 1/2- 1 tid prn spasm 30 tablet 1  . fluticasone (FLONASE) 50 MCG/ACT nasal spray Place 2 sprays into both nostrils daily.  16 g 1  . folic acid (FOLVITE) 1 MG tablet TAKE 1 TABLET BY MOUTH DAILY. 30 tablet 2  . glucose blood (ACCU-CHEK AVIVA PLUS) test strip 1 each by Other route 3 (three) times daily. 100 each 12  . ibuprofen (ADVIL,MOTRIN) 600 MG tablet Take 1 tablet (600 mg total) by mouth every 8 (eight) hours as needed. 30 tablet 0  . Insulin Detemir (LEVEMIR) 100 UNIT/ML Pen Inject 10 Units into the skin daily at 10 pm. 15 mL 11  . Insulin Pen Needle (PEN NEEDLES) 31G X 5 MM MISC 1 Dose by Does not apply route daily. 100 each 0  . naproxen (NAPROSYN) 500 MG tablet TAKE 1 TABLET BY MOUTH 2 TIMES DAILY WITH A MEAL prn pain 60 tablet 0  . sitaGLIPtin-metformin (JANUMET) 50-1000 MG tablet Take 1 tablet by mouth 2 (two) times daily with a meal. 180 tablet 3  . traZODone (DESYREL) 50 MG tablet Take 50 mg by mouth at bedtime.     No current facility-administered medications on file prior to visit.     No Known Allergies  Social History   Socioeconomic History  . Marital status: Married    Spouse name: Not on file  . Number of children: Not on file  . Years of education: Not on file  . Highest education level: Not on  file  Occupational History  . Not on file  Social Needs  . Financial resource strain: Not on file  . Food insecurity:    Worry: Not on file    Inability: Not on file  . Transportation needs:    Medical: Not on file    Non-medical: Not on file  Tobacco Use  . Smoking status: Never Smoker  . Smokeless tobacco: Never Used  Substance and Sexual Activity  . Alcohol use: No  . Drug use: No  . Sexual activity: Yes  Lifestyle  . Physical activity:    Days per week: Not on file    Minutes per session: Not on file  . Stress: Not on file  Relationships  . Social connections:    Talks on phone: Not on file    Gets together: Not on file    Attends religious service: Not on file    Active member of club or organization: Not on file    Attends meetings of clubs or organizations: Not on file     Relationship status: Not on file  . Intimate partner violence:    Fear of current or ex partner: Not on file    Emotionally abused: Not on file    Physically abused: Not on file    Forced sexual activity: Not on file  Other Topics Concern  . Not on file  Social History Narrative  . Not on file    Family History  Problem Relation Age of Onset  . Diabetes Mother     No past surgical history on file.  ROS: Review of Systems Negative except as above PHYSICAL EXAM: BP 113/75   Pulse 69   Temp 98 F (36.7 C) (Oral)   Resp 16   Ht '5\' 4"'  (1.626 m)   Wt 168 lb 9.6 oz (76.5 kg)   SpO2 99%   BMI 28.94 kg/m   Wt Readings from Last 3 Encounters:  04/16/18 168 lb 9.6 oz (76.5 kg)  03/21/18 167 lb (75.8 kg)  05/10/17 156 lb (70.8 kg)    Physical Exam  General appearance - alert, well appearing, and in no distress Mental status - alert, oriented to person, place, and time, normal mood, behavior, speech, dress, motor activity, and thought processes Neck - supple, no significant adenopathy Chest - clear to auscultation, no wheezes, rales or rhonchi, symmetric air entry Heart - normal rate, regular rhythm, normal S1, S2, no murmurs, rubs, clicks or gallops Musculoskeletal - mild tenderness on palpation of the cervical spine. Swelling and enlargement of the wrists and MCP joints especially the first MCP joints. These joints are tender to palpation.  Extremities - peripheral pulses normal, no pedal edema, no clubbing or cyanosis   Results for orders placed or performed in visit on 04/16/18  POCT glucose (manual entry)  Result Value Ref Range   POC Glucose 159 (A) 70 - 99 mg/dl   Lab Results  Component Value Date   HGBA1C 10.6 03/21/2018     ASSESSMENT AND PLAN: 1. Type 2 diabetes mellitus with hyperglycemia, without long-term current use of insulin (HCC) Not at goal. Advised patient to take the Levemir every day as prescribed. Continue the Janumet. Healthy eating habits  discussed. Once her RA is under better control she will try to get in some exercise. - POCT glucose (manual entry) - CBC - Comprehensive metabolic panel - Lipid panel - Microalbumin / creatinine urine ratio - Blood Glucose Monitoring Suppl (TRUE METRIX METER) w/Device KIT; Use as  directed  Dispense: 1 kit; Refill: 0 - glucose blood (TRUE METRIX BLOOD GLUCOSE TEST) test strip; Use as instructed  Dispense: 100 each; Refill: 12 - TRUEPLUS LANCETS 28G MISC; Use as directed  Dispense: 100 each; Refill: 2  2. Rheumatoid arthritis involving multiple sites, unspecified rheumatoid factor presence ([place]) She has an appointment coming up with the rheumatologist later this week. Advised to discuss her symptoms which do not appear to be under good control on her current regiment  3. Hyperlipidemia, unspecified hyperlipidemia type I have refill atorvastatin. Patient to pick up from pharmacy.  4. Need for Tdap vaccination  5. Breast cancer screening - MM Digital Screening; Future  6. Colon cancer screening - Fecal occult blood, imunochemical([facility])   Patient was given the opportunity to ask questions.  Patient verbalized understanding of the plan and was able to repeat key elements of the plan.   Orders Placed This Encounter  Procedures  . Tdap vaccine greater than or equal to 7yo IM  . Microalbumin / creatinine urine ratio  . POCT glucose (manual entry)     Requested Prescriptions    No prescriptions requested or ordered in this encounter    No follow-ups on file.  Karle Plumber, MD, FACP

## 2018-04-17 LAB — CBC
Hematocrit: 37.1 % (ref 34.0–46.6)
Hemoglobin: 12.3 g/dL (ref 11.1–15.9)
MCH: 28.7 pg (ref 26.6–33.0)
MCHC: 33.2 g/dL (ref 31.5–35.7)
MCV: 87 fL (ref 79–97)
PLATELETS: 347 10*3/uL (ref 150–450)
RBC: 4.29 x10E6/uL (ref 3.77–5.28)
RDW: 13.2 % (ref 12.3–15.4)
WBC: 6.4 10*3/uL (ref 3.4–10.8)

## 2018-04-17 LAB — COMPREHENSIVE METABOLIC PANEL
A/G RATIO: 1.2 (ref 1.2–2.2)
ALT: 17 IU/L (ref 0–32)
AST: 15 IU/L (ref 0–40)
Albumin: 4 g/dL (ref 3.5–5.5)
Alkaline Phosphatase: 104 IU/L (ref 39–117)
BUN/Creatinine Ratio: 20 (ref 9–23)
BUN: 14 mg/dL (ref 6–24)
CHLORIDE: 104 mmol/L (ref 96–106)
CO2: 21 mmol/L (ref 20–29)
Calcium: 9.3 mg/dL (ref 8.7–10.2)
Creatinine, Ser: 0.69 mg/dL (ref 0.57–1.00)
GFR calc non Af Amer: 97 mL/min/{1.73_m2} (ref >59)
GFR, EST AFRICAN AMERICAN: 112 mL/min/{1.73_m2} (ref >59)
GLUCOSE: 139 mg/dL — AB (ref 65–99)
Globulin, Total: 3.4 g/dL (ref 1.5–4.5)
POTASSIUM: 4.3 mmol/L (ref 3.5–5.2)
Sodium: 140 mmol/L (ref 134–144)
TOTAL PROTEIN: 7.4 g/dL (ref 6.0–8.5)

## 2018-04-17 LAB — LIPID PANEL
CHOL/HDL RATIO: 4.4 ratio (ref 0.0–4.4)
CHOLESTEROL TOTAL: 167 mg/dL (ref 100–199)
HDL: 38 mg/dL — AB (ref >39)
LDL Calculated: 93 mg/dL (ref 0–99)
Triglycerides: 181 mg/dL — ABNORMAL HIGH (ref 0–149)
VLDL Cholesterol Cal: 36 mg/dL (ref 5–40)

## 2018-04-17 LAB — MICROALBUMIN / CREATININE URINE RATIO
Creatinine, Urine: 93.4 mg/dL
Microalbumin, Urine: <3 ug/mL

## 2018-04-20 ENCOUNTER — Telehealth: Payer: Self-pay

## 2018-04-20 NOTE — Telephone Encounter (Signed)
CMA call patient regarding lab results   Patient daughter answer call   Patient daughter Verify DOB & was aware and understood lab results

## 2018-04-20 NOTE — Telephone Encounter (Signed)
-----   Message from Particia Lather, [place] sent at 04/19/2018 10:53 AM EDT -----   ----- Message ----- From: Marcine Matar, MD Sent: 04/17/2018   7:32 AM To: Particia Lather, RMA  Let patient know that her blood count is normal. Kidney and liver function tests normal. Cholesterol level looks good.

## 2018-04-24 MED FILL — ATORVASTATIN 20 MG TABLET: 20 | 90 days supply | Qty: 90 | Fill #1

## 2018-04-24 MED FILL — JANUMET 50-1,000 MG TABLET: 50-1000 | 90 days supply | Qty: 180 | Fill #1

## 2018-04-25 ENCOUNTER — Other Ambulatory Visit: Payer: Self-pay | Admitting: Internal Medicine

## 2018-04-25 DIAGNOSIS — M0600 Rheumatoid arthritis without rheumatoid factor, unspecified site: Secondary | ICD-10-CM

## 2018-04-25 MED ORDER — FOLIC ACID 1 MG PO TABS: 1.0000 mg | ORAL_TABLET | Freq: Every day | ORAL | 2 refills | Status: DC

## 2018-07-17 ENCOUNTER — Encounter: Payer: Self-pay | Admitting: Internal Medicine

## 2018-07-17 ENCOUNTER — Ambulatory Visit: Payer: Medicare Other | Attending: Internal Medicine | Admitting: Internal Medicine

## 2018-07-17 VITALS — BP 109/70 | HR 71 | Temp 98.6°F | Resp 16 | Wt 162.8 lb

## 2018-07-17 DIAGNOSIS — Z23 Encounter for immunization: Secondary | ICD-10-CM

## 2018-07-17 DIAGNOSIS — Z1211 Encounter for screening for malignant neoplasm of colon: Secondary | ICD-10-CM

## 2018-07-17 DIAGNOSIS — Z794 Long term (current) use of insulin: Secondary | ICD-10-CM | POA: Diagnosis not present

## 2018-07-17 DIAGNOSIS — E559 Vitamin D deficiency, unspecified: Secondary | ICD-10-CM | POA: Insufficient documentation

## 2018-07-17 DIAGNOSIS — E119 Type 2 diabetes mellitus without complications: Secondary | ICD-10-CM | POA: Insufficient documentation

## 2018-07-17 DIAGNOSIS — M069 Rheumatoid arthritis, unspecified: Secondary | ICD-10-CM

## 2018-07-17 DIAGNOSIS — Z2821 Immunization not carried out because of patient refusal: Secondary | ICD-10-CM

## 2018-07-17 DIAGNOSIS — E1165 Type 2 diabetes mellitus with hyperglycemia: Secondary | ICD-10-CM | POA: Diagnosis not present

## 2018-07-17 DIAGNOSIS — E785 Hyperlipidemia, unspecified: Secondary | ICD-10-CM | POA: Insufficient documentation

## 2018-07-17 LAB — POCT GLYCOSYLATED HEMOGLOBIN (HGB A1C): HbA1c, POC (controlled diabetic range): 6.5 % (ref 0.0–7.0)

## 2018-07-17 LAB — GLUCOSE, POCT (MANUAL RESULT ENTRY): POC Glucose: 111 mg/dl — AB (ref 70–99)

## 2018-07-17 MED ORDER — PNEUMOCOCCAL VAC POLYVALENT 25 MCG/0.5ML IJ INJ: 0.5000 mL | INJECTION | INTRAMUSCULAR | 0 refills | Status: AC

## 2018-07-17 MED ORDER — ATORVASTATIN CALCIUM 20 MG PO TABS: 20.0000 mg | ORAL_TABLET | Freq: Every day | ORAL | 11 refills | Status: DC

## 2018-07-17 MED FILL — ATORVASTATIN 20 MG TABLET: 20 | 90 days supply | Qty: 90 | Fill #2

## 2018-07-17 MED FILL — JANUMET 50-1,000 MG TABLET: 50-1000 | 90 days supply | Qty: 180 | Fill #2

## 2018-07-17 NOTE — Progress Notes (Signed)
Patient ID: Natalie Cherry, female    DOB: 1961-08-19  MRN: [id number]  CC: Diabetes   Subjective: Stefannie Defeo is a 57 y.o. female who presents for chronic ds management. Her concerns today include:  Pt with hx of DM, HL, RA  DM:  Checks BS once a day in the afternoons.  Gives range 100-120 -she has cut back on tortias and sodas.  Eating more fruits and veggies Walks daily for 1/2 hr Only taking Janumet.  Not taking Levemir.  Had eye exam 1 mth ago in [place] when she visited her daughter.  No retinopathy.    RA: apt with RA 3 mths ago was cancelled by them because she had loss medicaid.  Now has Medicaid again. -she is out of meds x 6 mths.  She was on MTX inj.  Currently on Naprosyn 500 mg and Prednisone 10 mg daily (still has enough for 2 mths per her report)  and Ibuprofen 400 mg daily.   -not taking calcium and vit D  HL:   Not taking Atorvastatin.  States she never got rxn    HM: declines flu shot.  Agreeable to Pneumovax.  Had eye exam 1 mth ago in [place].  Misplaced FIT  Patient Active Problem List   Diagnosis Date Noted  . Type 2 diabetes mellitus ([place]) 11/06/2015  . Rheumatoid arthritis ([place]) 01/29/2014  . Vitamin D deficiency 01/29/2014  . DENTAL CARIES 03/02/2009  . Osteoarthritis of spine 10/05/2007     Current Outpatient Medications on File Prior to Visit  Medication Sig Dispense Refill  . Blood Glucose Monitoring Suppl (TRUE METRIX METER) w/Device KIT Use as directed 1 kit 0  . cetirizine (ZYRTEC) 10 MG tablet Take 1 tablet (10 mg total) by mouth daily. 30 tablet 11  . fluticasone (FLONASE) 50 MCG/ACT nasal spray Place 2 sprays into both nostrils daily. 16 g 1  . folic acid (FOLVITE) 1 MG tablet Take 1 tablet (1 mg total) by mouth daily. 90 tablet 2  . glucose blood (TRUE METRIX BLOOD GLUCOSE TEST) test strip Use as instructed 100 each 12  . Insulin Pen Needle (PEN NEEDLES) 31G X 5 MM MISC 1 Dose by Does not apply route daily. 100 each 0  . Methotrexate, PF,  25 MG/0.4ML SOAJ Inject 0.7 mL [place] one day a week    . naproxen (NAPROSYN) 500 MG tablet TAKE 1 TABLET BY MOUTH 2 TIMES DAILY WITH A MEAL prn pain 60 tablet 0  . sitaGLIPtin-metformin (JANUMET) 50-1000 MG tablet Take 1 tablet by mouth 2 (two) times daily with a meal. 180 tablet 3  . TRUEPLUS LANCETS 28G MISC Use as directed 100 each 2   No current facility-administered medications on file prior to visit.     No Known Allergies  Social History   Socioeconomic History  . Marital status: Married    Spouse name: Not on file  . Number of children: Not on file  . Years of education: Not on file  . Highest education level: Not on file  Occupational History  . Not on file  Social Needs  . Financial resource strain: Not on file  . Food insecurity:    Worry: Not on file    Inability: Not on file  . Transportation needs:    Medical: Not on file    Non-medical: Not on file  Tobacco Use  . Smoking status: Never Smoker  . Smokeless tobacco: Never Used  Substance and Sexual Activity  . Alcohol use:  No  . Drug use: No  . Sexual activity: Yes  Lifestyle  . Physical activity:    Days per week: Not on file    Minutes per session: Not on file  . Stress: Not on file  Relationships  . Social connections:    Talks on phone: Not on file    Gets together: Not on file    Attends religious service: Not on file    Active member of club or organization: Not on file    Attends meetings of clubs or organizations: Not on file    Relationship status: Not on file  . Intimate partner violence:    Fear of current or ex partner: Not on file    Emotionally abused: Not on file    Physically abused: Not on file    Forced sexual activity: Not on file  Other Topics Concern  . Not on file  Social History Narrative  . Not on file    Family History  Problem Relation Age of Onset  . Diabetes Mother     No past surgical history on file.  ROS: Review of Systems Neg except as above  PHYSICAL  EXAM: BP 109/70   Pulse 71   Temp 98.6 F (37 C) (Oral)   Resp 16   Wt 162 lb 12.8 oz (73.8 kg)   SpO2 95%   BMI 27.94 kg/m   Physical Exam  General appearance - alert, well appearing, and in no distress Mental status - normal mood, behavior, speech, dress, motor activity, and thought processes Mouth - mucous membranes moist, pharynx normal without lesions Neck - supple, no significant adenopathy Chest - clear to auscultation, no wheezes, rales or rhonchi, symmetric air entry Heart - normal rate, regular rhythm, normal S1, S2, no murmurs, rubs, clicks or gallops Extremities - peripheral pulses normal, no pedal edema, no clubbing or cyanosis MSK: Enlargement of the MCP joints worse on the right side.   Results for orders placed or performed in visit on 07/17/18  POCT glucose (manual entry)  Result Value Ref Range   POC Glucose 111 (A) 70 - 99 mg/dl  POCT glycosylated hemoglobin (Hb A1C)  Result Value Ref Range   Hemoglobin A1C     HbA1c POC (<> result, manual entry)     HbA1c, POC (prediabetic range)     HbA1c, POC (controlled diabetic range) 6.5 0.0 - 7.0 %   GAD 7 : Generalized Anxiety Score 07/17/2018 04/16/2018 03/21/2018 05/10/2017  Nervous, Anxious, on Edge _0 0  Control/stop worrying 1 1 0 3  Worry too much - different things _1 Trouble relaxing 1 2 0 3  Restless 1 1 0 0  Easily annoyed or irritable _2 Afraid - awful might happen 1 0 1 0  Total GAD 7 Score _3 ASSESSMENT AND PLAN: 1. Type 2 diabetes mellitus without complication, without long-term current use of insulin (HCC) Controlled with Janumet, change in eating habits and regular exercise.  I have discontinued the Levemir since she has not been taking and blood sugars are controlled without it.  Encouraged her to keep up the good work. - POCT glucose (manual entry) - POCT glycosylated hemoglobin (Hb A1C)  2. Influenza vaccination declined  3. Colon cancer screening *- Fecal occult  blood, imunochemical([facility])  4. Rheumatoid arthritis involving multiple sites, unspecified rheumatoid factor presence ([place]) Advised to stop ibuprofen.  She should not be taking Naprosyn and  ibuprofen. We will get her back to rheumatology as soon as possible to get back on a DMARD -Recommend taking Os-Cal plus D 600/400 tablet twice a day.  I wrote this out for her so that she can take it to [facility] and purchase over-the-counter - Ambulatory referral to Rheumatology  5. Need for vaccination for Strep pneumoniae - Pneumococcal polysaccharide vaccine 23-valent greater than or equal to 2yo subcutaneous/IM  6. Hyperlipidemia, unspecified hyperlipidemia type - atorvastatin (LIPITOR) 20 MG tablet; Take 1 tablet (20 mg total) by mouth daily.  Dispense: 30 tablet; Refill: 11  Patient was given the opportunity to ask questions.  Patient verbalized understanding of the plan and was able to repeat key elements of the plan.  [employer] interpreter used during this encounter. #[id number]  Orders Placed This Encounter  Procedures  . Fecal occult blood, imunochemical([facility]/Sun[facility])  . Pneumococcal polysaccharide vaccine 23-valent greater than or equal to 2yo subcutaneous/IM  . Ambulatory referral to Rheumatology  . POCT glucose (manual entry)  . POCT glycosylated hemoglobin (Hb A1C)     Requested Prescriptions   Signed Prescriptions Disp Refills  . atorvastatin (LIPITOR) 20 MG tablet 30 tablet 11    Sig: Take 1 tablet (20 mg total) by mouth daily.  . pneumococcal 23 valent vaccine (PNEUMOVAX 23) 25 MCG/0.5ML injection 2.5 mL 0    Sig: Inject 0.5 mLs into the muscle tomorrow at 10 am for 1 dose.    Return in about 3 months (around 10/17/2018).  Karle Plumber, MD, FACP

## 2018-07-17 NOTE — Progress Notes (Signed)
Pt states she is having pain in her hands and feet

## 2018-07-17 NOTE — Patient Instructions (Addendum)
Purchase [place] & Vitamin D 600/400 one tab twice a day  Pneumococcal Polysaccharide Vaccine: What You Need to Know 1. Why get vaccinated? Vaccination can protect older adults (and some children and younger adults) from pneumococcal disease. Pneumococcal disease is caused by bacteria that can spread from person to person through close contact. It can cause ear infections, and it can also lead to more serious infections of the:  Lungs (pneumonia),  Blood (bacteremia), and  Covering of the brain and spinal cord (meningitis). Meningitis can cause deafness and brain damage, and it can be fatal.  Anyone can get pneumococcal disease, but children under 25 years of age, people with certain medical conditions, adults over 52 years of age, and cigarette smokers are at the highest risk. About 18,000 older adults die each year from pneumococcal disease in the [place]. Treatment of pneumococcal infections with penicillin and other drugs used to be more effective. But some strains of the disease have become resistant to these drugs. This makes prevention of the disease, through vaccination, even more important. 2. Pneumococcal polysaccharide vaccine (PPSV23) Pneumococcal polysaccharide vaccine (PPSV23) protects against 23 types of pneumococcal bacteria. It will not prevent all pneumococcal disease. PPSV23 is recommended for:  All adults 30 years of age and older,  Anyone 2 through 58 years of age with certain long-term health problems,  Anyone 2 through 58 years of age with a weakened immune system,  Adults 79 through 58 years of age who smoke cigarettes or have asthma.  Most people need only one dose of PPSV. A second dose is recommended for certain high-risk groups. People 19 and older should get a dose even if they have gotten one or more doses of the vaccine before they turned 65. Your healthcare provider can give you more information about these recommendations. Most healthy adults develop  protection within 2 to 3 weeks of getting the shot. 3. Some people should not get this vaccine  Anyone who has had a life-threatening allergic reaction to PPSV should not get another dose.  Anyone who has a severe allergy to any component of PPSV should not receive it. Tell your provider if you have any severe allergies.  Anyone who is moderately or severely ill when the shot is scheduled may be asked to wait until they recover before getting the vaccine. Someone with a mild illness can usually be vaccinated.  Children less than 69 years of age should not receive this vaccine.  There is no evidence that PPSV is harmful to either a pregnant woman or to her fetus. However, as a precaution, women who need the vaccine should be vaccinated before becoming pregnant, if possible. 4. Risks of a vaccine reaction With any medicine, including vaccines, there is a chance of side effects. These are usually mild and go away on their own, but serious reactions are also possible. About half of people who get PPSV have mild side effects, such as redness or pain where the shot is given, which go away within about two days. Less than 1 out of 100 people develop a fever, muscle aches, or more severe local reactions. Problems that could happen after any vaccine:  People sometimes faint after a medical procedure, including vaccination. Sitting or lying down for about 15 minutes can help prevent fainting, and injuries caused by a fall. Tell your doctor if you feel dizzy, or have vision changes or ringing in the ears.  Some people get severe pain in the shoulder and have difficulty moving the  arm where a shot was given. This happens very rarely.  Any medication can cause a severe allergic reaction. Such reactions from a vaccine are very rare, estimated at about 1 in a million doses, and would happen within a few minutes to a few hours after the vaccination. As with any medicine, there is a very remote chance of a  vaccine causing a serious injury or death. The safety of vaccines is always being monitored. For more information, visit: http://floyd.org/ 5. What if there is a serious reaction? What should I look for? Look for anything that concerns you, such as signs of a severe allergic reaction, very high fever, or unusual behavior. Signs of a severe allergic reaction can include hives, swelling of the face and throat, difficulty breathing, a fast heartbeat, dizziness, and weakness. These would usually start a few minutes to a few hours after the vaccination. What should I do? If you think it is a severe allergic reaction or other emergency that can't wait, call 9-1-1 or get to the nearest hospital. Otherwise, call your doctor. Afterward, the reaction should be reported to the Vaccine Adverse Event Reporting System (VAERS). Your doctor might file this report, or you can do it yourself through the VAERS web site at www.vaers.LAgents.no, or by calling 1-301-388-8719. VAERS does not give medical advice. 6. How can I learn more?  Ask your doctor. He or she can give you the vaccine package insert or suggest other sources of information.  Call your local or state health department.  Contact the Centers for Disease Control and Prevention (CDC): ? Call 323-609-0794 (1-800-CDC-INFO) or ? Visit CDC's website at PicCapture.uy CDC Pneumococcal Polysaccharide Vaccine VIS (03/21/14) This information is not intended to replace advice given to you by your health care provider. Make sure you discuss any questions you have with your health care provider. Document Released: 09/11/2006 Document Revised: 08/04/2016 Document Reviewed: 08/04/2016 Elsevier Interactive Patient Education  2017 [employer].

## 2018-07-31 DIAGNOSIS — M25511 Pain in right shoulder: Secondary | ICD-10-CM | POA: Diagnosis not present

## 2018-07-31 DIAGNOSIS — E785 Hyperlipidemia, unspecified: Secondary | ICD-10-CM | POA: Diagnosis not present

## 2018-07-31 DIAGNOSIS — M19042 Primary osteoarthritis, left hand: Secondary | ICD-10-CM | POA: Diagnosis not present

## 2018-07-31 DIAGNOSIS — M069 Rheumatoid arthritis, unspecified: Secondary | ICD-10-CM | POA: Diagnosis not present

## 2018-07-31 DIAGNOSIS — M19041 Primary osteoarthritis, right hand: Secondary | ICD-10-CM | POA: Diagnosis not present

## 2018-07-31 DIAGNOSIS — E119 Type 2 diabetes mellitus without complications: Secondary | ICD-10-CM | POA: Diagnosis not present

## 2018-07-31 DIAGNOSIS — M810 Age-related osteoporosis without current pathological fracture: Secondary | ICD-10-CM | POA: Diagnosis not present

## 2018-07-31 DIAGNOSIS — M19012 Primary osteoarthritis, left shoulder: Secondary | ICD-10-CM | POA: Diagnosis not present

## 2018-07-31 DIAGNOSIS — M25512 Pain in left shoulder: Secondary | ICD-10-CM | POA: Diagnosis not present

## 2018-07-31 DIAGNOSIS — M19011 Primary osteoarthritis, right shoulder: Secondary | ICD-10-CM | POA: Diagnosis not present

## 2018-07-31 DIAGNOSIS — M1990 Unspecified osteoarthritis, unspecified site: Secondary | ICD-10-CM | POA: Diagnosis not present

## 2018-07-31 DIAGNOSIS — M79641 Pain in right hand: Secondary | ICD-10-CM | POA: Diagnosis not present

## 2018-07-31 DIAGNOSIS — I10 Essential (primary) hypertension: Secondary | ICD-10-CM | POA: Diagnosis not present

## 2018-07-31 DIAGNOSIS — M0589 Other rheumatoid arthritis with rheumatoid factor of multiple sites: Secondary | ICD-10-CM | POA: Diagnosis not present

## 2018-07-31 DIAGNOSIS — M79642 Pain in left hand: Secondary | ICD-10-CM | POA: Diagnosis not present

## 2018-08-08 DIAGNOSIS — M1990 Unspecified osteoarthritis, unspecified site: Secondary | ICD-10-CM | POA: Diagnosis not present

## 2018-08-08 DIAGNOSIS — I10 Essential (primary) hypertension: Secondary | ICD-10-CM | POA: Diagnosis not present

## 2018-08-08 DIAGNOSIS — E119 Type 2 diabetes mellitus without complications: Secondary | ICD-10-CM | POA: Diagnosis not present

## 2018-08-08 DIAGNOSIS — E785 Hyperlipidemia, unspecified: Secondary | ICD-10-CM | POA: Diagnosis not present

## 2018-08-08 DIAGNOSIS — M069 Rheumatoid arthritis, unspecified: Secondary | ICD-10-CM | POA: Diagnosis not present

## 2018-08-08 DIAGNOSIS — R7989 Other specified abnormal findings of blood chemistry: Secondary | ICD-10-CM | POA: Diagnosis not present

## 2018-08-09 ENCOUNTER — Telehealth: Payer: Self-pay

## 2018-08-09 NOTE — Telephone Encounter (Signed)
Danette from Dr. Kathi Ludwig office called and wanted to inform Dr. Laural Benes that they drew pt labs and her liver function was evaluated. Per Danette Dr. Kathi Ludwig informed pt to stop naproxen and cholesterol medication because she think that is why her liver function is abnormal. Per Dr. Kathi Ludwig she would like her to be on different medications. Dr. Kathi Ludwig thinks once her medications are changed and they redraw her liver function if it's normal they can go ahead with treatment. They want to do the arthritis treatment but can't because of her liver function.  They have drawn an hep panel just in case and they are waiting for the results. They wanted to put her on Methotrexate but can't so they will think of another medication to put pt on once labs and medications are changed.    Dr. Lazarus Gowda stated she faxed the results yesterday and once I receive the results I will give them to you.

## 2018-08-30 ENCOUNTER — Encounter: Payer: Self-pay | Admitting: Internal Medicine

## 2018-08-30 ENCOUNTER — Ambulatory Visit: Payer: Medicare Other | Attending: Internal Medicine | Admitting: Internal Medicine

## 2018-08-30 VITALS — BP 112/73 | HR 63 | Temp 98.5°F | Ht 64.0 in | Wt 164.8 lb

## 2018-08-30 DIAGNOSIS — Z1239 Encounter for other screening for malignant neoplasm of breast: Secondary | ICD-10-CM

## 2018-08-30 DIAGNOSIS — E785 Hyperlipidemia, unspecified: Secondary | ICD-10-CM

## 2018-08-30 DIAGNOSIS — Z1211 Encounter for screening for malignant neoplasm of colon: Secondary | ICD-10-CM | POA: Diagnosis not present

## 2018-08-30 DIAGNOSIS — M069 Rheumatoid arthritis, unspecified: Secondary | ICD-10-CM | POA: Diagnosis not present

## 2018-08-30 DIAGNOSIS — E559 Vitamin D deficiency, unspecified: Secondary | ICD-10-CM | POA: Insufficient documentation

## 2018-08-30 DIAGNOSIS — Z794 Long term (current) use of insulin: Secondary | ICD-10-CM | POA: Insufficient documentation

## 2018-08-30 DIAGNOSIS — Z79899 Other long term (current) drug therapy: Secondary | ICD-10-CM | POA: Insufficient documentation

## 2018-08-30 DIAGNOSIS — M0600 Rheumatoid arthritis without rheumatoid factor, unspecified site: Secondary | ICD-10-CM | POA: Diagnosis not present

## 2018-08-30 DIAGNOSIS — E119 Type 2 diabetes mellitus without complications: Secondary | ICD-10-CM

## 2018-08-30 DIAGNOSIS — Z7951 Long term (current) use of inhaled steroids: Secondary | ICD-10-CM | POA: Insufficient documentation

## 2018-08-30 LAB — POCT GLYCOSYLATED HEMOGLOBIN (HGB A1C): Hemoglobin A1C: 6.8 % — AB (ref 4.0–5.6)

## 2018-08-30 LAB — GLUCOSE, POCT (MANUAL RESULT ENTRY): POC Glucose: 106 mg/dl — AB (ref 70–99)

## 2018-08-30 MED ORDER — FOLIC ACID 1 MG PO TABS: 1.0000 mg | ORAL_TABLET | Freq: Every day | ORAL | 2 refills | Status: DC

## 2018-08-30 NOTE — Progress Notes (Signed)
Patient is here to follow-up on her diabetes. Patient have not ate anything yet.

## 2018-08-30 NOTE — Progress Notes (Signed)
Patient ID: Natalie Cherry, female    DOB: [birth year]  MRN: [id number]  CC: Follow-up   Subjective: Natalie Cherry is a 57 y.o. female who presents for chronic ds management.  Her concerns today include:  Pt with hx of DM, HL, RA  RA:  Saw Rheumatology Dr. Rogers Blocker at [facility] since last visit.  Started on  Humira inj Q 2wks, Prednisone 10 mg daily, Naprosyn PRN and [place].  Still on MTX but out of Folic acid.  DM:  BS 95-100.  Checks BS Q 3 days.  Walking daily for 30 mins Does well with eating habits. Compliant with Janumet.    HL:  Compliant with and tolerating Lipitor  HM:  Mailed in FIT test last wk.  I have not received results as yet  Patient Active Problem List   Diagnosis Date Noted  . Hyperlipidemia 07/17/2018  . Type 2 diabetes mellitus ([place]) 11/06/2015  . Rheumatoid arthritis ([place]) 01/29/2014  . Vitamin D deficiency 01/29/2014  . DENTAL CARIES 03/02/2009  . Osteoarthritis of spine 10/05/2007     Current Outpatient Medications on File Prior to Visit  Medication Sig Dispense Refill  . Adalimumab 40 MG/0.8ML PNKT Inject into the skin.    Marland Kitchen atorvastatin (LIPITOR) 20 MG tablet Take 1 tablet (20 mg total) by mouth daily. 30 tablet 11  . Blood Glucose Monitoring Suppl (TRUE METRIX METER) w/Device KIT Use as directed 1 kit 0  . cetirizine (ZYRTEC) 10 MG tablet Take 1 tablet (10 mg total) by mouth daily. 30 tablet 11  . fluticasone (FLONASE) 50 MCG/ACT nasal spray Place 2 sprays into both nostrils daily. 16 g 1  . glucose blood (TRUE METRIX BLOOD GLUCOSE TEST) test strip Use as instructed 100 each 12  . Methotrexate, PF, 25 MG/0.4ML SOAJ Inject 0.7 mL [place] one day a week    . naproxen (NAPROSYN) 500 MG tablet TAKE 1 TABLET BY MOUTH 2 TIMES DAILY WITH A MEAL prn pain 60 tablet 0  . sitaGLIPtin-metformin (JANUMET) 50-1000 MG tablet Take 1 tablet by mouth 2 (two) times daily with a meal. 180 tablet 3  . TRUEPLUS LANCETS 28G MISC Use as directed 100 each 2  . Insulin Pen Needle (PEN  NEEDLES) 31G X 5 MM MISC 1 Dose by Does not apply route daily. (Patient not taking: Reported on 08/30/2018) 100 each 0   No current facility-administered medications on file prior to visit.     No Known Allergies  Social History   Socioeconomic History  . Marital status: Married    Spouse name: Not on file  . Number of children: Not on file  . Years of education: Not on file  . Highest education level: Not on file  Occupational History  . Not on file  Social Needs  . Financial resource strain: Not on file  . Food insecurity:    Worry: Not on file    Inability: Not on file  . Transportation needs:    Medical: Not on file    Non-medical: Not on file  Tobacco Use  . Smoking status: Never Smoker  . Smokeless tobacco: Never Used  Substance and Sexual Activity  . Alcohol use: No  . Drug use: No  . Sexual activity: Yes  Lifestyle  . Physical activity:    Days per week: Not on file    Minutes per session: Not on file  . Stress: Not on file  Relationships  . Social connections:    Talks on phone: Not on  file    Gets together: Not on file    Attends religious service: Not on file    Active member of club or organization: Not on file    Attends meetings of clubs or organizations: Not on file    Relationship status: Not on file  . Intimate partner violence:    Fear of current or ex partner: Not on file    Emotionally abused: Not on file    Physically abused: Not on file    Forced sexual activity: Not on file  Other Topics Concern  . Not on file  Social History Narrative  . Not on file    Family History  Problem Relation Age of Onset  . Diabetes Mother     History reviewed. No pertinent surgical history.  ROS: Review of Systems Negative except as above. PHYSICAL EXAM: BP 112/73 (BP Location: Left Arm, Patient Position: Sitting, Cuff Size: Normal)   Pulse 63   Temp 98.5 F (36.9 C) (Oral)   Ht 5' 4" (1.626 m)   Wt 164 lb 12.8 oz (74.8 kg)   SpO2 95%   BMI  28.29 kg/m   Wt Readings from Last 3 Encounters:  08/30/18 164 lb 12.8 oz (74.8 kg)  07/17/18 162 lb 12.8 oz (73.8 kg)  04/16/18 168 lb 9.6 oz (76.5 kg)    Physical Exam  General appearance - alert, well appearing, and in no distress Mental status - normal mood, behavior, speech, dress, motor activity, and thought processes Chest - clear to auscultation, no wheezes, rales or rhonchi, symmetric air entry Heart - normal rate, regular rhythm, normal S1, S2, no murmurs, rubs, clicks or gallops Musculoskeletal -enlargement of the MCP joints on both hands more prominent on the first MCP joint bilaterally Extremities - peripheral pulses normal, no pedal edema, no clubbing or cyanosis Diabetic Foot Exam - Simple   Simple Foot Form Visual Inspection No deformities, no ulcerations, no other skin breakdown bilaterally:  Yes Sensation Testing Intact to touch and monofilament testing bilaterally:  Yes Pulse Check Posterior Tibialis and Dorsalis pulse intact bilaterally:  Yes Comments     Depression screen [facility] 2/9 07/17/2018 04/16/2018 03/21/2018  Decreased Interest 2 0 3  Down, Depressed, Hopeless 2 0 1  PHQ - 2 Score 4 0 4  Altered sleeping _0 Tired, decreased energy _1 Change in appetite _2 Feeling bad or failure about yourself  2 0 1  Trouble concentrating _3 Moving slowly or fidgety/restless (No Data) 3 0  Suicidal thoughts 0 0 0  PHQ-9 Score _4 Results for orders placed or performed in visit on 08/30/18  Glucose (CBG)  Result Value Ref Range   POC Glucose 106 (A) 70 - 99 mg/dl  HgB A1c  Result Value Ref Range   Hemoglobin A1C 6.8 (A) 4.0 - 5.6 %   HbA1c POC (<> result, manual entry)     HbA1c, POC (prediabetic range)     HbA1c, POC (controlled diabetic range)       Chemistry      Component Value Date/Time   NA 140 04/16/2018 1005   K 4.3 04/16/2018 1005   CL 104 04/16/2018 1005   CO2 21 04/16/2018 1005   BUN 14 04/16/2018 1005   CREATININE  0.69 04/16/2018 1005   CREATININE 0.80 12/13/2016 1149      Component Value Date/Time   CALCIUM 9.3 04/16/2018 1005   ALKPHOS 104  04/16/2018 1005   AST 15 04/16/2018 1005   ALT 17 04/16/2018 1005   BILITOT <0.2 04/16/2018 1005     Lab Results  Component Value Date   WBC 6.4 04/16/2018   HGB 12.3 04/16/2018   HCT 37.1 04/16/2018   MCV 87 04/16/2018   PLT 347 04/16/2018    ASSESSMENT AND PLAN: 1. Type 2 diabetes mellitus without complication, without long-term current use of insulin (HCC) Well-controlled.  Patient to continue healthy eating habits regular exercise and Janumet. - Glucose (CBG) - HgB A1c  2. Rheumatoid arthritis with negative rheumatoid factor, involving unspecified site (HCC) Refill folic acid.  Patient informed of the importance of taking this as long as she is on methotrexate. Med list updated to include Humira - folic acid (FOLVITE) 1 MG tablet; Take 1 tablet (1 mg total) by mouth daily.  Dispense: 90 tablet; Refill: 2  3. Colon cancer screening Await results of fit test that she mailed in the last week.  4. Hyperlipidemia, unspecified hyperlipidemia type Continue atorvastatin.  5. Breast cancer screening Mammogram was ordered in May of this year.  My CMA will try to get this scheduled for her.   Patient was given the opportunity to ask questions.  Patient verbalized understanding of the plan and was able to repeat key elements of the plan.   Orders Placed This Encounter  Procedures  . Glucose (CBG)  . HgB A1c     Requested Prescriptions   Signed Prescriptions Disp Refills  . folic acid (FOLVITE) 1 MG tablet 90 tablet 2    Sig: Take 1 tablet (1 mg total) by mouth daily.    Return in about 3 months (around 11/30/2018).  Karle Plumber, MD, FACP

## 2018-09-01 LAB — SPECIMEN STATUS REPORT

## 2018-09-01 LAB — FECAL OCCULT BLOOD, IMMUNOCHEMICAL: Fecal Occult Bld: NEGATIVE

## 2018-10-01 ENCOUNTER — Ambulatory Visit: Payer: Medicare Other

## 2018-10-03 ENCOUNTER — Other Ambulatory Visit: Payer: Self-pay | Admitting: Physician Assistant

## 2018-10-03 ENCOUNTER — Other Ambulatory Visit: Payer: Self-pay | Admitting: Internal Medicine

## 2018-10-03 DIAGNOSIS — E119 Type 2 diabetes mellitus without complications: Secondary | ICD-10-CM

## 2018-10-03 DIAGNOSIS — M0600 Rheumatoid arthritis without rheumatoid factor, unspecified site: Secondary | ICD-10-CM

## 2018-10-03 DIAGNOSIS — E1165 Type 2 diabetes mellitus with hyperglycemia: Secondary | ICD-10-CM

## 2018-10-03 MED ORDER — SITAGLIPTIN PHOS-METFORMIN HCL 50-1000 MG PO TABS: 1.0000 | ORAL_TABLET | Freq: Two times a day (BID) | ORAL | 3 refills | Status: DC

## 2018-10-03 MED ORDER — TRUE METRIX METER W/DEVICE KIT: PACK | 0 refills | Status: DC

## 2018-10-03 NOTE — Telephone Encounter (Signed)
[employer] Natalie Cherry Id#  [id number] contacted pt to go over Dr. Laural Benes response regarding medication pt didn't answer left a detailed vm and if she has any questions or concerns to give me a call

## 2018-10-03 NOTE — Telephone Encounter (Signed)
Refill request, also for Janumet

## 2018-10-16 ENCOUNTER — Other Ambulatory Visit: Payer: Self-pay | Admitting: Physician Assistant

## 2018-10-16 ENCOUNTER — Other Ambulatory Visit: Payer: Self-pay | Admitting: Internal Medicine

## 2018-10-16 DIAGNOSIS — E1165 Type 2 diabetes mellitus with hyperglycemia: Secondary | ICD-10-CM

## 2018-10-16 DIAGNOSIS — M0600 Rheumatoid arthritis without rheumatoid factor, unspecified site: Secondary | ICD-10-CM

## 2018-10-16 MED FILL — JANUMET 50-1,000 MG TABLET: 50-1000 | 30 days supply | Qty: 60 | Fill #0

## 2018-10-16 NOTE — Telephone Encounter (Signed)
Pt kit was sent on 10/06/2018

## 2018-10-30 DIAGNOSIS — M1990 Unspecified osteoarthritis, unspecified site: Secondary | ICD-10-CM | POA: Diagnosis not present

## 2018-10-30 DIAGNOSIS — E119 Type 2 diabetes mellitus without complications: Secondary | ICD-10-CM | POA: Diagnosis not present

## 2018-10-30 DIAGNOSIS — E785 Hyperlipidemia, unspecified: Secondary | ICD-10-CM | POA: Diagnosis not present

## 2018-10-30 DIAGNOSIS — M25519 Pain in unspecified shoulder: Secondary | ICD-10-CM | POA: Diagnosis not present

## 2018-10-30 DIAGNOSIS — S4992XA Unspecified injury of left shoulder and upper arm, initial encounter: Secondary | ICD-10-CM | POA: Diagnosis not present

## 2018-10-30 DIAGNOSIS — R7989 Other specified abnormal findings of blood chemistry: Secondary | ICD-10-CM | POA: Diagnosis not present

## 2018-10-30 DIAGNOSIS — M069 Rheumatoid arthritis, unspecified: Secondary | ICD-10-CM | POA: Diagnosis not present

## 2018-10-30 DIAGNOSIS — I10 Essential (primary) hypertension: Secondary | ICD-10-CM | POA: Diagnosis not present

## 2018-10-30 DIAGNOSIS — M25512 Pain in left shoulder: Secondary | ICD-10-CM | POA: Diagnosis not present

## 2018-11-08 ENCOUNTER — Ambulatory Visit
Admission: RE | Admit: 2018-11-08 | Discharge: 2018-11-08 | Disposition: A | Discharge status: Home / Self Care | Payer: Medicare Other | Source: Ambulatory Visit | Attending: Internal Medicine | Admitting: Internal Medicine

## 2018-11-08 DIAGNOSIS — Z1231 Encounter for screening mammogram for malignant neoplasm of breast: Secondary | ICD-10-CM | POA: Diagnosis not present

## 2018-11-08 DIAGNOSIS — Z1239 Encounter for other screening for malignant neoplasm of breast: Secondary | ICD-10-CM

## 2018-11-30 ENCOUNTER — Ambulatory Visit: Payer: Medicare Other | Admitting: Internal Medicine

## 2018-12-04 ENCOUNTER — Ambulatory Visit: Payer: Medicare Other | Admitting: Internal Medicine

## 2018-12-05 DIAGNOSIS — E785 Hyperlipidemia, unspecified: Secondary | ICD-10-CM | POA: Diagnosis not present

## 2018-12-05 DIAGNOSIS — M25519 Pain in unspecified shoulder: Secondary | ICD-10-CM | POA: Diagnosis not present

## 2018-12-05 DIAGNOSIS — E119 Type 2 diabetes mellitus without complications: Secondary | ICD-10-CM | POA: Diagnosis not present

## 2018-12-05 DIAGNOSIS — M810 Age-related osteoporosis without current pathological fracture: Secondary | ICD-10-CM | POA: Diagnosis not present

## 2018-12-05 DIAGNOSIS — Z79899 Other long term (current) drug therapy: Secondary | ICD-10-CM | POA: Diagnosis not present

## 2018-12-05 DIAGNOSIS — M069 Rheumatoid arthritis, unspecified: Secondary | ICD-10-CM | POA: Diagnosis not present

## 2018-12-05 DIAGNOSIS — I10 Essential (primary) hypertension: Secondary | ICD-10-CM | POA: Diagnosis not present

## 2018-12-05 DIAGNOSIS — M1990 Unspecified osteoarthritis, unspecified site: Secondary | ICD-10-CM | POA: Diagnosis not present

## 2018-12-07 ENCOUNTER — Encounter: Payer: Self-pay | Admitting: Internal Medicine

## 2018-12-07 ENCOUNTER — Ambulatory Visit: Payer: Medicare Other | Attending: Internal Medicine | Admitting: Internal Medicine

## 2018-12-07 VITALS — BP 132/77 | HR 67 | Temp 98.4°F | Resp 16 | Ht 64.0 in | Wt 171.6 lb

## 2018-12-07 DIAGNOSIS — M810 Age-related osteoporosis without current pathological fracture: Secondary | ICD-10-CM | POA: Diagnosis not present

## 2018-12-07 DIAGNOSIS — Z79899 Other long term (current) drug therapy: Secondary | ICD-10-CM | POA: Insufficient documentation

## 2018-12-07 DIAGNOSIS — Z7984 Long term (current) use of oral hypoglycemic drugs: Secondary | ICD-10-CM | POA: Diagnosis not present

## 2018-12-07 DIAGNOSIS — Z794 Long term (current) use of insulin: Secondary | ICD-10-CM | POA: Insufficient documentation

## 2018-12-07 DIAGNOSIS — E663 Overweight: Secondary | ICD-10-CM | POA: Diagnosis not present

## 2018-12-07 DIAGNOSIS — H9312 Tinnitus, left ear: Secondary | ICD-10-CM | POA: Diagnosis not present

## 2018-12-07 DIAGNOSIS — M0579 Rheumatoid arthritis with rheumatoid factor of multiple sites without organ or systems involvement: Secondary | ICD-10-CM

## 2018-12-07 DIAGNOSIS — E785 Hyperlipidemia, unspecified: Secondary | ICD-10-CM | POA: Insufficient documentation

## 2018-12-07 DIAGNOSIS — E1165 Type 2 diabetes mellitus with hyperglycemia: Secondary | ICD-10-CM | POA: Diagnosis not present

## 2018-12-07 LAB — POCT GLYCOSYLATED HEMOGLOBIN (HGB A1C): HbA1c, POC (controlled diabetic range): 8.1 % — AB (ref 0.0–7.0)

## 2018-12-07 LAB — GLUCOSE, POCT (MANUAL RESULT ENTRY): POC Glucose: 253 mg/dl — AB (ref 70–99)

## 2018-12-07 MED ORDER — GLIMEPIRIDE 2 MG PO TABS: 2.0000 mg | ORAL_TABLET | Freq: Every day | ORAL | 3 refills | Status: DC

## 2018-12-07 MED FILL — JANUMET 50-1,000 MG TABLET: 50-1000 | 90 days supply | Qty: 180 | Fill #3

## 2018-12-07 MED FILL — GLIMEPIRIDE 2 MG TABS: 2 | 90 days supply | Qty: 90 | Fill #0

## 2018-12-07 NOTE — Progress Notes (Signed)
Patient ID: Natalie Cherry, female    DOB: [birth year]  MRN: [id number]  CC: No chief complaint on file.   Subjective: Natalie Cherry is a 58 y.o. female who presents for chronic ds management.  Daughter, Natalie Cherry, is with her and interprets Her concerns today include:  Pt with hx of DM, HL, RA  RA:  Changed rhematologist.  She is now being followed by Dr. Sabino Niemann.  Last seen 12/05/2018 Dx with osteoporosis and was given a shot of Prolia 60 mg/ml   DM:  Checks BS 2-3 x a wk.  Gives range 120-135 Eats only twice a day.  She has cut back on tortias and bread.  No sodas.  Ate more rice, pork and tamales over the holidays Walks for about 15 mins daily Reports compliance with Janumet Last eye exam was 1 yr ago  HL: compliant with and tolerating Lipitor  FIT done 08/2018 and was negative.  Complains of buzzing noise in left ear constantly x2 weeks.  It is worse at nights.  It is associated with some decreased hearing.  Occasional pain.   Patient Active Problem List   Diagnosis Date Noted  . Hyperlipidemia 07/17/2018  . Type 2 diabetes mellitus ([place]) 11/06/2015  . Rheumatoid arthritis ([place]) 01/29/2014  . Vitamin D deficiency 01/29/2014  . DENTAL CARIES 03/02/2009  . Osteoarthritis of spine 10/05/2007     Current Outpatient Medications on File Prior to Visit  Medication Sig Dispense Refill  . Adalimumab 40 MG/0.8ML PNKT Inject into the skin.    Marland Kitchen atorvastatin (LIPITOR) 20 MG tablet Take 1 tablet (20 mg total) by mouth daily. 30 tablet 11  . Blood Glucose Monitoring Suppl (TRUE METRIX METER) w/Device KIT Use as directed 1 kit 0  . cetirizine (ZYRTEC) 10 MG tablet Take 1 tablet (10 mg total) by mouth daily. (Patient not taking: Reported on 12/07/2018) 30 tablet 11  . fluticasone (FLONASE) 50 MCG/ACT nasal spray Place 2 sprays into both nostrils daily. (Patient not taking: Reported on 01/17/2541) 16 g 1  . folic acid (FOLVITE) 1 MG tablet Take 1 tablet (1 mg total) by mouth daily. 90  tablet 2  . glucose blood (TRUE METRIX BLOOD GLUCOSE TEST) test strip Use as instructed 100 each 12  . Insulin Pen Needle (PEN NEEDLES) 31G X 5 MM MISC 1 Dose by Does not apply route daily. (Patient not taking: Reported on 08/30/2018) 100 each 0  . Methotrexate, PF, 25 MG/0.4ML SOAJ Inject 0.7 mL [place] one day a week    . naproxen (NAPROSYN) 500 MG tablet TAKE 1 TABLET BY MOUTH 2 TIMES DAILY WITH A MEAL prn pain 60 tablet 0  . sitaGLIPtin-metformin (JANUMET) 50-1000 MG tablet Take 1 tablet by mouth 2 (two) times daily with a meal. 180 tablet 3  . TRUEPLUS LANCETS 28G MISC Use as directed 100 each 2   No current facility-administered medications on file prior to visit.     No Known Allergies  Social History   Socioeconomic History  . Marital status: Married    Spouse name: Not on file  . Number of children: Not on file  . Years of education: Not on file  . Highest education level: Not on file  Occupational History  . Not on file  Social Needs  . Financial resource strain: Not on file  . Food insecurity:    Worry: Not on file    Inability: Not on file  . Transportation needs:    Medical: Not on  file    Non-medical: Not on file  Tobacco Use  . Smoking status: Never Smoker  . Smokeless tobacco: Never Used  Substance and Sexual Activity  . Alcohol use: No  . Drug use: No  . Sexual activity: Yes  Lifestyle  . Physical activity:    Days per week: Not on file    Minutes per session: Not on file  . Stress: Not on file  Relationships  . Social connections:    Talks on phone: Not on file    Gets together: Not on file    Attends religious service: Not on file    Active member of club or organization: Not on file    Attends meetings of clubs or organizations: Not on file    Relationship status: Not on file  . Intimate partner violence:    Fear of current or ex partner: Not on file    Emotionally abused: Not on file    Physically abused: Not on file    Forced sexual activity: Not  on file  Other Topics Concern  . Not on file  Social History Narrative  . Not on file    Family History  Problem Relation Age of Onset  . Diabetes Mother     ROS: Review of Systems Negative except as above  PHYSICAL EXAM: BP 132/77   Pulse 67   Temp 98.4 F (36.9 C) (Oral)   Resp 16   Ht '5\' 4"'  (1.626 m)   Wt 171 lb 9.6 oz (77.8 kg)   SpO2 96%   BMI 29.46 kg/m   Wt Readings from Last 3 Encounters:  12/07/18 171 lb 9.6 oz (77.8 kg)  08/30/18 164 lb 12.8 oz (74.8 kg)  07/17/18 162 lb 12.8 oz (73.8 kg)    Physical Exam  General appearance - alert, well appearing, and in no distress Mental status - normal mood, behavior, speech, dress, motor activity, and thought processes Ears -small amount of wax buildup in both ears obscuring view of the eardrum. Nose - normal and patent, no erythema, discharge or polyps Mouth - mucous membranes moist, pharynx normal without lesions Neck - supple, no significant adenopathy Chest - clear to auscultation, no wheezes, rales or rhonchi, symmetric air entry Heart - normal rate, regular rhythm, normal S1, S2, no murmurs, rubs, clicks or gallops Extremities - peripheral pulses normal, no pedal edema, no clubbing or cyanosis  Results for orders placed or performed in visit on 12/07/18  POCT glucose (manual entry)  Result Value Ref Range   POC Glucose 253 (A) 70 - 99 mg/dl  POCT glycosylated hemoglobin (Hb A1C)  Result Value Ref Range   Hemoglobin A1C     HbA1c POC (<> result, manual entry)     HbA1c, POC (prediabetic range)     HbA1c, POC (controlled diabetic range) 8.1 (A) 0.0 - 7.0 %   ASSESSMENT AND PLAN: 1. Uncontrolled type 2 diabetes mellitus with hyperglycemia ([place]) Not at goal.  Dietary counseling discussed.  Continue regular exercise as tolerated.  Add low-dose Amaryl.  Continue Janumet. - POCT glucose (manual entry) - POCT glycosylated hemoglobin (Hb A1C) - Ambulatory referral to Ophthalmology - glimepiride (AMARYL) 2 MG  tablet; Take 1 tablet (2 mg total) by mouth daily before breakfast.  Dispense: 30 tablet; Refill: 3  2. Rheumatoid arthritis involving multiple sites with positive rheumatoid factor ([place]) Followed by rheumatology.  3. Osteoporosis without current pathological fracture, unspecified osteoporosis type Recently started on Prolia by rheumatology.  4. Tinnitus of left  ear - Ambulatory referral to ENT  5. Over weight See #1 above.  Patient was given the opportunity to ask questions.  Patient verbalized understanding of the plan and was able to repeat key elements of the plan.   Orders Placed This Encounter  Procedures  . POCT glucose (manual entry)  . POCT glycosylated hemoglobin (Hb A1C)     Requested Prescriptions    No prescriptions requested or ordered in this encounter    No follow-ups on file.  Karle Plumber, MD, FACP

## 2018-12-07 NOTE — Patient Instructions (Signed)
Your diabetes is not well controlled.  We have added a new medication called glimepiride that will take once a day.  Follow a Healthy Eating Plan - You can do it! Limit sugary drinks.  Avoid sodas, sweet tea, sport or energy drinks, or fruit drinks.  Drink water, lo-fat milk, or diet drinks. Limit snack foods.   Cut back on candy, cake, cookies, chips, ice cream.  These are a special treat, only in small amounts. Eat plenty of vegetables.  Especially dark green, red, and orange vegetables. Aim for at least 3 servings a day. More is better! Include fruit in your daily diet.  Whole fruit is much healthier than fruit juice! Limit "white" bread, "white" pasta, "white" rice.   Choose "100% whole grain" products, brown or wild rice. Avoid fatty meats. Try "Meatless Monday" and choose eggs or beans one day a week.  When eating meat, choose lean meats like chicken, [place], and fish.  Grill, broil, or bake meats instead of frying, and eat poultry without the skin. Eat less salt.  Avoid frozen pizzas, frozen dinners and salty foods.  Use seasonings other than salt in cooking.  This can help blood pressure and keep you from swelling Beer, wine and liquor have calories.  If you can safely drink alcohol, limit to 1 drink per day for women, 2 drinks for men

## 2019-01-07 DIAGNOSIS — M1990 Unspecified osteoarthritis, unspecified site: Secondary | ICD-10-CM | POA: Diagnosis not present

## 2019-01-07 DIAGNOSIS — Z79899 Other long term (current) drug therapy: Secondary | ICD-10-CM | POA: Diagnosis not present

## 2019-01-07 DIAGNOSIS — E119 Type 2 diabetes mellitus without complications: Secondary | ICD-10-CM | POA: Diagnosis not present

## 2019-01-07 DIAGNOSIS — M069 Rheumatoid arthritis, unspecified: Secondary | ICD-10-CM | POA: Diagnosis not present

## 2019-01-07 DIAGNOSIS — E785 Hyperlipidemia, unspecified: Secondary | ICD-10-CM | POA: Diagnosis not present

## 2019-01-07 DIAGNOSIS — M25519 Pain in unspecified shoulder: Secondary | ICD-10-CM | POA: Diagnosis not present

## 2019-01-07 DIAGNOSIS — I10 Essential (primary) hypertension: Secondary | ICD-10-CM | POA: Diagnosis not present

## 2019-01-10 ENCOUNTER — Telehealth: Payer: Self-pay | Admitting: Internal Medicine

## 2019-01-10 NOTE — Telephone Encounter (Signed)
LVM to ask Pt why she drop up SSA papers to the office since I never saw for financial

## 2019-03-07 DIAGNOSIS — Z79899 Other long term (current) drug therapy: Secondary | ICD-10-CM | POA: Diagnosis not present

## 2019-03-07 DIAGNOSIS — M25461 Effusion, right knee: Secondary | ICD-10-CM | POA: Diagnosis not present

## 2019-03-07 DIAGNOSIS — E785 Hyperlipidemia, unspecified: Secondary | ICD-10-CM | POA: Diagnosis not present

## 2019-03-07 DIAGNOSIS — I10 Essential (primary) hypertension: Secondary | ICD-10-CM | POA: Diagnosis not present

## 2019-03-07 DIAGNOSIS — M1990 Unspecified osteoarthritis, unspecified site: Secondary | ICD-10-CM | POA: Diagnosis not present

## 2019-03-07 DIAGNOSIS — E119 Type 2 diabetes mellitus without complications: Secondary | ICD-10-CM | POA: Diagnosis not present

## 2019-03-07 DIAGNOSIS — M25561 Pain in right knee: Secondary | ICD-10-CM | POA: Diagnosis not present

## 2019-03-07 DIAGNOSIS — M069 Rheumatoid arthritis, unspecified: Secondary | ICD-10-CM | POA: Diagnosis not present

## 2019-03-08 ENCOUNTER — Ambulatory Visit: Payer: Medicare Other | Admitting: Internal Medicine

## 2019-03-26 ENCOUNTER — Other Ambulatory Visit: Payer: Self-pay | Admitting: Internal Medicine

## 2019-03-26 DIAGNOSIS — E119 Type 2 diabetes mellitus without complications: Secondary | ICD-10-CM

## 2019-03-26 DIAGNOSIS — E1165 Type 2 diabetes mellitus with hyperglycemia: Secondary | ICD-10-CM

## 2019-03-26 MED FILL — JANUMET 50-1,000 MG TABLET: 50-1000 | 30 days supply | Qty: 60 | Fill #1

## 2019-03-26 NOTE — Telephone Encounter (Signed)
Refill please.

## 2019-03-29 MED ORDER — ACCU-CHEK GUIDE W/DEVICE KIT: 1.0000 | PACK | Freq: Every day | 0 refills | Status: DC

## 2019-03-29 MED ORDER — SITAGLIPTIN PHOS-METFORMIN HCL 50-1000 MG PO TABS: 1.0000 | ORAL_TABLET | Freq: Two times a day (BID) | ORAL | 0 refills | Status: DC

## 2019-03-29 MED ORDER — GLUCOSE BLOOD VI STRP: ORAL_STRIP | 12 refills | Status: DC

## 2019-03-29 MED ORDER — ACCU-CHEK FASTCLIX LANCETS MISC: 12 refills | Status: DC

## 2019-05-14 ENCOUNTER — Other Ambulatory Visit: Payer: Self-pay

## 2019-05-14 ENCOUNTER — Ambulatory Visit: Payer: Medicare Other | Attending: Internal Medicine | Admitting: Internal Medicine

## 2019-05-16 ENCOUNTER — Ambulatory Visit: Payer: Medicare Other | Admitting: Primary Care

## 2019-05-16 ENCOUNTER — Other Ambulatory Visit: Payer: Self-pay

## 2019-05-16 ENCOUNTER — Ambulatory Visit: Payer: Medicare Other | Attending: Primary Care | Admitting: Physician Assistant

## 2019-05-16 NOTE — Progress Notes (Signed)
Virtual Visit via Telephone Note  I connected with Natalie Cherry on 05/16/19 at  2:10 PM EDT by telephone and verified that I am speaking with the correct person using two identifiers.   I discussed the limitations, risks, security and privacy concerns of performing an evaluation and management service by telephone and the availability of in person appointments. I also discussed with the patient that there may be a patient responsible charge related to this service. The patient expressed understanding and agreed to proceed.  Patient location: My Location:  [place] office Persons on the call:   History of Present Illness:    Observations/Objective:   Assessment and Plan:   Follow Up Instructions:    I discussed the assessment and treatment plan with the patient. The patient was provided an opportunity to ask questions and all were answered. The patient agreed with the plan and demonstrated an understanding of the instructions.   The patient was advised to call back or seek an in-person evaluation if the symptoms worsen or if the condition fails to improve as anticipated.  I provided *** minutes of non-face-to-face time during this encounter.   Freeman Caldron, PA-C  Patient ID: Natalie Cherry, female   DOB: November 04, 1961, 58 y.o.   MRN: [id number]

## 2019-05-22 ENCOUNTER — Telehealth: Payer: Medicare Other | Admitting: Internal Medicine

## 2019-05-23 ENCOUNTER — Telehealth (HOSPITAL_BASED_OUTPATIENT_CLINIC_OR_DEPARTMENT_OTHER): Payer: Medicare Other | Admitting: Family Medicine

## 2019-05-23 ENCOUNTER — Encounter: Payer: Self-pay | Admitting: Family Medicine

## 2019-05-23 DIAGNOSIS — M545 Low back pain, unspecified: Secondary | ICD-10-CM

## 2019-05-23 DIAGNOSIS — M25551 Pain in right hip: Secondary | ICD-10-CM | POA: Diagnosis not present

## 2019-05-23 DIAGNOSIS — W19XXXA Unspecified fall, initial encounter: Secondary | ICD-10-CM

## 2019-05-23 DIAGNOSIS — R200 Anesthesia of skin: Secondary | ICD-10-CM | POA: Diagnosis not present

## 2019-05-23 DIAGNOSIS — E1165 Type 2 diabetes mellitus with hyperglycemia: Secondary | ICD-10-CM

## 2019-05-23 DIAGNOSIS — M62838 Other muscle spasm: Secondary | ICD-10-CM | POA: Diagnosis not present

## 2019-05-23 DIAGNOSIS — R202 Paresthesia of skin: Secondary | ICD-10-CM

## 2019-05-23 DIAGNOSIS — M25552 Pain in left hip: Secondary | ICD-10-CM

## 2019-05-23 MED ORDER — PREDNISONE 10 MG PO TABS: ORAL_TABLET | ORAL | 0 refills | Status: DC

## 2019-05-23 MED ORDER — METHOCARBAMOL 500 MG PO TABS: 500.0000 mg | ORAL_TABLET | Freq: Three times a day (TID) | ORAL | 0 refills | Status: DC | PRN

## 2019-05-23 MED ORDER — NYSTATIN 100000 UNIT/GM EX CREA: 1.0000 "application " | TOPICAL_CREAM | Freq: Three times a day (TID) | CUTANEOUS | 3 refills | Status: DC

## 2019-05-23 MED ORDER — JANUMET 50-1000 MG PO TABS: 1.0000 | ORAL_TABLET | Freq: Two times a day (BID) | ORAL | 0 refills | Status: DC

## 2019-05-23 MED ORDER — TRAMADOL HCL 50 MG PO TABS: 50.0000 mg | ORAL_TABLET | Freq: Four times a day (QID) | ORAL | 0 refills | Status: AC | PRN

## 2019-05-23 NOTE — Progress Notes (Signed)
Virtual Visit via Video Note  I connected with Natalie Cherry on 05/23/19 at  2:50 PM EDT by a video enabled telemedicine application and verified that I am speaking with the correct person using two identifiers.  Location: Patient: Home with her granddaughter who acted as an interpreter for the patient Provider: Office   I discussed the limitations of evaluation and management by telemedicine and the availability of in person appointments. The patient expressed understanding and agreed to proceed.  History of Present Illness:      58 yo diabetic female who reports that about 3 weeks ago she tripped while walking outside and landed on her buttocks and since that time has had pain in her lower back and hips but then about a week later developed pain that occurs at the back of her scalp and goes down her shoulders. Upon questioning she also has some numbness in both hands which occurs overnight.  Back pain and hip pain are worse at night when she is attempting to lie down to sleep.  Patient has some mild difficulty when she gets up to start walking after sleeping overnight.  She has increased pain and stiffness in her back and hips at that time.  Current pain level is about a 8 on a 0-to-10 scale in her back, hips and pain is about a 6 on the right side of her neck down to the shoulder area.  Patient also has difficulty lifting her left arm to the shoulder area but this started prior to her recent fall but is now more painful when attempting to lift her arm.      On review of systems, she denies any chest pain or palpitations, no headaches or dizziness, no shortness of breath or cough.  She denies any joint swelling or increased warmth to any joints.  No hypoglycemia or syncopal episodes.  No dysuria.  She does have occasional urinary frequency.  She also request something to help with vaginal itching.  She will also need refill of Janumet for continued treatment of diabetes.  Past Medical History:   Diagnosis Date  . Arthritis   . Diabetes mellitus   . Hypertension    Family History  Problem Relation Age of Onset  . Diabetes Mother    Social History   Tobacco Use  . Smoking status: Never Smoker  . Smokeless tobacco: Never Used  Substance Use Topics  . Alcohol use: No  . Drug use: No   No Known Allergies   Current Outpatient Medications on File Prior to Visit  Medication Sig Dispense Refill  . Accu-Chek FastClix Lancets MISC Use as instructed to test blood sugar once daily 102 each 12  . Adalimumab 40 MG/0.8ML PNKT Inject into the skin.    Marland Kitchen atorvastatin (LIPITOR) 20 MG tablet Take 1 tablet (20 mg total) by mouth daily. 30 tablet 11  . Blood Glucose Monitoring Suppl (ACCU-CHEK GUIDE) w/Device KIT 1 each by Does not apply route daily. Use as instructed to test blood sugar once daily 1 kit 0  . cetirizine (ZYRTEC) 10 MG tablet Take 1 tablet (10 mg total) by mouth daily. (Patient not taking: Reported on 12/07/2018) 30 tablet 11  . denosumab (PROLIA) 60 MG/ML SOSY injection Inject 60 mg into the skin every 6 (six) months.    . fluticasone (FLONASE) 50 MCG/ACT nasal spray Place 2 sprays into both nostrils daily. (Patient not taking: Reported on 01/09/2481) 16 g 1  . folic acid (FOLVITE) 1 MG tablet Take 1  tablet (1 mg total) by mouth daily. 90 tablet 2  . glimepiride (AMARYL) 2 MG tablet Take 1 tablet (2 mg total) by mouth daily before breakfast. 30 tablet 3  . glucose blood (ACCU-CHEK GUIDE) test strip Use as instructed to test blood sugar once daily 100 each 12  . Insulin Pen Needle (PEN NEEDLES) 31G X 5 MM MISC 1 Dose by Does not apply route daily. (Patient not taking: Reported on 08/30/2018) 100 each 0  . Methotrexate, PF, 25 MG/0.4ML SOAJ Inject 0.7 mL [place] one day a week    . naproxen (NAPROSYN) 500 MG tablet TAKE 1 TABLET BY MOUTH 2 TIMES DAILY WITH A MEAL prn pain 60 tablet 0   No current facility-administered medications on file prior to visit.       Observations/Objective: Patient was seated during the video visit.  She did appear to be fatigued but did not appear acutely ill.  She did have some restriction in her ability to turn her neck especially when looking to the right.  Patient had inability to lift the left arm to horizontal and complained of left shoulder pain when attempting to lift the left arm.  She did not appear to have any increase in work of breathing and appeared to have normal respiratory rate.  Assessment and Plan: 1. Acute bilateral low back pain without sciatica;2.  Acute hip pain bilateral Patient reports recent fall after tripping while walking outside of her home in her yard.  Patient now with pain in her lower back and bilateral hips.  She denies any radiation of pain.  She also has some neck and bilateral shoulder discomfort as well as muscle spasm in the neck and upper back.  Prescription provided for Robaxin 500 mg to take 1 tablet every 8 hours as needed for muscle spasm.  Prescription provided for tramadol 50 mg to take 1 pill every 6-8 hours as needed for moderate to severe pain.  Orthopedic referral placed but if patient has not seen orthopedics by mid week then she should also obtain lumbar spine films and hip films and follow-up in office for further evaluation if she has any acute worsening of pain or difficulty with ambulation she should go to the urgent care or ED for further evaluation - methocarbamol (ROBAXIN) 500 MG tablet; Take 1 tablet (500 mg total) by mouth every 8 (eight) hours as needed for muscle spasms.  Dispense: 60 tablet; Refill: 0 - traMADol (ULTRAM) 50 MG tablet; Take 1 tablet (50 mg total) by mouth every 6 (six) hours as needed for up to 5 days for moderate pain.  Dispense: 20 tablet; Refill: 0 - DG Lumbar Spine Complete; Future - DG HIPS BILAT WITH PELVIS 2V; Future  3. Muscle spasms of neck; 4.  Numbness and tingling in the hands Prescription provided for Robaxin to take 1 every 8 hours as  needed for muscle spasm.  She may also apply warm moist heat to the area of neck, upper back and shoulder discomfort.  She has been placed on a low-dose taper of prednisone to see if this helps with the numbness and tingling in the hands.  She will also obtain a cervical spine film.  Orthopedic referral placed to evaluate neck/shoulder/lower back and hip pain.  Patient made aware that the use of prednisone may increase her blood sugars as she is also diabetic. - methocarbamol (ROBAXIN) 500 MG tablet; Take 1 tablet (500 mg total) by mouth every 8 (eight) hours as needed for muscle spasms.  Dispense: 60 tablet; Refill: 0 - predniSONE (DELTASONE) 10 MG tablet; Take 2 pills daily x 2 days then 1 pill daily for 2 days then 1/2 pill daily for 4 days; take after a meal  Dispense: 8 tablet; Refill: 0 - DG Cervical Spine Complete; Future  5. Fall, initial encounter Pain medication, tramadol, prescribed medication for muscle spasm and inflammation, Robaxin and prednisone.  Referral placed orthopedics.  If she has not been seen by orthopedics by midweek she should have lumbar spine, cervical spine and hip x-rays performed and follow-up in clinic. - DG Lumbar Spine Complete; Future - DG Cervical Spine Complete; Future - DG HIPS BILAT WITH PELVIS 2V; Future  6. Uncontrolled type 2 diabetes mellitus with hyperglycemia (HCC) Patient is diabetic and requested refill of Janumet which was provided.  Patient also with complaint of vaginal itching which is most likely secondary to yeast infection and prescription provided for nystatin cream.  Patient was also made aware that the use of prednisone will likely increase her blood sugars and she should make sure that she is monitoring her sugars, drinking plenty of water and watching her diet. - sitaGLIPtin-metformin (JANUMET) 50-1000 MG tablet; Take 1 tablet by mouth 2 (two) times daily with a meal.  Dispense: 180 tablet; Refill: 0 - nystatin cream (MYCOSTATIN); Apply 1  application topically 3 (three) times daily. As needed for itchy rash  Dispense: 30 g; Refill: 3   Follow Up Instructions:Return in about 1 week (around 05/30/2019) for ED if worsening of symptoms or any concerns.    I discussed the assessment and treatment plan with the patient. The patient was provided an opportunity to ask questions and all were answered. The patient agreed with the plan and demonstrated an understanding of the instructions.   The patient was advised to call back or seek an in-person evaluation if the symptoms worsen or if the condition fails to improve as anticipated.  I provided 14  minutes of non-face-to-face time during this encounter.    , MD  

## 2019-05-24 ENCOUNTER — Ambulatory Visit (HOSPITAL_COMMUNITY)
Admission: RE | Admit: 2019-05-24 | Discharge: 2019-05-24 | Disposition: A | Discharge status: Home / Self Care | Payer: Medicare Other | Source: Ambulatory Visit | Attending: Family Medicine | Admitting: Family Medicine

## 2019-05-24 ENCOUNTER — Other Ambulatory Visit: Payer: Self-pay

## 2019-05-24 DIAGNOSIS — W19XXXA Unspecified fall, initial encounter: Secondary | ICD-10-CM | POA: Diagnosis not present

## 2019-05-24 DIAGNOSIS — M47812 Spondylosis without myelopathy or radiculopathy, cervical region: Secondary | ICD-10-CM | POA: Insufficient documentation

## 2019-05-24 DIAGNOSIS — M545 Low back pain, unspecified: Secondary | ICD-10-CM

## 2019-05-24 DIAGNOSIS — R200 Anesthesia of skin: Secondary | ICD-10-CM | POA: Diagnosis not present

## 2019-05-24 DIAGNOSIS — S199XXA Unspecified injury of neck, initial encounter: Secondary | ICD-10-CM | POA: Diagnosis not present

## 2019-05-24 DIAGNOSIS — S79911A Unspecified injury of right hip, initial encounter: Secondary | ICD-10-CM | POA: Diagnosis not present

## 2019-05-24 DIAGNOSIS — S79912A Unspecified injury of left hip, initial encounter: Secondary | ICD-10-CM | POA: Diagnosis not present

## 2019-05-24 DIAGNOSIS — M62838 Other muscle spasm: Secondary | ICD-10-CM | POA: Insufficient documentation

## 2019-05-24 DIAGNOSIS — R202 Paresthesia of skin: Secondary | ICD-10-CM | POA: Insufficient documentation

## 2019-05-24 DIAGNOSIS — M25551 Pain in right hip: Secondary | ICD-10-CM | POA: Diagnosis not present

## 2019-05-24 DIAGNOSIS — M25552 Pain in left hip: Secondary | ICD-10-CM | POA: Diagnosis not present

## 2019-05-27 ENCOUNTER — Ambulatory Visit (INDEPENDENT_AMBULATORY_CARE_PROVIDER_SITE_OTHER): Payer: Medicare Other | Admitting: Family Medicine

## 2019-05-27 ENCOUNTER — Encounter: Payer: Self-pay | Admitting: Family Medicine

## 2019-05-27 ENCOUNTER — Other Ambulatory Visit: Payer: Self-pay

## 2019-05-27 DIAGNOSIS — M545 Low back pain, unspecified: Secondary | ICD-10-CM

## 2019-05-27 DIAGNOSIS — M542 Cervicalgia: Secondary | ICD-10-CM

## 2019-05-27 MED ORDER — NABUMETONE 750 MG PO TABS: 750.0000 mg | ORAL_TABLET | Freq: Two times a day (BID) | ORAL | 6 refills | Status: DC | PRN

## 2019-05-27 MED ORDER — TIZANIDINE HCL 2 MG PO TABS: 2.0000 mg | ORAL_TABLET | Freq: Four times a day (QID) | ORAL | 1 refills | Status: DC | PRN

## 2019-05-27 MED FILL — tiZANidine HCL 2 MG TABS: 2 | 7 days supply | Qty: 60 | Fill #0

## 2019-05-27 MED FILL — NABUMETONE 750 MG TABLET: 750 | 30 days supply | Qty: 60 | Fill #0

## 2019-05-27 NOTE — Progress Notes (Signed)
Office Visit Note   Patient: Natalie Cherry           Date of Birth: 1961/02/21           MRN: [id number] Visit Date: 05/27/2019 Requested by: Cain Saupe, MD [address] [place],  [place] [address] PCP: Marcine Matar, MD  Subjective: Chief Complaint  Patient presents with  . Lower Back - Pain    Pain across lower back, around to the hips. Fell 1 month ago.  . Neck - Pain    Right-sided neck pain x 1 month, post fall. Numbness & tingling in both hands.    HPI: She is here with neck and low back pain.  She has an interpreter with her today.  About 4 weeks ago she lost her balance and fell landing on her buttocks.  She has had pain in her neck and back ever since then.  Using ibuprofen for pain with minimal improvement.  Her neck hurts to turn her head in any direction, no radicular symptoms.  Lower back hurts bilaterally in the lumbosacral area with radiation into the posterior hips.  No previous problems with her neck or low back.  She had x-rays done last week which I reviewed on computer showing C5-6 and C6-7 degenerative disc disease with facet arthritis, and lumbar degenerative changes as well but no acute fractures.               ROS: Denies fevers or chills, denies bowel or bladder dysfunction.  All other systems were reviewed and are negative.  Objective: Vital Signs: There were no vitals taken for this visit.  Physical Exam:  General:  Alert and oriented, in no acute distress. Pulm:  Breathing unlabored. Psy:  Normal mood, congruent affect. Skin: No rash on the skin. Neck: Symmetrically decreased rotation range of motion, negative Spurling's test.  Upper extremity strength and reflexes are normal.  Very tender near the C7 spinous process and in the paraspinous muscles. Low back: Tender around the L5-S1 level and near the SI joints.  Tender over both sciatic notch areas.  Negative straight leg raise, lower extremity strength and reflexes are still normal.   Imaging: None today.  Assessment & Plan: 1.  1 month status post fall with neck and lower back pain, probably sprain/strain injury versus aggravation of underlying facet arthropathy. -We will try physical therapy as well as a different anti-inflammatory and a muscle relaxant.  If she fails to improve she will contact me and I will order MRI scan of neck and/or lumbar spine.     Procedures: No procedures performed  No notes on file     PMFS History: Patient Active Problem List   Diagnosis Date Noted  . Osteoporosis without current pathological fracture 12/07/2018  . Hyperlipidemia 07/17/2018  . Type 2 diabetes mellitus with hyperglycemia, without long-term current use of insulin (HCC) 11/06/2015  . Rheumatoid arthritis (HCC) 01/29/2014  . Vitamin D deficiency 01/29/2014  . DENTAL CARIES 03/02/2009  . Osteoarthritis of spine 10/05/2007   Past Medical History:  Diagnosis Date  . Arthritis   . Diabetes mellitus   . Hypertension     Family History  Problem Relation Age of Onset  . Diabetes Mother     History reviewed. No pertinent surgical history. Social History   Occupational History  . Not on file  Tobacco Use  . Smoking status: Never Smoker  . Smokeless tobacco: Never Used  Substance and Sexual Activity  . Alcohol use: No  .  Drug use: No  . Sexual activity: Yes

## 2019-05-28 ENCOUNTER — Telehealth: Payer: Self-pay | Admitting: Emergency Medicine

## 2019-05-28 ENCOUNTER — Encounter: Payer: Self-pay | Admitting: Internal Medicine

## 2019-05-28 ENCOUNTER — Ambulatory Visit: Payer: Medicare Other | Attending: Internal Medicine | Admitting: Internal Medicine

## 2019-05-28 DIAGNOSIS — M069 Rheumatoid arthritis, unspecified: Secondary | ICD-10-CM | POA: Diagnosis not present

## 2019-05-28 DIAGNOSIS — M542 Cervicalgia: Secondary | ICD-10-CM

## 2019-05-28 DIAGNOSIS — M545 Low back pain, unspecified: Secondary | ICD-10-CM

## 2019-05-28 DIAGNOSIS — E785 Hyperlipidemia, unspecified: Secondary | ICD-10-CM | POA: Diagnosis not present

## 2019-05-28 DIAGNOSIS — E119 Type 2 diabetes mellitus without complications: Secondary | ICD-10-CM

## 2019-05-28 MED ORDER — ATORVASTATIN CALCIUM 20 MG PO TABS: 20.0000 mg | ORAL_TABLET | Freq: Every day | ORAL | 11 refills | Status: DC

## 2019-05-28 MED FILL — ATORVASTATIN 20 MG TABLET: 20 | 30 days supply | Qty: 30 | Fill #0

## 2019-05-28 NOTE — Telephone Encounter (Signed)
Patient contacted via phone to be given results of labs.  Patient identified by name and date of birth.  Patient given results of labs.  Patient educated on lab results. Questions answered. Patient acknowledged understanding of labs results. 

## 2019-05-28 NOTE — Progress Notes (Signed)
Virtual Visit via Telephone Note Due to current restrictions/limitations of in-office visits due to the COVID-19 pandemic, this scheduled clinical appointment was converted to a telehealth visit  I connected with Natalie Cherry on 05/28/19 at 2:08 p.m EDT by telephone and verified that I am speaking with the correct person using two identifiers. I am in my office.  The patient is at home.  Only the patient, myself and Verdis Frederickson from [employer] [phone] ) participated in this encounter.  I discussed the limitations, risks, security and privacy concerns of performing an evaluation and management service by telephone and the availability of in person appointments. I also discussed with the patient that there may be a patient responsible charge related to this service. The patient expressed understanding and agreed to proceed.  History of Present Illness: Pt with hx of DM, HL, RA, osteoporosis on Prolia  Patient had telehealth visit with Dr. Chapman Fitch last week for acute pain in her hips, lower back and neck post fall about 3 weeks ago.  X-rays of the hip were negative for any acute fracture.  X-rays of the lower back and neck revealed degenerative changes.  Patient was prescribed prednisone, Robaxin and tramadol. Saw ortho Dr. Junius Roads yesterday for the RT sided neck  and LBP.  Diagnosed with probable sprain/strain injury versus aggravation of underlying facet arthropathy. Referred to P.T.  Prescribed Relafen and Zanaflex.  I verified with her to make sure she is not taking Naprosyn and Robaxin  DIABETES TYPE 2 Last A1C:   Results for orders placed or performed in visit on 12/07/18  POCT glucose (manual entry)  Result Value Ref Range   POC Glucose 253 (A) 70 - 99 mg/dl  POCT glycosylated hemoglobin (Hb A1C)  Result Value Ref Range   Hemoglobin A1C     HbA1c POC (<> result, manual entry)     HbA1c, POC (prediabetic range)     HbA1c, POC (controlled diabetic range) 8.1 (A) 0.0 - 7.0 %    Med  Adherence:  '[x]'  Yes    '[]'  No Medication side effects:  '[]'  Yes    '[x]'  No Home Monitoring?  '[x]'  Yes every 3 days  Home glucose results range:  117-140 Diet Adherence: '[x]'  Yes - eating less red meat, rice.  Eating more fruits and veggies.  Drinks water rather than sodas    Exercise: '[x]'  Yes -can not walk for more than 10-15 mins due to knee pain from arthritis.  She walks for 15 mins daily   Hypoglycemic episodes?: '[]'  Yes    '[x]'  No Numbness of the feet? '[]'  Yes    '[x]'  No Retinopathy hx? '[]'  Yes    '[]'  No Last eye exam:  Did not have eye exam done Comments:   HL:  Out of Lipitor x 2 mths.  Requests refills  RA:  Saw Dr. Dossie Der 2 mths ago  Current Outpatient Medications on File Prior to Visit  Medication Sig Dispense Refill  . Accu-Chek FastClix Lancets MISC Use as instructed to test blood sugar once daily 102 each 12  . Adalimumab 40 MG/0.8ML PNKT Inject into the skin.    Marland Kitchen atorvastatin (LIPITOR) 20 MG tablet Take 1 tablet (20 mg total) by mouth daily. 30 tablet 11  . Blood Glucose Monitoring Suppl (ACCU-CHEK GUIDE) w/Device KIT 1 each by Does not apply route daily. Use as instructed to test blood sugar once daily 1 kit 0  . folic acid (FOLVITE) 1 MG tablet Take 1 tablet (1 mg total) by mouth  daily. 90 tablet 2  . glimepiride (AMARYL) 2 MG tablet Take 1 tablet (2 mg total) by mouth daily before breakfast. 30 tablet 3  . glucose blood (ACCU-CHEK GUIDE) test strip Use as instructed to test blood sugar once daily 100 each 12  . hydroxychloroquine (PLAQUENIL) 200 MG tablet Take by mouth.    . methocarbamol (ROBAXIN) 500 MG tablet Take 1 tablet (500 mg total) by mouth every 8 (eight) hours as needed for muscle spasms. 60 tablet 0  . Methotrexate, PF, 25 MG/0.4ML SOAJ Inject 0.7 mL [place] one day a week    . nabumetone (RELAFEN) 750 MG tablet Take 1 tablet (750 mg total) by mouth 2 (two) times daily as needed. 60 tablet 6  . naproxen (NAPROSYN) 500 MG tablet TAKE 1 TABLET BY MOUTH 2 TIMES DAILY WITH A MEAL  prn pain 60 tablet 0  . nystatin cream (MYCOSTATIN) Apply 1 application topically 3 (three) times daily. As needed for itchy rash 30 g 3  . predniSONE (DELTASONE) 10 MG tablet Take 2 pills daily x 2 days then 1 pill daily for 2 days then 1/2 pill daily for 4 days; take after a meal 8 tablet 0  . sitaGLIPtin-metformin (JANUMET) 50-1000 MG tablet Take 1 tablet by mouth 2 (two) times daily with a meal. 180 tablet 0  . tiZANidine (ZANAFLEX) 2 MG tablet Take 1-2 tablets (2-4 mg total) by mouth every 6 (six) hours as needed for muscle spasms. 60 tablet 1  . traMADol (ULTRAM) 50 MG tablet Take 1 tablet (50 mg total) by mouth every 6 (six) hours as needed for up to 5 days for moderate pain. 20 tablet 0  . cetirizine (ZYRTEC) 10 MG tablet Take 1 tablet (10 mg total) by mouth daily. (Patient not taking: Reported on 12/07/2018) 30 tablet 11  . denosumab (PROLIA) 60 MG/ML SOSY injection Inject 60 mg into the skin every 6 (six) months.    . fluticasone (FLONASE) 50 MCG/ACT nasal spray Place 2 sprays into both nostrils daily. (Patient not taking: Reported on 12/07/2018) 16 g 1  . Insulin Pen Needle (PEN NEEDLES) 31G X 5 MM MISC 1 Dose by Does not apply route daily. (Patient not taking: Reported on 08/30/2018) 100 each 0   No current facility-administered medications on file prior to visit.     Observations/Objective: No direct observation done  Assessment and Plan: 1. Type 2 diabetes mellitus without complication, without long-term current use of insulin ([place]) -Encouraged her to continue healthy eating habits and regular exercise as tolerated.  Continue Amaryl and Janumet.  Continue to monitor blood sugars.  Referral given for eye exam. - CBC; Future - Comprehensive metabolic panel; Future - Lipid panel; Future - Hemoglobin A1c; Future - Ambulatory referral to Ophthalmology  2. Hyperlipidemia, unspecified hyperlipidemia type - atorvastatin (LIPITOR) 20 MG tablet; Take 1 tablet (20 mg total) by mouth daily.   Dispense: 30 tablet; Refill: 11  3. Rheumatoid arthritis involving multiple sites, unspecified rheumatoid factor presence ([place]) Followed by rheumatology  4. Acute low back pain, unspecified back pain laterality, unspecified whether sciatica present 5. Neck pain, acute Advised patient that if she did not get prescriptions filled from the orthopedics, she should discontinue taking methocarbamol.  I spelled out this medication for her so that she can look at her bottles   Follow Up Instructions: F/u in 3 mths   I discussed the assessment and treatment plan with the patient. The patient was provided an opportunity to ask questions and all were  answered. The patient agreed with the plan and demonstrated an understanding of the instructions.   The patient was advised to call back or seek an in-person evaluation if the symptoms worsen or if the condition fails to improve as anticipated.  I provided 22 minutes of non-face-to-face time during this encounter.   Karle Plumber, MD

## 2019-05-28 NOTE — Progress Notes (Signed)
Patient verified DOB Patient complains of pain in back and neck and scaled at an 8. Patient has taken medication today. Patient has eaten today. Patient denies any URI symptoms.

## 2019-05-29 ENCOUNTER — Ambulatory Visit: Payer: Medicare Other | Attending: Internal Medicine

## 2019-05-29 ENCOUNTER — Other Ambulatory Visit: Payer: Self-pay

## 2019-05-29 DIAGNOSIS — E119 Type 2 diabetes mellitus without complications: Secondary | ICD-10-CM | POA: Diagnosis not present

## 2019-05-30 LAB — LIPID PANEL
Chol/HDL Ratio: 5.2 ratio — ABNORMAL HIGH (ref 0.0–4.4)
Cholesterol, Total: 207 mg/dL — ABNORMAL HIGH (ref 100–199)
HDL: 40 mg/dL (ref >39)
LDL Calculated: 120 mg/dL — ABNORMAL HIGH (ref 0–99)
Triglycerides: 237 mg/dL — ABNORMAL HIGH (ref 0–149)
VLDL Cholesterol Cal: 47 mg/dL — ABNORMAL HIGH (ref 5–40)

## 2019-05-30 LAB — COMPREHENSIVE METABOLIC PANEL
ALT: 30 IU/L (ref 0–32)
AST: 19 IU/L (ref 0–40)
Albumin/Globulin Ratio: 1.1 — ABNORMAL LOW (ref 1.2–2.2)
Albumin: 4.1 g/dL (ref 3.8–4.9)
Alkaline Phosphatase: 122 IU/L — ABNORMAL HIGH (ref 39–117)
BUN/Creatinine Ratio: 19 (ref 9–23)
BUN: 15 mg/dL (ref 6–24)
Bilirubin Total: <0.2 mg/dL (ref 0.0–1.2)
CO2: 20 mmol/L (ref 20–29)
Calcium: 9.5 mg/dL (ref 8.7–10.2)
Chloride: 99 mmol/L (ref 96–106)
Creatinine, Ser: 0.78 mg/dL (ref 0.57–1.00)
GFR calc Af Amer: 97 mL/min/{1.73_m2} (ref >59)
GFR calc non Af Amer: 84 mL/min/{1.73_m2} (ref >59)
Globulin, Total: 3.6 g/dL (ref 1.5–4.5)
Glucose: 368 mg/dL — ABNORMAL HIGH (ref 65–99)
Potassium: 4.3 mmol/L (ref 3.5–5.2)
Sodium: 136 mmol/L (ref 134–144)
Total Protein: 7.7 g/dL (ref 6.0–8.5)

## 2019-05-30 LAB — CBC
Hematocrit: 41.8 % (ref 34.0–46.6)
Hemoglobin: 13.4 g/dL (ref 11.1–15.9)
MCH: 27.7 pg (ref 26.6–33.0)
MCHC: 32.1 g/dL (ref 31.5–35.7)
MCV: 86 fL (ref 79–97)
Platelets: 426 10*3/uL (ref 150–450)
RBC: 4.84 x10E6/uL (ref 3.77–5.28)
RDW: 13.2 % (ref 11.7–15.4)
WBC: 8.1 10*3/uL (ref 3.4–10.8)

## 2019-05-30 LAB — HEMOGLOBIN A1C
Est. average glucose Bld gHb Est-mCnc: 329 mg/dL
Hgb A1c MFr Bld: 13.1 % — ABNORMAL HIGH (ref 4.8–5.6)

## 2019-06-04 ENCOUNTER — Telehealth: Payer: Self-pay | Admitting: *Deleted

## 2019-06-04 NOTE — Telephone Encounter (Signed)
-----   Message from Ladell Pier, MD sent at 05/30/2019  8:09 AM EDT ----- Let patient know that her A1c was 13.  This is a 91-month level for the diabetes.  This means that her diabetes is out of control.  I would like to bring her in to discuss adding insulin or another diabetes medication..  Please schedule her to come in within the next 1 to 2 weeks.  Cholesterol is 120 with goal being less than 70.  Please take the atorvastatin as prescribed.  Kidney and liver function tests normal.  Blood count is normal meaning no anemia.

## 2019-06-04 NOTE — Telephone Encounter (Signed)
Medical Assistant used [employer] [employer] to contact patient.  Interpreter Name:Nayely Interpreter #: [id number] UTR patient of leave a message due to continuous ringing.

## 2019-06-06 ENCOUNTER — Telehealth: Payer: Self-pay | Admitting: Internal Medicine

## 2019-06-06 DIAGNOSIS — Z7984 Long term (current) use of oral hypoglycemic drugs: Secondary | ICD-10-CM | POA: Diagnosis not present

## 2019-06-06 DIAGNOSIS — Z20828 Contact with and (suspected) exposure to other viral communicable diseases: Secondary | ICD-10-CM | POA: Diagnosis not present

## 2019-06-06 DIAGNOSIS — R0600 Dyspnea, unspecified: Secondary | ICD-10-CM | POA: Diagnosis not present

## 2019-06-06 DIAGNOSIS — E119 Type 2 diabetes mellitus without complications: Secondary | ICD-10-CM | POA: Diagnosis not present

## 2019-06-06 DIAGNOSIS — R0602 Shortness of breath: Secondary | ICD-10-CM | POA: Diagnosis not present

## 2019-06-06 NOTE — Telephone Encounter (Signed)
Patient states she has had her test at the hospital.

## 2019-06-06 NOTE — Telephone Encounter (Signed)
Pt would like to be tested for Covid-19, states she had contact with her nephew who tested positive. Please follow up

## 2019-06-27 DIAGNOSIS — M25561 Pain in right knee: Secondary | ICD-10-CM | POA: Diagnosis not present

## 2019-06-27 DIAGNOSIS — M1990 Unspecified osteoarthritis, unspecified site: Secondary | ICD-10-CM | POA: Diagnosis not present

## 2019-06-27 DIAGNOSIS — M25461 Effusion, right knee: Secondary | ICD-10-CM | POA: Diagnosis not present

## 2019-06-27 DIAGNOSIS — M810 Age-related osteoporosis without current pathological fracture: Secondary | ICD-10-CM | POA: Diagnosis not present

## 2019-06-27 DIAGNOSIS — I10 Essential (primary) hypertension: Secondary | ICD-10-CM | POA: Diagnosis not present

## 2019-06-27 DIAGNOSIS — E785 Hyperlipidemia, unspecified: Secondary | ICD-10-CM | POA: Diagnosis not present

## 2019-06-27 DIAGNOSIS — M069 Rheumatoid arthritis, unspecified: Secondary | ICD-10-CM | POA: Diagnosis not present

## 2019-06-27 DIAGNOSIS — E119 Type 2 diabetes mellitus without complications: Secondary | ICD-10-CM | POA: Diagnosis not present

## 2019-06-27 DIAGNOSIS — Z79899 Other long term (current) drug therapy: Secondary | ICD-10-CM | POA: Diagnosis not present

## 2019-07-19 MED FILL — JANUMET 50-1,000 MG TABLET: 50-1000 | 90 days supply | Qty: 180 | Fill #2

## 2019-07-25 DIAGNOSIS — M25561 Pain in right knee: Secondary | ICD-10-CM | POA: Diagnosis not present

## 2019-07-25 DIAGNOSIS — I10 Essential (primary) hypertension: Secondary | ICD-10-CM | POA: Diagnosis not present

## 2019-07-25 DIAGNOSIS — M069 Rheumatoid arthritis, unspecified: Secondary | ICD-10-CM | POA: Diagnosis not present

## 2019-07-25 DIAGNOSIS — E785 Hyperlipidemia, unspecified: Secondary | ICD-10-CM | POA: Diagnosis not present

## 2019-07-25 DIAGNOSIS — E119 Type 2 diabetes mellitus without complications: Secondary | ICD-10-CM | POA: Diagnosis not present

## 2019-07-25 DIAGNOSIS — Z79899 Other long term (current) drug therapy: Secondary | ICD-10-CM | POA: Diagnosis not present

## 2019-07-25 DIAGNOSIS — M1990 Unspecified osteoarthritis, unspecified site: Secondary | ICD-10-CM | POA: Diagnosis not present

## 2019-07-25 DIAGNOSIS — M130 Polyarthritis, unspecified: Secondary | ICD-10-CM | POA: Diagnosis not present

## 2019-07-25 DIAGNOSIS — M25461 Effusion, right knee: Secondary | ICD-10-CM | POA: Diagnosis not present

## 2019-08-26 ENCOUNTER — Ambulatory Visit: Payer: Medicare Other | Admitting: Internal Medicine

## 2019-10-15 ENCOUNTER — Other Ambulatory Visit: Payer: Self-pay | Admitting: Internal Medicine

## 2019-10-15 DIAGNOSIS — E1165 Type 2 diabetes mellitus with hyperglycemia: Secondary | ICD-10-CM

## 2019-10-29 ENCOUNTER — Telehealth: Payer: Self-pay | Admitting: *Deleted

## 2019-10-29 NOTE — Telephone Encounter (Signed)
Natalie Cherry with [place] interpreter called patient for staff due to patient recent message wanting to schedule f/u appointment. Natalie Cherry unable to reach patient and [facility] for patient to call office back and office number was provided.

## 2019-11-06 ENCOUNTER — Ambulatory Visit: Payer: Medicare Other | Attending: Internal Medicine | Admitting: Physician Assistant

## 2019-11-06 DIAGNOSIS — E1165 Type 2 diabetes mellitus with hyperglycemia: Secondary | ICD-10-CM | POA: Diagnosis not present

## 2019-11-06 DIAGNOSIS — M62838 Other muscle spasm: Secondary | ICD-10-CM | POA: Diagnosis not present

## 2019-11-06 DIAGNOSIS — E785 Hyperlipidemia, unspecified: Secondary | ICD-10-CM | POA: Diagnosis not present

## 2019-11-06 MED ORDER — JANUMET 50-1000 MG PO TABS: 1.0000 | ORAL_TABLET | Freq: Two times a day (BID) | ORAL | 1 refills | Status: DC

## 2019-11-06 MED ORDER — TIZANIDINE HCL 2 MG PO TABS: 2.0000 mg | ORAL_TABLET | Freq: Four times a day (QID) | ORAL | 1 refills | Status: DC | PRN

## 2019-11-06 MED ORDER — GLIMEPIRIDE 2 MG PO TABS: 2.0000 mg | ORAL_TABLET | Freq: Every day | ORAL | 3 refills | Status: DC

## 2019-11-06 MED ORDER — ATORVASTATIN CALCIUM 20 MG PO TABS: 20.0000 mg | ORAL_TABLET | Freq: Every day | ORAL | 11 refills | Status: DC

## 2019-11-06 MED FILL — ATORVASTATIN CALCIUM 20 MG: 20 | 90 days supply | Qty: 90 | Fill #0

## 2019-11-06 MED FILL — GLIMEPIRIDE 2 MG TABS: 2 | 90 days supply | Qty: 90 | Fill #0

## 2019-11-06 MED FILL — JANUMET 50-1,000 MG TABLET: 50-1000 | 90 days supply | Qty: 180 | Fill #0

## 2019-11-06 MED FILL — tiZANidine HCL 2 MG TABS: 2 | 12 days supply | Qty: 60 | Fill #0

## 2019-11-06 NOTE — Progress Notes (Signed)
Virtual Visit via Telephone Note  I connected with Natalie Cherry on 11/06/19 at  2:10 PM EST by telephone and verified that I am speaking with the correct person using two identifiers.   I discussed the limitations, risks, security and privacy concerns of performing an evaluation and management service by telephone and the availability of in person appointments. I also discussed with the patient that there may be a patient responsible charge related to this service. The patient expressed understanding and agreed to proceed.  Patient location:  home My Location:  [place] office Persons on the call:  Me, the patient, and her daughter   Attempted to do mychart visit but their internet connection was unstable, so we had to switch to telephone visit.     History of Present Illness:  Needing RF on meds and refused to come into the office.  She says her blood sugars have been running ~110.  No hyper or hypoglycemia.  No concerns or complaints today.  She does agree to schedule a lab appt within the next month.      Observations/Objective:  NAD   Assessment and Plan: 1. Uncontrolled type 2 diabetes mellitus with hyperglycemia ([place]) Sounds controlled based on home readings - Hemoglobin A1c; Future - Comprehensive metabolic panel; Future - CBC with Differential; Future - glimepiride (AMARYL) 2 MG tablet; Take 1 tablet (2 mg total) by mouth daily before breakfast.  Dispense: 30 tablet; Refill: 3 - sitaGLIPtin-metformin (JANUMET) 50-1000 MG tablet; Take 1 tablet by mouth 2 (two) times daily with a meal.  Dispense: 180 tablet; Refill: 1  2. Hyperlipidemia, unspecified hyperlipidemia type - Lipid panel; Future - atorvastatin (LIPITOR) 20 MG tablet; Take 1 tablet (20 mg total) by mouth daily.  Dispense: 30 tablet; Refill: 11  3. Muscle spasm - tiZANidine (ZANAFLEX) 2 MG tablet; Take 1-2 tablets (2-4 mg total) by mouth every 6 (six) hours as needed for muscle spasms.  Dispense: 60 tablet; Refill:  1    Follow Up Instructions: See PCP in 3 months   I discussed the assessment and treatment plan with the patient. The patient was provided an opportunity to ask questions and all were answered. The patient agreed with the plan and demonstrated an understanding of the instructions.   The patient was advised to call back or seek an in-person evaluation if the symptoms worsen or if the condition fails to improve as anticipated.  I provided 9 minutes of non-face-to-face time during this encounter.   Freeman Caldron, PA-C  Patient ID: Natalie Cherry, female   DOB: 08-26-1961, 58 y.o.   MRN: [id number]

## 2019-11-06 NOTE — Progress Notes (Signed)
Pt. Is requesting refill for Janumet and Tizanidine.

## 2019-12-02 DIAGNOSIS — E119 Type 2 diabetes mellitus without complications: Secondary | ICD-10-CM | POA: Diagnosis not present

## 2019-12-02 DIAGNOSIS — I10 Essential (primary) hypertension: Secondary | ICD-10-CM | POA: Diagnosis not present

## 2019-12-02 DIAGNOSIS — Z79899 Other long term (current) drug therapy: Secondary | ICD-10-CM | POA: Diagnosis not present

## 2019-12-02 DIAGNOSIS — M25462 Effusion, left knee: Secondary | ICD-10-CM | POA: Diagnosis not present

## 2019-12-02 DIAGNOSIS — M069 Rheumatoid arthritis, unspecified: Secondary | ICD-10-CM | POA: Diagnosis not present

## 2019-12-02 DIAGNOSIS — M25569 Pain in unspecified knee: Secondary | ICD-10-CM | POA: Diagnosis not present

## 2019-12-02 DIAGNOSIS — E785 Hyperlipidemia, unspecified: Secondary | ICD-10-CM | POA: Diagnosis not present

## 2019-12-02 DIAGNOSIS — M1990 Unspecified osteoarthritis, unspecified site: Secondary | ICD-10-CM | POA: Diagnosis not present

## 2019-12-12 IMAGING — CR DG HIP (WITH OR WITHOUT PELVIS) 2V BILAT
5 series · 5 of 5 positions shown · non-contrast
Comparison: None.

CLINICAL DATA: Pain status post fall

EXAM:
DG HIP (WITH OR WITHOUT PELVIS) 2V BILAT

[pelvis ap]
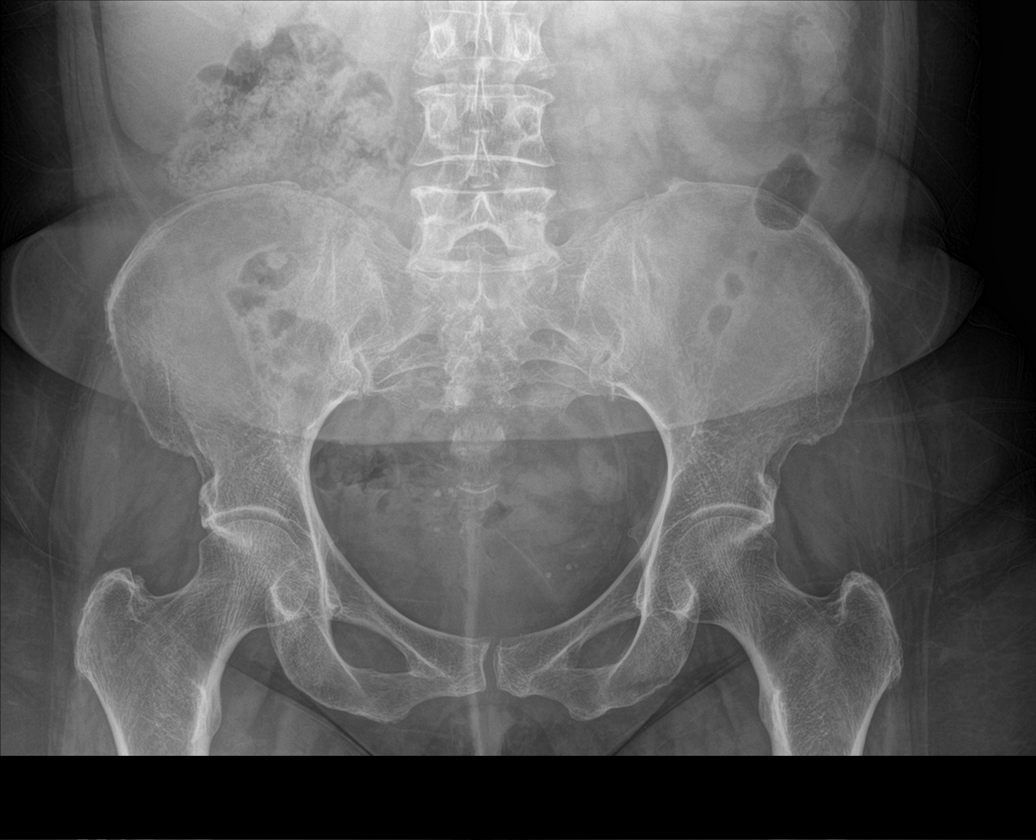

[hip ap (1 of 2)]
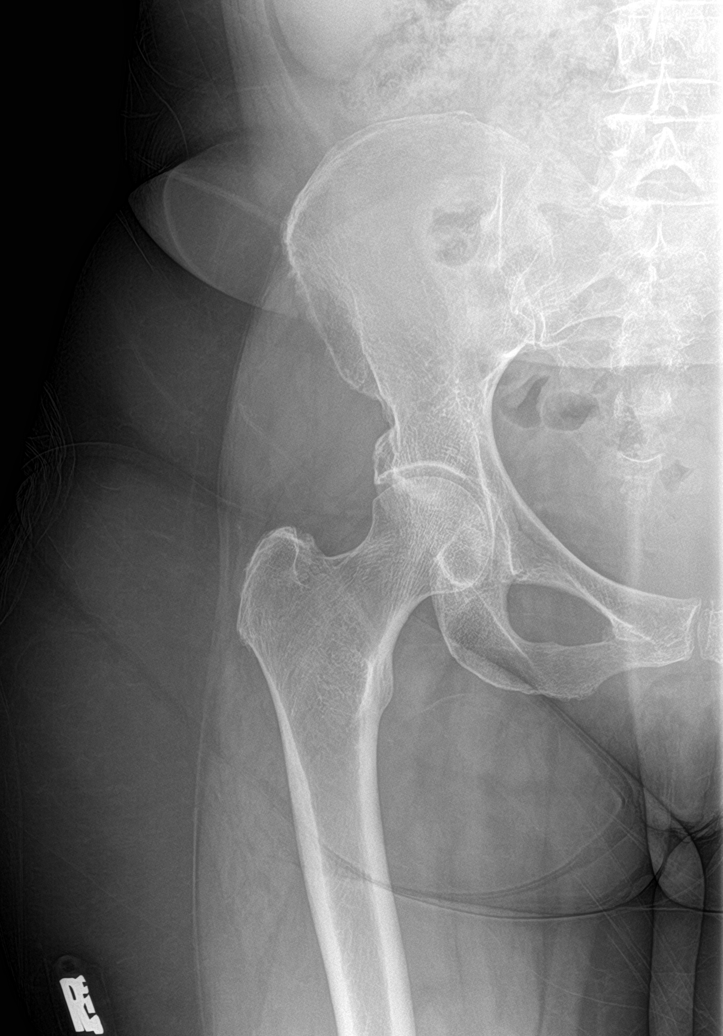

[hip lat (1 of 2)]
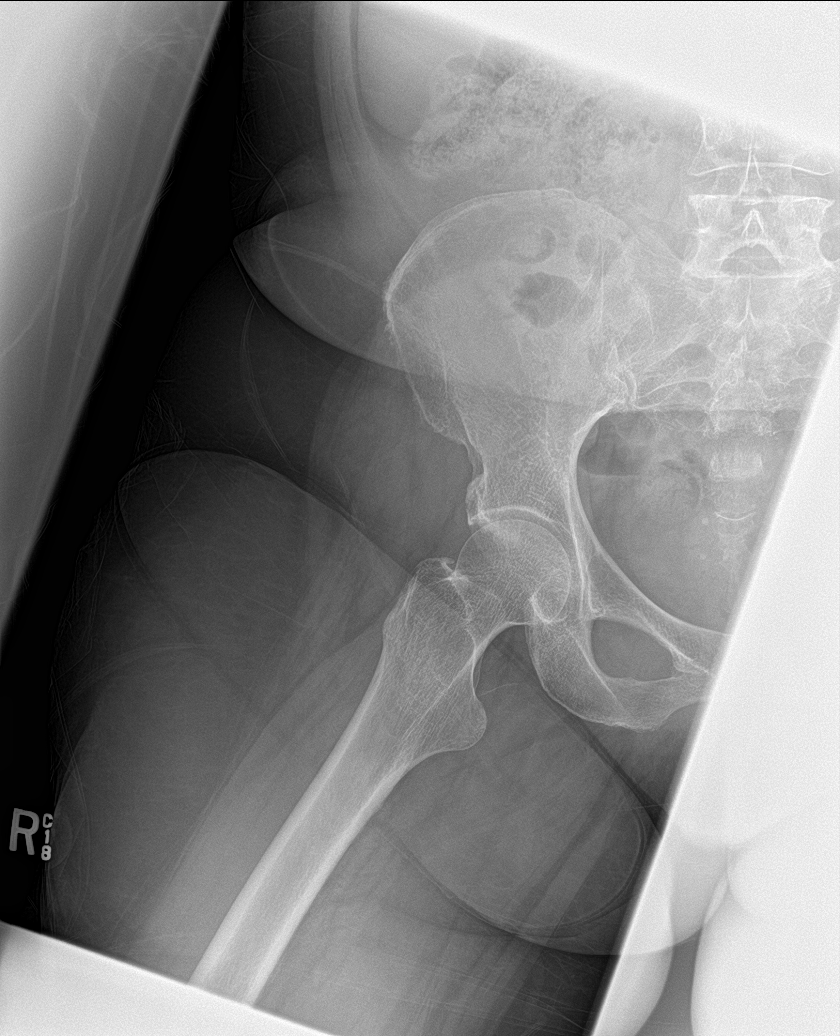

[hip ap (2 of 2)]
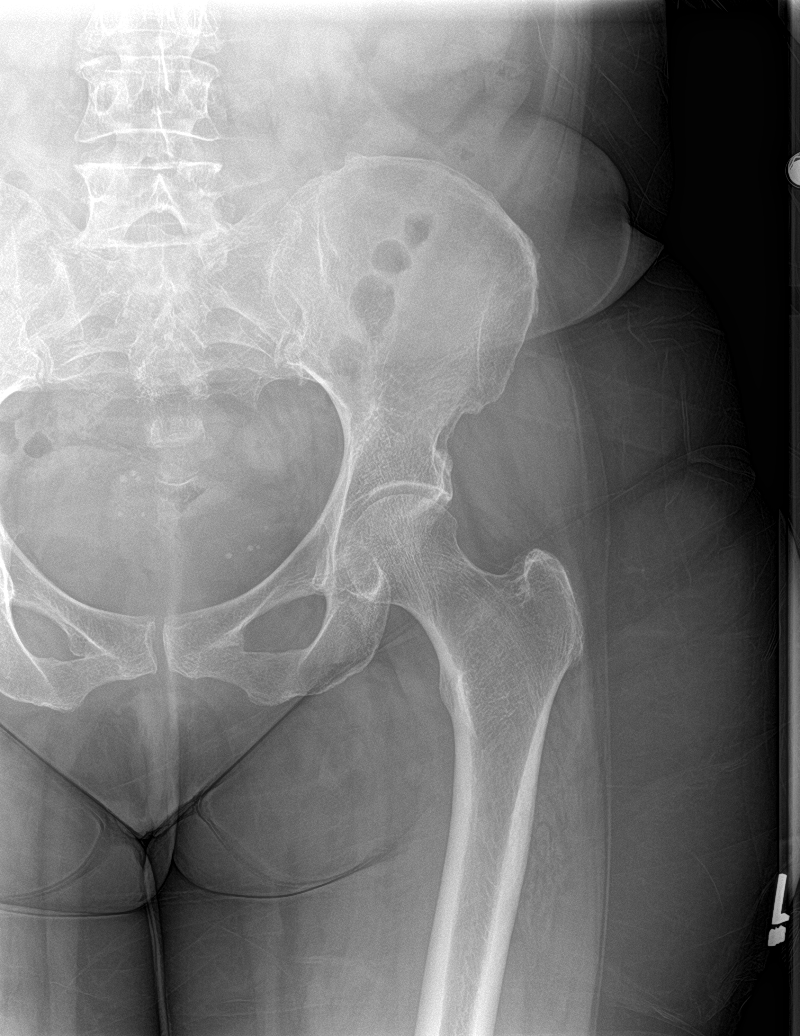

[hip lat (2 of 2)]
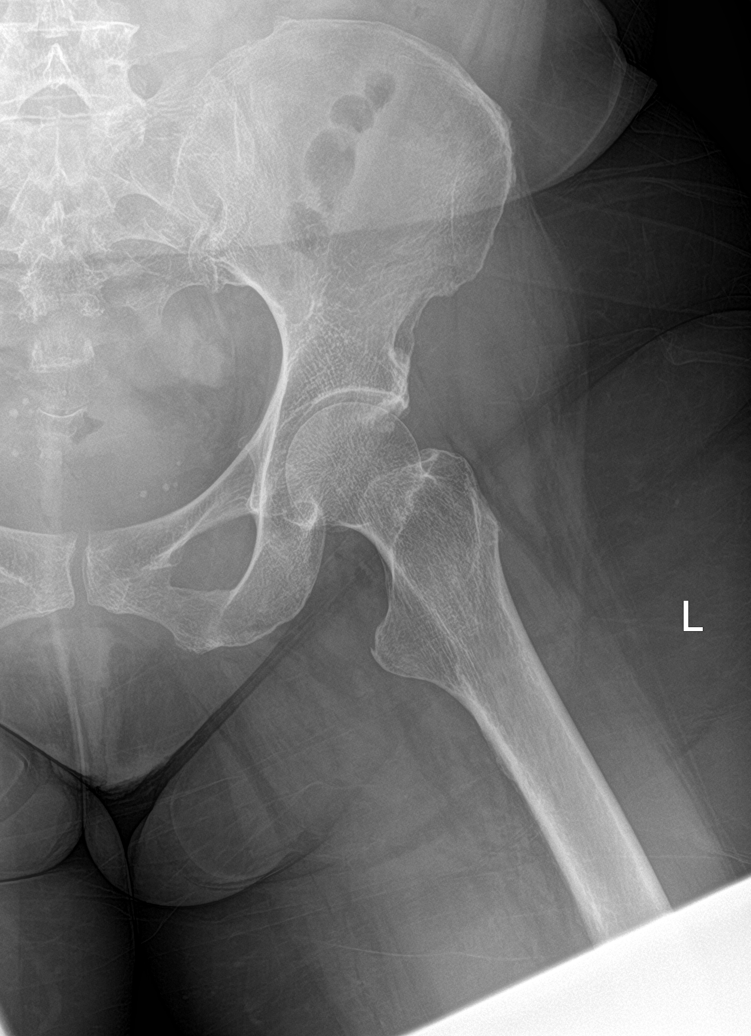

[5 of 5 positions shown; findings below may reference images not displayed]

FINDINGS: There is no evidence of hip fracture or dislocation. There is no
evidence of arthropathy or other focal bone abnormality.
IMPRESSION: Negative.

## 2020-01-02 DIAGNOSIS — E119 Type 2 diabetes mellitus without complications: Secondary | ICD-10-CM | POA: Diagnosis not present

## 2020-01-02 DIAGNOSIS — M25569 Pain in unspecified knee: Secondary | ICD-10-CM | POA: Diagnosis not present

## 2020-01-02 DIAGNOSIS — M810 Age-related osteoporosis without current pathological fracture: Secondary | ICD-10-CM | POA: Diagnosis not present

## 2020-01-02 DIAGNOSIS — Z79899 Other long term (current) drug therapy: Secondary | ICD-10-CM | POA: Diagnosis not present

## 2020-01-02 DIAGNOSIS — I10 Essential (primary) hypertension: Secondary | ICD-10-CM | POA: Diagnosis not present

## 2020-01-02 DIAGNOSIS — M25462 Effusion, left knee: Secondary | ICD-10-CM | POA: Diagnosis not present

## 2020-01-02 DIAGNOSIS — M069 Rheumatoid arthritis, unspecified: Secondary | ICD-10-CM | POA: Diagnosis not present

## 2020-01-02 DIAGNOSIS — E785 Hyperlipidemia, unspecified: Secondary | ICD-10-CM | POA: Diagnosis not present

## 2020-01-02 DIAGNOSIS — M1990 Unspecified osteoarthritis, unspecified site: Secondary | ICD-10-CM | POA: Diagnosis not present

## 2020-02-11 MED FILL — JANUMET 50-1,000 MG TABLET: 50-1000 | 90 days supply | Qty: 180 | Fill #1

## 2020-02-12 MED FILL — ATORVASTATIN CALCIUM 20 MG: 20 | 90 days supply | Qty: 90 | Fill #1

## 2020-07-24 ENCOUNTER — Other Ambulatory Visit: Payer: Self-pay | Admitting: Pharmacist

## 2020-07-24 ENCOUNTER — Ambulatory Visit: Payer: Medicare Other | Attending: Internal Medicine | Admitting: Internal Medicine

## 2020-07-24 ENCOUNTER — Other Ambulatory Visit: Payer: Self-pay

## 2020-07-24 ENCOUNTER — Encounter: Payer: Self-pay | Admitting: Internal Medicine

## 2020-07-24 ENCOUNTER — Other Ambulatory Visit: Payer: Self-pay | Admitting: Internal Medicine

## 2020-07-24 VITALS — BP 109/63 | HR 69 | Temp 98.1°F | Resp 16 | Wt 167.2 lb

## 2020-07-24 DIAGNOSIS — R7989 Other specified abnormal findings of blood chemistry: Secondary | ICD-10-CM

## 2020-07-24 DIAGNOSIS — Z23 Encounter for immunization: Secondary | ICD-10-CM | POA: Diagnosis not present

## 2020-07-24 DIAGNOSIS — Z6828 Body mass index (BMI) 28.0-28.9, adult: Secondary | ICD-10-CM | POA: Insufficient documentation

## 2020-07-24 DIAGNOSIS — Z91128 Patient's intentional underdosing of medication regimen for other reason: Secondary | ICD-10-CM | POA: Insufficient documentation

## 2020-07-24 DIAGNOSIS — M810 Age-related osteoporosis without current pathological fracture: Secondary | ICD-10-CM | POA: Insufficient documentation

## 2020-07-24 DIAGNOSIS — E119 Type 2 diabetes mellitus without complications: Secondary | ICD-10-CM

## 2020-07-24 DIAGNOSIS — D84821 Immunodeficiency due to drugs: Secondary | ICD-10-CM | POA: Diagnosis not present

## 2020-07-24 DIAGNOSIS — E663 Overweight: Secondary | ICD-10-CM | POA: Insufficient documentation

## 2020-07-24 DIAGNOSIS — Z79899 Other long term (current) drug therapy: Secondary | ICD-10-CM | POA: Insufficient documentation

## 2020-07-24 DIAGNOSIS — M0579 Rheumatoid arthritis with rheumatoid factor of multiple sites without organ or systems involvement: Secondary | ICD-10-CM | POA: Insufficient documentation

## 2020-07-24 DIAGNOSIS — Z2821 Immunization not carried out because of patient refusal: Secondary | ICD-10-CM | POA: Diagnosis not present

## 2020-07-24 DIAGNOSIS — T383X6A Underdosing of insulin and oral hypoglycemic [antidiabetic] drugs, initial encounter: Secondary | ICD-10-CM | POA: Diagnosis not present

## 2020-07-24 DIAGNOSIS — E785 Hyperlipidemia, unspecified: Secondary | ICD-10-CM | POA: Insufficient documentation

## 2020-07-24 DIAGNOSIS — R748 Abnormal levels of other serum enzymes: Secondary | ICD-10-CM

## 2020-07-24 DIAGNOSIS — Z1211 Encounter for screening for malignant neoplasm of colon: Secondary | ICD-10-CM

## 2020-07-24 DIAGNOSIS — Z833 Family history of diabetes mellitus: Secondary | ICD-10-CM | POA: Insufficient documentation

## 2020-07-24 DIAGNOSIS — R109 Unspecified abdominal pain: Secondary | ICD-10-CM | POA: Diagnosis not present

## 2020-07-24 DIAGNOSIS — Z713 Dietary counseling and surveillance: Secondary | ICD-10-CM | POA: Diagnosis not present

## 2020-07-24 DIAGNOSIS — E559 Vitamin D deficiency, unspecified: Secondary | ICD-10-CM | POA: Insufficient documentation

## 2020-07-24 DIAGNOSIS — R945 Abnormal results of liver function studies: Secondary | ICD-10-CM | POA: Diagnosis not present

## 2020-07-24 DIAGNOSIS — E1169 Type 2 diabetes mellitus with other specified complication: Secondary | ICD-10-CM | POA: Diagnosis not present

## 2020-07-24 DIAGNOSIS — E1165 Type 2 diabetes mellitus with hyperglycemia: Secondary | ICD-10-CM | POA: Insufficient documentation

## 2020-07-24 LAB — POCT URINALYSIS DIP (CLINITEK)
Bilirubin, UA: NEGATIVE
Glucose, UA: >=1000 mg/dL — AB
Ketones, POC UA: NEGATIVE mg/dL
Leukocytes, UA: NEGATIVE
Nitrite, UA: NEGATIVE
POC PROTEIN,UA: NEGATIVE
Spec Grav, UA: <=1.005 — AB (ref 1.010–1.025)
Urobilinogen, UA: 0.2 E.U./dL
pH, UA: 5 (ref 5.0–8.0)

## 2020-07-24 LAB — POCT GLYCOSYLATED HEMOGLOBIN (HGB A1C): HbA1c, POC (controlled diabetic range): 11.8 % — AB (ref 0.0–7.0)

## 2020-07-24 LAB — GLUCOSE, POCT (MANUAL RESULT ENTRY): POC Glucose: 483 mg/dl — AB (ref 70–99)

## 2020-07-24 MED ORDER — ATORVASTATIN CALCIUM 20 MG PO TABS: 20.0000 mg | ORAL_TABLET | Freq: Every day | ORAL | 11 refills | Status: DC

## 2020-07-24 MED ORDER — ACCU-CHEK GUIDE W/DEVICE KIT: 1.0000 | PACK | Freq: Every day | 0 refills | Status: DC

## 2020-07-24 MED ORDER — JANUMET 50-1000 MG PO TABS: 1.0000 | ORAL_TABLET | Freq: Two times a day (BID) | ORAL | 1 refills | Status: DC

## 2020-07-24 MED ORDER — ACCU-CHEK SOFTCLIX LANCETS MISC: 12 refills | Status: DC

## 2020-07-24 MED ORDER — ACCU-CHEK GUIDE VI STRP: ORAL_STRIP | 12 refills | Status: DC

## 2020-07-24 MED ORDER — DICLOFENAC SODIUM 1 % EX GEL
2.0000 g | Freq: Four times a day (QID) | CUTANEOUS | 2 refills | Status: AC
Start: 2020-07-24 — End: ?

## 2020-07-24 MED ORDER — LEVEMIR FLEXTOUCH 100 UNIT/ML ~~LOC~~ SOPN: 8.0000 [IU] | PEN_INJECTOR | Freq: Every day | SUBCUTANEOUS | 11 refills | Status: DC

## 2020-07-24 MED ORDER — INSULIN ASPART 100 UNIT/ML ~~LOC~~ SOLN
15.0000 [IU] | Freq: Once | SUBCUTANEOUS | Status: AC
  Administered 2020-07-24: 15 [IU] via SUBCUTANEOUS

## 2020-07-24 MED FILL — ATORVASTATIN CALCIUM 20 MG: 20 | 90 days supply | Qty: 90 | Fill #1

## 2020-07-24 MED FILL — DICLOFENAC SODIUM 1% GEL: 1 | 13 days supply | Qty: 100 | Fill #0

## 2020-07-24 MED FILL — LEVEMIR FLEXTOUCH 100 UNITS: 100 | 90 days supply | Qty: 15 | Fill #0

## 2020-07-24 MED FILL — JANUMET 50-1,000 MG TABLET: 50-1000 | 90 days supply | Qty: 180 | Fill #0

## 2020-07-24 NOTE — Progress Notes (Signed)
Patient ID: Natalie Cherry, female    DOB: 16-Feb-1961  MRN: [id number]  CC: Diabetes and Knee Pain   Subjective: Natalie Cherry is a 59 y.o. female who presents for chronic ds management.  Last eval by me 04/2019 Her concerns today include:  Pt with hx of DM, HL, RA, osteoporosis on Prolia  DIABETES TYPE 2 Last A1C:   Results for orders placed or performed in visit on 07/24/20  POCT glucose (manual entry)  Result Value Ref Range   POC Glucose 483 (A) 70 - 99 mg/dl  POCT glycosylated hemoglobin (Hb A1C)  Result Value Ref Range   Hemoglobin A1C     HbA1c POC (<> result, manual entry)     HbA1c, POC (prediabetic range)     HbA1c, POC (controlled diabetic range) 11.8 (A) 0.0 - 7.0 %  POCT URINALYSIS DIP (CLINITEK)  Result Value Ref Range   Color, UA yellow yellow   Clarity, UA clear clear   Glucose, UA >=1,000 (A) negative mg/dL   Bilirubin, UA negative negative   Ketones, POC UA negative negative mg/dL   Spec Grav, UA <=8.295 (A) 1.010 - 1.025   Blood, UA trace-intact (A) negative   pH, UA 5.0 5.0 - 8.0   POC PROTEIN,UA negative negative, trace   Urobilinogen, UA 0.2 0.2 or 1.0 E.U./dL   Nitrite, UA Negative Negative   Leukocytes, UA Negative Negative    Med Adherence:   Yes     No -out of meds x 4 mths.  Reports she was in [place] x 4 mths looking after her brother.  She was on Amaryl and Janumet. Medication side effects:   Yes     No Home Monitoring?   Yes but not in past 2 wks because she left her glucometer and supplies in [place] Home glucose results range: Diet Adherence:  Yes     No - "I went overboard in [place] with sodas and flour."  Now back to her regular diet - less red meat, drinks mainly ater, less flour.  More fruits and veggies Exercise:  Yes  -back to walking 30 mins every third day Hypoglycemic episodes?:  Yes     No Numbness of the feet?  Yes     No Retinopathy hx?  Yes     No Last eye exam:   Over due for eye exam.  "My  eyes hurt and my vision gets tired." Comments:   HL:  Out of Atorvastatin for 4 mth  RA:  Last seen by rheumatologist 03/2020 and has f/u tomorrow.  On MTX.  Thinks Plaquenil d/c.  Gets Humara inj Q 2wks and Prolia 2 x a yr.    C/o pain RT flank intermittent x 1 mth.  Like a burning sensation on the inside.  "Its so strong it takes my breath away."  Hurts when she takes a deep breath.  No cough.  Travel from [place] last wk.  No initiating factors -can last 1 to several days.  No dysuria or hematuria. -worse when bending over and laying on RT side.  Ibuprofen makes it better.    HM:  Due for colon CA screening, pap, eye exam, flu shot and COVID vaccine.  Received 1st shot of Moderna in March.  Reports she got very sick with fever, body aches and aching in arm.  She decided not to get the second shot.  Patient Active Problem List   Diagnosis Date Noted  . Osteoporosis without current pathological fracture 12/07/2018  .  Hyperlipidemia 07/17/2018  . Type 2 diabetes mellitus with hyperglycemia, without long-term current use of insulin (HCC) 11/06/2015  . Rheumatoid arthritis (HCC) 01/29/2014  . Vitamin D deficiency 01/29/2014  . DENTAL CARIES 03/02/2009  . Osteoarthritis of spine 10/05/2007     Current Outpatient Medications on File Prior to Visit  Medication Sig Dispense Refill  . Adalimumab 40 MG/0.8ML PNKT Inject into the skin.    Marland Kitchen denosumab (PROLIA) 60 MG/ML SOSY injection Inject 60 mg into the skin every 6 (six) months.    . folic acid (FOLVITE) 1 MG tablet Take 1 tablet (1 mg total) by mouth daily. 90 tablet 2  . hydroxychloroquine (PLAQUENIL) 200 MG tablet Take by mouth.    . Insulin Pen Needle (PEN NEEDLES) 31G X 5 MM MISC 1 Dose by Does not apply route daily. (Patient not taking: Reported on 08/30/2018) 100 each 0  . Methotrexate, PF, 25 MG/0.4ML SOAJ Inject 0.7 mL [place] one day a week    . nystatin cream (MYCOSTATIN) Apply 1 application topically 3 (three) times daily. As needed for  itchy rash 30 g 3  . tiZANidine (ZANAFLEX) 2 MG tablet Take 1-2 tablets (2-4 mg total) by mouth every 6 (six) hours as needed for muscle spasms. 60 tablet 1   No current facility-administered medications on file prior to visit.    No Known Allergies  Social History   Socioeconomic History  . Marital status: Married    Spouse name: Not on file  . Number of children: Not on file  . Years of education: Not on file  . Highest education level: Not on file  Occupational History  . Not on file  Tobacco Use  . Smoking status: Never Smoker  . Smokeless tobacco: Never Used  Vaping Use  . Vaping Use: Never used  Substance and Sexual Activity  . Alcohol use: No  . Drug use: No  . Sexual activity: Yes  Other Topics Concern  . Not on file  Social History Narrative  . Not on file   Social Determinants of Health   Financial Resource Strain:   . Difficulty of Paying Living Expenses: Not on file  Food Insecurity:   . Worried About Programme researcher, broadcasting/film/video in the Last Year: Not on file  . Ran Out of Food in the Last Year: Not on file  Transportation Needs:   . Lack of Transportation (Medical): Not on file  . Lack of Transportation (Non-Medical): Not on file  Physical Activity:   . Days of Exercise per Week: Not on file  . Minutes of Exercise per Session: Not on file  Stress:   . Feeling of Stress : Not on file  Social Connections:   . Frequency of Communication with Friends and Family: Not on file  . Frequency of Social Gatherings with Friends and Family: Not on file  . Attends Religious Services: Not on file  . Active Member of Clubs or Organizations: Not on file  . Attends Banker Meetings: Not on file  . Marital Status: Not on file  Intimate Partner Violence:   . Fear of Current or Ex-Partner: Not on file  . Emotionally Abused: Not on file  . Physically Abused: Not on file  . Sexually Abused: Not on file    Family History  Problem Relation Age of Onset  .  Diabetes Mother     No past surgical history on file.  ROS: Review of Systems Negative except as stated above  PHYSICAL EXAM:  BP 109/63   Pulse 69   Temp 98.1 F (36.7 C)   Resp 16   Wt 167 lb 3.2 oz (75.8 kg)   SpO2 96%   BMI 28.70 kg/m   Wt Readings from Last 3 Encounters:  07/24/20 167 lb 3.2 oz (75.8 kg)  12/07/18 171 lb 9.6 oz (77.8 kg)  08/30/18 164 lb 12.8 oz (74.8 kg)    Physical Exam  General appearance - alert, well appearing, and in no distress Neck - supple, no significant adenopathy Chest - clear to auscultation, no wheezes, rales or rhonchi, symmetric air entry Heart - normal rate, regular rhythm, normal S1, S2, no murmurs, rubs, clicks or gallops Abdomen - soft, nontender, nondistended, no masses or organomegaly Musculoskeletal - slight tenderness on palpation of lower 1/2 RT flank.  Deformities of hands and wrists noted Extremities - peripheral pulses normal, no pedal edema, no clubbing or cyanosis Leap exam: Feet without ulcers or calluses.  Dorsalis pedis/posterior tibialis pulses 3+ bilaterally.  She has good sensation on the plantar and dorsal surface of both feet.  CMP Latest Ref Rng & Units 05/29/2019 04/16/2018 05/10/2017  Glucose 65 - 99 mg/dL 846(K) 599(J) 570(V)  BUN 6 - 24 mg/dL 15 14 9   Creatinine 0.57 - 1.00 mg/dL 7.79 3.90 3.00  Sodium 134 - 144 mmol/L 136 140 137  Potassium 3.5 - 5.2 mmol/L 4.3 4.3 4.8  Chloride 96 - 106 mmol/L 99 104 100  CO2 20 - 29 mmol/L 20 21 23   Calcium 8.7 - 10.2 mg/dL 9.5 9.3 9.6  Total Protein 6.0 - 8.5 g/dL 7.7 7.4 7.8  Total Bilirubin 0.0 - 1.2 mg/dL <9.2 <3.3 <0.0  Alkaline Phos 39 - 117 IU/L 122(H) 104 131(H)  AST 0 - 40 IU/L 19 15 19   ALT 0 - 32 IU/L 30 17 15    Lipid Panel     Component Value Date/Time   CHOL 207 (H) 05/29/2019 1027   TRIG 237 (H) 05/29/2019 1027   HDL 40 05/29/2019 1027   CHOLHDL 5.2 (H) 05/29/2019 1027   CHOLHDL 4.6 12/13/2016 1149   VLDL 41 (H) 12/13/2016 1149   LDLCALC 120 (H)  05/29/2019 1027    CBC    Component Value Date/Time   WBC 8.1 05/29/2019 1027   WBC 5.1 12/13/2016 1149   RBC 4.84 05/29/2019 1027   RBC 4.72 12/13/2016 1149   HGB 13.4 05/29/2019 1027   HCT 41.8 05/29/2019 1027   PLT 426 05/29/2019 1027   MCV 86 05/29/2019 1027   MCH 27.7 05/29/2019 1027   MCH 28.8 12/13/2016 1149   MCHC 32.1 05/29/2019 1027   MCHC 32.6 12/13/2016 1149   RDW 13.2 05/29/2019 1027   LYMPHSABS 2.4 11/06/2015 0953   MONOABS 0.4 11/06/2015 0953   EOSABS 0.3 11/06/2015 0953   BASOSABS 0.0 11/06/2015 0953    ASSESSMENT AND PLAN: 1. Uncontrolled type 2 diabetes mellitus with hyperglycemia (HCC) Uncontrolled due to being out of medications for 4 months.  I recommend restarting Janumet.  Hold off on Amaryl.  Start Levemir instead 8 units at bedtime.  Clinical pharmacist to do teaching on insulin.  Testing supplies sent to pharmacy of her choice Discussed and encourage healthy eating habits and regular exercise.  Printed information on healthy eating habits in diabetics given to patient. -Patient given 15 units of NovoLog here in the clinic. - POCT glucose (manual entry) - POCT glycosylated hemoglobin (Hb A1C) - insulin aspart (novoLOG) injection 15 Units - POCT URINALYSIS DIP (CLINITEK) -  Ambulatory referral to Ophthalmology - CBC - Comprehensive metabolic panel - Lipid panel - sitaGLIPtin-metformin (JANUMET) 50-1000 MG tablet; Take 1 tablet by mouth 2 (two) times daily with a meal.  Dispense: 180 tablet; Refill: 1 - insulin detemir (LEVEMIR FLEXTOUCH) 100 UNIT/ML FlexPen; Inject 8 Units into the skin daily.  Dispense: 15 mL; Refill: 11 - Microalbumin / creatinine urine ratio  2. Hyperlipidemia associated with type 2 diabetes mellitus (HCC) - atorvastatin (LIPITOR) 20 MG tablet; Take 1 tablet (20 mg total) by mouth daily.  Dispense: 30 tablet; Refill: 11  3. Flank pain, acute Likely MSK in nature. We will try her with some Voltaren gel.  Follow-up if no  improvement - diclofenac Sodium (VOLTAREN) 1 % GEL; Apply 2 g topically 4 (four) times daily.  Dispense: 100 g; Refill: 2  4. Rheumatoid arthritis involving multiple sites with positive rheumatoid factor (HCC) Followed by rheumatology.  She is on Humira and methotrexate, folic acid  5. Osteoporosis without current pathological fracture, unspecified osteoporosis type On Prolia through her rheumatologist  6. Over weight See #1 above  7. Screening for colon cancer Discussed colon cancer screening methods.  Patient prefers to have the Cologuard test done. - Cologuard  8. Influenza vaccination declined This was offered and recommended.  Patient declined.  Encourage patient to complete the COVID-19 vaccine series given that she is immunosuppressed.     Patient was given the opportunity to ask questions.  Patient verbalized understanding of the plan and was able to repeat key elements of the plan.  [employer] interpreter used during this encounter. #[id number]  Orders Placed This Encounter  Procedures  . Cologuard  . CBC  . Comprehensive metabolic panel  . Lipid panel  . Microalbumin / creatinine urine ratio  . Ambulatory referral to Ophthalmology  . POCT glucose (manual entry)  . POCT glycosylated hemoglobin (Hb A1C)  . POCT URINALYSIS DIP (CLINITEK)     Requested Prescriptions   Signed Prescriptions Disp Refills  . sitaGLIPtin-metformin (JANUMET) 50-1000 MG tablet 180 tablet 1    Sig: Take 1 tablet by mouth 2 (two) times daily with a meal.  . insulin detemir (LEVEMIR FLEXTOUCH) 100 UNIT/ML FlexPen 15 mL 11    Sig: Inject 8 Units into the skin daily.  . diclofenac Sodium (VOLTAREN) 1 % GEL 100 g 2    Sig: Apply 2 g topically 4 (four) times daily.  Marland Kitchen atorvastatin (LIPITOR) 20 MG tablet 30 tablet 11    Sig: Take 1 tablet (20 mg total) by mouth daily.    Return in about 7 weeks (around 09/11/2020) for pap.  Jonah Blue, MD, FACP

## 2020-07-24 NOTE — Patient Instructions (Signed)
Diabetes mellitus y nutricin, en adultos Diabetes Mellitus and Nutrition, Adult Si sufre de diabetes (diabetes mellitus), es muy importante tener hbitos alimenticios saludables debido a que sus niveles de azcar en la sangre (glucosa) se ven afectados en gran medida por lo que come y bebe. Comer alimentos saludables en las cantidades adecuadas, aproximadamente a la misma hora todos los das, lo ayudar a:  Controlar la glucemia.  Disminuir el riesgo de sufrir una enfermedad cardaca.  Mejorar la presin arterial.  Alcanzar o mantener un peso saludable. Todas las personas que sufren de diabetes son diferentes y cada una tiene necesidades diferentes en cuanto a un plan de alimentacin. El mdico puede recomendarle que trabaje con un especialista en dietas y nutricin (nutricionista) para elaborar el mejor plan para usted. Su plan de alimentacin puede variar segn factores como:  Las caloras que necesita.  Los medicamentos que toma.  Su peso.  Sus niveles de glucemia, presin arterial y colesterol.  Su nivel de actividad.  Otras afecciones que tenga, como enfermedades cardacas o renales. Cmo me afectan los carbohidratos? Los carbohidratos, o hidratos de carbono, afectan su nivel de glucemia ms que cualquier otro tipo de alimento. La ingesta de carbohidratos naturalmente aumenta la cantidad de glucosa en la sangre. El recuento de carbohidratos es un mtodo destinado a llevar un registro de la cantidad de carbohidratos que se consumen. El recuento de carbohidratos es importante para mantener la glucemia a un nivel saludable, especialmente si utiliza insulina o toma determinados medicamentos por va oral para la diabetes. Es importante conocer la cantidad de carbohidratos que se pueden ingerir en cada comida sin correr ningn riesgo. Esto es diferente en cada persona. Su nutricionista puede ayudarlo a calcular la cantidad de carbohidratos que debe ingerir en cada comida y en cada  refrigerio. Entre los alimentos que contienen carbohidratos, se incluyen:  Pan, cereal, arroz, pastas y galletas.  Papas y maz.  Guisantes, frijoles y lentejas.  Leche y yogur.  Frutas y jugo.  Postres, como pasteles, galletas, helado y caramelos. Cmo me afecta el alcohol? El alcohol puede provocar disminuciones sbitas de la glucemia (hipoglucemia), especialmente si utiliza insulina o toma determinados medicamentos por va oral para la diabetes. La hipoglucemia es una afeccin potencialmente mortal. Los sntomas de la hipoglucemia (somnolencia, mareos y confusin) son similares a los sntomas de haber consumido demasiado alcohol. Si el mdico afirma que el alcohol es seguro para usted, siga estas pautas:  Limite el consumo de alcohol a no ms de 1medida por da si es mujer y no est embarazada, y a 2medidas si es hombre. Una medida equivale a 12oz (355ml) de cerveza, 5oz (148ml) de vino o 1oz (44ml) de bebidas alcohlicas de alta graduacin.  No beba con el estmago vaco.  Mantngase hidratado bebiendo agua, refrescos dietticos o t helado sin azcar.  Tenga en cuenta que los refrescos comunes, los jugos y otras bebida para mezclar pueden contener mucha azcar y se deben contar como carbohidratos. Cules son algunos consejos para seguir este plan?  Leer las etiquetas de los alimentos  Comience por leer el tamao de la porcin en la "Informacin nutricional" en las etiquetas de los alimentos envasados y las bebidas. La cantidad de caloras, carbohidratos, grasas y otros nutrientes mencionados en la etiqueta se basan en una porcin del alimento. Muchos alimentos contienen ms de una porcin por envase.  Verifique la cantidad total de gramos (g) de carbohidratos totales en una porcin. Puede calcular la cantidad de porciones de carbohidratos al dividir el   total de carbohidratos por 15. Por ejemplo, si un alimento tiene un total de 30g de carbohidratos, equivale a 2  porciones de carbohidratos.  Verifique la cantidad de gramos (g) de grasas saturadas y grasas trans en una porcin. Escoja alimentos que no contengan grasa o que tengan un bajo contenido.  Verifique la cantidad de miligramos (mg) de sal (sodio) en una porcin. La mayora de las personas deben limitar la ingesta de sodio total a menos de 2300mg por da.  Siempre consulte la informacin nutricional de los alimentos etiquetados como "con bajo contenido de grasa" o "sin grasa". Estos alimentos pueden tener un mayor contenido de azcar agregada o carbohidratos refinados, y deben evitarse.  Hable con su nutricionista para identificar sus objetivos diarios en cuanto a los nutrientes mencionados en la etiqueta. Al ir de compras  Evite comprar alimentos procesados, enlatados o precocinados. Estos alimentos tienden a tener una mayor cantidad de grasa, sodio y azcar agregada.  Compre en la zona exterior de la tienda de comestibles. Esta zona incluye frutas y verduras frescas, granos a granel, carnes frescas y productos lcteos frescos. Al cocinar  Utilice mtodos de coccin a baja temperatura, como hornear, en lugar de mtodos de coccin a alta temperatura, como frer en abundante aceite.  Cocine con aceites saludables, como el aceite de oliva, canola o girasol.  Evite cocinar con manteca, crema o carnes con alto contenido de grasa. Planificacin de las comidas  Coma las comidas y los refrigerios regularmente, preferentemente a la misma hora todos los das. Evite pasar largos perodos de tiempo sin comer.  Consuma alimentos ricos en fibra, como frutas frescas, verduras, frijoles y cereales integrales. Consulte a su nutricionista sobre cuntas porciones de carbohidratos puede consumir en cada comida.  Consuma entre 4 y 6 onzas (oz) de protenas magras por da, como carnes magras, pollo, pescado, huevos o tofu. Una onza de protena magra equivale a: ? 1 onza de carne, pollo o  pescado. ? 1huevo. ?  taza de tofu.  Coma algunos alimentos por da que contengan grasas saludables, como aguacates, frutos secos, semillas y pescado. Estilo de vida  Controle su nivel de glucemia con regularidad.  Haga actividad fsica habitualmente como se lo haya indicado el mdico. Esto puede incluir lo siguiente: ? 150minutos semanales de ejercicio de intensidad moderada o alta. Esto podra incluir caminatas dinmicas, ciclismo o gimnasia acutica. ? Realizar ejercicios de elongacin y de fortalecimiento, como yoga o levantamiento de pesas, por lo menos 2veces por semana.  Tome los medicamentos como se lo haya indicado el mdico.  No consuma ningn producto que contenga nicotina o tabaco, como cigarrillos y cigarrillos electrnicos. Si necesita ayuda para dejar de fumar, consulte al mdico.  Trabaje con un asesor o instructor en diabetes para identificar estrategias para controlar el estrs y cualquier desafo emocional y social. Preguntas para hacerle al mdico  Es necesario que consulte a un instructor en el cuidado de la diabetes?  Es necesario que me rena con un nutricionista?  A qu nmero puedo llamar si tengo preguntas?  Cules son los mejores momentos para controlar la glucemia? Dnde encontrar ms informacin:  Asociacin Estadounidense de la Diabetes (American Diabetes Association): diabetes.org  Academia de Nutricin y Diettica (Academy of Nutrition and Dietetics): www.eatright.org  Instituto Nacional de la Diabetes y las Enfermedades Digestivas y Renales (National Institute of Diabetes and Digestive and Kidney Diseases, NIH): www.niddk.nih.gov Resumen  Un plan de alimentacin saludable lo ayudar a controlar la glucemia y mantener un estilo de vida saludable.    Trabajar con un especialista en dietas y nutricin (nutricionista) puede ayudarlo a elaborar el mejor plan de alimentacin para usted.  Tenga en cuenta que los carbohidratos (hidratos de  carbono) y el alcohol tienen efectos inmediatos en sus niveles de glucemia. Es importante contar los carbohidratos que ingiere y consumir alcohol con prudencia. Esta informacin no tiene como fin reemplazar el consejo del mdico. Asegrese de hacerle al mdico cualquier pregunta que tenga. Document Revised: 07/25/2017 Document Reviewed: 03/06/2017 Elsevier Patient Education  2020 Elsevier Inc.  

## 2020-07-24 NOTE — Progress Notes (Signed)
Pt states her pain is coming from her right side and has been off and on for a 1 month   Pt has been out of medications for 3 months

## 2020-07-25 LAB — COMPREHENSIVE METABOLIC PANEL
ALT: 55 IU/L — ABNORMAL HIGH (ref 0–32)
AST: 26 IU/L (ref 0–40)
Albumin/Globulin Ratio: 1.1 — ABNORMAL LOW (ref 1.2–2.2)
Albumin: 4.1 g/dL (ref 3.8–4.9)
Alkaline Phosphatase: 193 IU/L — ABNORMAL HIGH (ref 48–121)
BUN/Creatinine Ratio: 22 (ref 9–23)
BUN: 15 mg/dL (ref 6–24)
Bilirubin Total: <0.2 mg/dL (ref 0.0–1.2)
CO2: 24 mmol/L (ref 20–29)
Calcium: 9.6 mg/dL (ref 8.7–10.2)
Chloride: 97 mmol/L (ref 96–106)
Creatinine, Ser: 0.68 mg/dL (ref 0.57–1.00)
GFR calc Af Amer: 111 mL/min/{1.73_m2} (ref >59)
GFR calc non Af Amer: 96 mL/min/{1.73_m2} (ref >59)
Globulin, Total: 3.6 g/dL (ref 1.5–4.5)
Glucose: 417 mg/dL — ABNORMAL HIGH (ref 65–99)
Potassium: 4.7 mmol/L (ref 3.5–5.2)
Sodium: 132 mmol/L — ABNORMAL LOW (ref 134–144)
Total Protein: 7.7 g/dL (ref 6.0–8.5)

## 2020-07-25 LAB — MICROALBUMIN / CREATININE URINE RATIO
Creatinine, Urine: 25.8 mg/dL
Microalb/Creat Ratio: <12 mg/g creat (ref 0–29)
Microalbumin, Urine: <3 ug/mL

## 2020-07-25 LAB — CBC
Hematocrit: 36.9 % (ref 34.0–46.6)
Hemoglobin: 12 g/dL (ref 11.1–15.9)
MCH: 29.1 pg (ref 26.6–33.0)
MCHC: 32.5 g/dL (ref 31.5–35.7)
MCV: 89 fL (ref 79–97)
Platelets: 364 10*3/uL (ref 150–450)
RBC: 4.13 x10E6/uL (ref 3.77–5.28)
RDW: 12.7 % (ref 11.7–15.4)
WBC: 6.2 10*3/uL (ref 3.4–10.8)

## 2020-07-25 LAB — LIPID PANEL
Chol/HDL Ratio: 3.6 ratio (ref 0.0–4.4)
Cholesterol, Total: 192 mg/dL (ref 100–199)
HDL: 53 mg/dL (ref >39)
LDL Chol Calc (NIH): 122 mg/dL — ABNORMAL HIGH (ref 0–99)
Triglycerides: 91 mg/dL (ref 0–149)
VLDL Cholesterol Cal: 17 mg/dL (ref 5–40)

## 2020-07-26 NOTE — Progress Notes (Signed)
Let patient know that her blood count is normal that her white, red and platelet cell counts are normal.  She had mild elevation in 2 of her liver function tests.  I will asked the lab to do some additional blood test to further evaluate this elevation.  I will get back to her once I have the results.  Her LDL cholesterol is 122 with goal being less than 70.  We restarted the atorvastatin on her recent visit.  She does not have any significant protein buildup in the urine which is good.

## 2020-07-26 NOTE — Addendum Note (Signed)
Addended by: Jonah Blue B on: 07/26/2020 03:00 PM   Modules accepted: Orders

## 2020-07-27 ENCOUNTER — Telehealth: Payer: Self-pay

## 2020-07-27 NOTE — Telephone Encounter (Signed)
[employer] interpreters [place]  Id# [id number]  contacted pt to go over lab results pt didn't answer lvm asking pt to give a call back at their earliest convenience  ° °

## 2020-07-28 DIAGNOSIS — M810 Age-related osteoporosis without current pathological fracture: Secondary | ICD-10-CM | POA: Diagnosis not present

## 2020-07-28 DIAGNOSIS — M1990 Unspecified osteoarthritis, unspecified site: Secondary | ICD-10-CM | POA: Diagnosis not present

## 2020-07-28 DIAGNOSIS — Z79899 Other long term (current) drug therapy: Secondary | ICD-10-CM | POA: Diagnosis not present

## 2020-07-28 DIAGNOSIS — M069 Rheumatoid arthritis, unspecified: Secondary | ICD-10-CM | POA: Diagnosis not present

## 2020-07-28 DIAGNOSIS — I10 Essential (primary) hypertension: Secondary | ICD-10-CM | POA: Diagnosis not present

## 2020-07-28 DIAGNOSIS — E119 Type 2 diabetes mellitus without complications: Secondary | ICD-10-CM | POA: Diagnosis not present

## 2020-07-28 DIAGNOSIS — E785 Hyperlipidemia, unspecified: Secondary | ICD-10-CM | POA: Diagnosis not present

## 2020-07-28 DIAGNOSIS — M25569 Pain in unspecified knee: Secondary | ICD-10-CM | POA: Diagnosis not present

## 2020-07-29 ENCOUNTER — Telehealth: Payer: Self-pay | Admitting: Internal Medicine

## 2020-07-29 ENCOUNTER — Encounter: Payer: Self-pay | Admitting: Gastroenterology

## 2020-07-29 DIAGNOSIS — Z23 Encounter for immunization: Secondary | ICD-10-CM | POA: Diagnosis not present

## 2020-07-29 DIAGNOSIS — R7989 Other specified abnormal findings of blood chemistry: Secondary | ICD-10-CM

## 2020-07-29 LAB — HEPATITIS C ANTIBODY: Hep C Virus Ab: <0.1 s/co ratio (ref 0.0–0.9)

## 2020-07-29 LAB — SPECIMEN STATUS REPORT

## 2020-07-29 LAB — GAMMA GT: GGT: 118 IU/L — ABNORMAL HIGH (ref 0–60)

## 2020-07-29 NOTE — Telephone Encounter (Signed)
Patient unable to be reached with lab results.  She has abnormal LFTs.  I will send her a lab letter.  Referral submitted to gastroenterology.

## 2020-07-29 NOTE — Telephone Encounter (Signed)
Letter sent.

## 2020-09-02 ENCOUNTER — Telehealth: Payer: Self-pay | Admitting: Internal Medicine

## 2020-09-02 DIAGNOSIS — E119 Type 2 diabetes mellitus without complications: Secondary | ICD-10-CM

## 2020-09-02 DIAGNOSIS — Z794 Long term (current) use of insulin: Secondary | ICD-10-CM

## 2020-09-02 MED ORDER — ACCU-CHEK GUIDE VI STRP: ORAL_STRIP | 2 refills | Status: DC

## 2020-09-02 NOTE — Telephone Encounter (Signed)
Rx sent 

## 2020-09-02 NOTE — Telephone Encounter (Signed)
Medication Refill - Medication: Accucheck Test strips   Has the patient contacted their pharmacy? Yes.   Needing to have frequency changed due to the insurance denying the refill request. Please advise.  (Agent: If no, request that the patient contact the pharmacy for the refill.) (Agent: If yes, when and what did the pharmacy advise?)  Preferred Pharmacy (with phone number or street name):  [facility] [address] [place], [place] - [address]  [address] [place] [address]  Phone: [phone] Fax: [phone]  Hours: Not open 24 hours     Agent: Please be advised that RX refills may take up to 3 business days. We ask that you follow-up with your pharmacy.

## 2020-09-30 ENCOUNTER — Telehealth: Payer: Self-pay | Admitting: Internal Medicine

## 2020-09-30 NOTE — Telephone Encounter (Signed)
Called pt unable to reach reached her daughter Pt signed a DPR auth all [facility] to verbally disclose medical information to (Daughter) Carolan Shiver, Md message relayed to daughter. Stressed the importance of sending the specimen to be processed . Verbalized understanding

## 2020-10-05 ENCOUNTER — Other Ambulatory Visit (INDEPENDENT_AMBULATORY_CARE_PROVIDER_SITE_OTHER): Payer: Medicare Other

## 2020-10-05 ENCOUNTER — Encounter: Payer: Self-pay | Admitting: Gastroenterology

## 2020-10-05 ENCOUNTER — Ambulatory Visit (INDEPENDENT_AMBULATORY_CARE_PROVIDER_SITE_OTHER): Payer: Medicare Other | Admitting: Gastroenterology

## 2020-10-05 VITALS — BP 102/62 | HR 75 | Ht 64.0 in | Wt 165.8 lb

## 2020-10-05 DIAGNOSIS — R7989 Other specified abnormal findings of blood chemistry: Secondary | ICD-10-CM

## 2020-10-05 DIAGNOSIS — R1013 Epigastric pain: Secondary | ICD-10-CM

## 2020-10-05 DIAGNOSIS — R5383 Other fatigue: Secondary | ICD-10-CM

## 2020-10-05 DIAGNOSIS — R945 Abnormal results of liver function studies: Secondary | ICD-10-CM | POA: Diagnosis not present

## 2020-10-05 DIAGNOSIS — M069 Rheumatoid arthritis, unspecified: Secondary | ICD-10-CM | POA: Diagnosis not present

## 2020-10-05 DIAGNOSIS — E1165 Type 2 diabetes mellitus with hyperglycemia: Secondary | ICD-10-CM

## 2020-10-05 DIAGNOSIS — Z1211 Encounter for screening for malignant neoplasm of colon: Secondary | ICD-10-CM

## 2020-10-05 DIAGNOSIS — R1011 Right upper quadrant pain: Secondary | ICD-10-CM

## 2020-10-05 DIAGNOSIS — E785 Hyperlipidemia, unspecified: Secondary | ICD-10-CM | POA: Diagnosis not present

## 2020-10-05 DIAGNOSIS — E1169 Type 2 diabetes mellitus with other specified complication: Secondary | ICD-10-CM

## 2020-10-05 LAB — CBC WITH DIFFERENTIAL/PLATELET
Basophils Absolute: 0.1 10*3/uL (ref 0.0–0.1)
Basophils Relative: 1.3 % (ref 0.0–3.0)
Eosinophils Absolute: 0.2 10*3/uL (ref 0.0–0.7)
Eosinophils Relative: 2.2 % (ref 0.0–5.0)
HCT: 35.1 % — ABNORMAL LOW (ref 36.0–46.0)
Hemoglobin: 11.6 g/dL — ABNORMAL LOW (ref 12.0–15.0)
Lymphocytes Relative: 28.9 % (ref 12.0–46.0)
Lymphs Abs: 2.3 10*3/uL (ref 0.7–4.0)
MCHC: 33.1 g/dL (ref 30.0–36.0)
MCV: 86.7 fl (ref 78.0–100.0)
Monocytes Absolute: 0.4 10*3/uL (ref 0.1–1.0)
Monocytes Relative: 5.3 % (ref 3.0–12.0)
Neutro Abs: 5 10*3/uL (ref 1.4–7.7)
Neutrophils Relative %: 62.3 % (ref 43.0–77.0)
Platelets: 446 10*3/uL — ABNORMAL HIGH (ref 150.0–400.0)
RBC: 4.05 Mil/uL (ref 3.87–5.11)
RDW: 13 % (ref 11.5–15.5)
WBC: 8.1 10*3/uL (ref 4.0–10.5)

## 2020-10-05 LAB — COMPREHENSIVE METABOLIC PANEL
ALT: 16 U/L (ref 0–35)
AST: 15 U/L (ref 0–37)
Albumin: 4.1 g/dL (ref 3.5–5.2)
Alkaline Phosphatase: 113 U/L (ref 39–117)
BUN: 11 mg/dL (ref 6–23)
CO2: 25 mEq/L (ref 19–32)
Calcium: 8.8 mg/dL (ref 8.4–10.5)
Chloride: 106 mEq/L (ref 96–112)
Creatinine, Ser: 0.67 mg/dL (ref 0.40–1.20)
GFR: 95.38 mL/min (ref >60.00)
Glucose, Bld: 89 mg/dL (ref 70–99)
Potassium: 3.7 mEq/L (ref 3.5–5.1)
Sodium: 139 mEq/L (ref 135–145)
Total Bilirubin: 0.2 mg/dL (ref 0.2–1.2)
Total Protein: 7.7 g/dL (ref 6.0–8.3)

## 2020-10-05 LAB — FERRITIN: Ferritin: 56.2 ng/mL (ref 10.0–291.0)

## 2020-10-05 LAB — SEDIMENTATION RATE: Sed Rate: 71 mm/hr — ABNORMAL HIGH (ref 0–30)

## 2020-10-05 NOTE — Progress Notes (Signed)
Natalie Cherry    [id number]    04-19-1961  Primary Care Physician:Johnson, Dalbert Batman, MD  Referring Physician: Ladell Pier, MD [address] [place],  [place] [address]   Chief complaint:  Abnormal LFT  HPI:  59 year old very pleasant female with history of type 2 diabetes, rheumatoid arthritis on Humira here for new patient visit for evaluation of abnormal LFT and epigastric abdominal pain. Symptoms are worse in the past 3 to 4 months when she started noticing abdominal discomfort  Maalox as needed for heartburn and GERD symptoms  Take Ibuprofen as needed on average once a week for arthritis  She has elevated alkaline phosphatase, progressively increased since July along with mild elevation in AST and ALT  Denies any new medications, sick contacts or travel history.  Never had EGD or colonoscopy for colorectal cancer screening.  No family history of GI malignancy.  Hepatic Function Latest Ref Rng & Units 07/24/2020 05/29/2019 04/16/2018  Total Protein 6.0 - 8.5 g/dL 7.7 7.7 7.4  Albumin 3.8 - 4.9 g/dL 4.1 4.1 4.0  AST 0 - 40 IU/L '26 19 15  ' ALT 0 - 32 IU/L 55(H) 30 17  Alk Phosphatase 48 - 121 IU/L 193(H) 122(H) 104  Total Bilirubin 0.0 - 1.2 mg/dL <0.2 <0.2 <0.2  Bilirubin, Direct 0.0 - 0.3 mg/dL - - -     Outpatient Encounter Medications as of 10/05/2020  Medication Sig  . Accu-Chek Softclix Lancets lancets Use as instructed to check blood sugar three times daily. E11.9  . Adalimumab 40 MG/0.8ML PNKT Inject 0.8 mLs into the skin every 14 (fourteen) days.   Marland Kitchen atorvastatin (LIPITOR) 20 MG tablet Take 1 tablet (20 mg total) by mouth daily.  . Blood Glucose Monitoring Suppl (ACCU-CHEK GUIDE) w/Device KIT 1 each by Does not apply route daily. Use as instructed to check blood sugar three times daily. E11.9  . denosumab (PROLIA) 60 MG/ML SOSY injection Inject 60 mg into the skin every 6 (six) months.  . diclofenac Sodium (VOLTAREN) 1 % GEL Apply 2 g topically 4  (four) times daily.  Marland Kitchen glucose blood (ACCU-CHEK GUIDE) test strip Use as instructed to check blood sugar three times daily. E11.9  . hydroxychloroquine (PLAQUENIL) 200 MG tablet Take 200 mg by mouth daily.   . insulin detemir (LEVEMIR FLEXTOUCH) 100 UNIT/ML FlexPen Inject 8 Units into the skin daily.  . Insulin Pen Needle (PEN NEEDLES) 31G X 5 MM MISC 1 Dose by Does not apply route daily.  . Methotrexate, PF, 25 MG/0.4ML SOAJ Inject 0.7 mLs into the skin once a week.   . sitaGLIPtin-metformin (JANUMET) 50-1000 MG tablet Take 1 tablet by mouth 2 (two) times daily with a meal.  . [DISCONTINUED] folic acid (FOLVITE) 1 MG tablet Take 1 tablet (1 mg total) by mouth daily.  . [DISCONTINUED] nystatin cream (MYCOSTATIN) Apply 1 application topically 3 (three) times daily. As needed for itchy rash  . [DISCONTINUED] tiZANidine (ZANAFLEX) 2 MG tablet Take 1-2 tablets (2-4 mg total) by mouth every 6 (six) hours as needed for muscle spasms.   No facility-administered encounter medications on file as of 10/05/2020.    Allergies as of 10/05/2020  . (No Known Allergies)    Past Medical History:  Diagnosis Date  . Arthritis   . Diabetes mellitus   . Hypertension     Past Surgical History:  Procedure Laterality Date  . CHOLECYSTECTOMY     approx 6 yrs ago in  [place]  . COLONOSCOPY     approx 15 years ago in [place]    Family History  Problem Relation Age of Onset  . Diabetes Mother   . Asthma Father   . Colon cancer Neg Hx   . Stomach cancer Neg Hx   . Esophageal cancer Neg Hx   . Pancreatic cancer Neg Hx   . Liver disease Neg Hx     Social History   Socioeconomic History  . Marital status: Married    Spouse name: Not on file  . Number of children: Not on file  . Years of education: Not on file  . Highest education level: Not on file  Occupational History  . Not on file  Tobacco Use  . Smoking status: Never Smoker  . Smokeless tobacco: Never Used  Vaping Use  . Vaping Use: Never used    Substance and Sexual Activity  . Alcohol use: No  . Drug use: No  . Sexual activity: Not Currently  Other Topics Concern  . Not on file  Social History Narrative  . Not on file   Social Determinants of Health   Financial Resource Strain:   . Difficulty of Paying Living Expenses: Not on file  Food Insecurity:   . Worried About Charity fundraiser in the Last Year: Not on file  . Ran Out of Food in the Last Year: Not on file  Transportation Needs:   . Lack of Transportation (Medical): Not on file  . Lack of Transportation (Non-Medical): Not on file  Physical Activity:   . Days of Exercise per Week: Not on file  . Minutes of Exercise per Session: Not on file  Stress:   . Feeling of Stress : Not on file  Social Connections:   . Frequency of Communication with Friends and Family: Not on file  . Frequency of Social Gatherings with Friends and Family: Not on file  . Attends Religious Services: Not on file  . Active Member of Clubs or Organizations: Not on file  . Attends Archivist Meetings: Not on file  . Marital Status: Not on file  Intimate Partner Violence:   . Fear of Current or Ex-Partner: Not on file  . Emotionally Abused: Not on file  . Physically Abused: Not on file  . Sexually Abused: Not on file      Review of systems: All other review of systems negative except as mentioned in the HPI.   Physical Exam: Vitals:   10/05/20 1432  BP: 102/62  Pulse: 75  SpO2: 99%   Body mass index is 28.46 kg/m. Gen:      No acute distress HEENT:  sclera anicteric Abd:      soft, epigastric tender; palpable firm left lobe of liver  Ext:    No edema Neuro: alert and oriented x 3 Psych: normal mood and affect  Data Reviewed:  Reviewed labs, radiology imaging, old records and pertinent past GI work up   Assessment and Plan/Recommendations:  59 year old very pleasant female, Spanish speaking accompanied by her daughter here for new patient visit with  complaints of upper abdominal pain associated with abnormal LFT, heartburn, and reflux symptoms She has firm palpable left lobe of liver associated with tenderness Repeat CBC, CMP, PT, INR, ferritin, ESR, ceruloplasmin, alpha-1 antitrypsin hepatitis panel, ANA to exclude any chronic liver disease Obtain abdominal ultrasound  We will schedule for EGD to evaluate recent onset GERD symptoms, exclude erosive esophagitis or peptic ulcer disease or H.  pylori gastritis Avoid NSAIDs Antireflux measures She is due for colorectal cancer screening, schedule for colonoscopy along with EGD  The risks and benefits as well as alternatives of endoscopic procedure(s) have been discussed and reviewed. All questions answered. The patient agrees to proceed.   This visit required >60 minutes of patient care (this includes precharting, chart review, review of results, face-to-face time used for counseling as well as treatment plan and follow-up. The patient was provided an opportunity to ask questions and all were answered. The patient agreed with the plan and demonstrated an understanding of the instructions.  Damaris Hippo , MD    CC: Ladell Pier, MD

## 2020-10-05 NOTE — Patient Instructions (Signed)
You have been scheduled for an endoscopy and colonoscopy. Please follow the written instructions given to you at your visit today. Please pick up your prep supplies at the pharmacy within the next 1-3 days. If you use inhalers (even only as needed), please bring them with you on the day of your procedure.   If you are age 60 or older, your body mass index should be between 23-30. Your Body mass index is 28.46 kg/m. If this is out of the aforementioned range listed, please consider follow up with your Primary Care Provider.  If you are age 21 or younger, your body mass index should be between 19-25. Your Body mass index is 28.46 kg/m. If this is out of the aformentioned range listed, please consider follow up with your Primary Care Provider.    Your provider has requested that you go to the basement level for lab work before leaving today. Press "B" on the elevator. The lab is located at the first door on the left as you exit the elevator.  You have been scheduled for an abdominal ultrasound at [facility] (1st floor of hospital) on __________ at _________. Please arrive 15 minutes prior to your appointment for registration. Make certain not to have anything to eat or drink 6 hours prior to your appointment. Should you need to reschedule your appointment, please contact radiology at [phone]. This test typically takes about 30 minutes to perform.  Due to recent changes in healthcare laws, you may see the results of your imaging and laboratory studies on MyChart before your provider has had a chance to review them.  We understand that in some cases there may be results that are confusing or concerning to you. Not all laboratory results come back in the same time frame and the provider may be waiting for multiple results in order to interpret others.  Please give [place] 48 hours in order for your provider to thoroughly review all the results before contacting the office for clarification of  your results.   Thank you for choosing [facility]  Philbert Riser Nandigam,MD

## 2020-10-06 ENCOUNTER — Encounter: Payer: Self-pay | Admitting: Gastroenterology

## 2020-10-09 ENCOUNTER — Other Ambulatory Visit: Payer: Self-pay | Admitting: Internal Medicine

## 2020-10-09 DIAGNOSIS — E1165 Type 2 diabetes mellitus with hyperglycemia: Secondary | ICD-10-CM

## 2020-10-09 LAB — ALPHA-1-ANTITRYPSIN: A-1 Antitrypsin, Ser: 171 mg/dL (ref 83–199)

## 2020-10-09 LAB — HEPATITIS C ANTIBODY
Hepatitis C Ab: NONREACTIVE
SIGNAL TO CUT-OFF: 0.03 (ref <1.00)

## 2020-10-09 LAB — HEPATITIS A ANTIBODY, TOTAL: Hepatitis A AB,Total: REACTIVE — AB

## 2020-10-09 LAB — HEPATITIS B SURFACE ANTIBODY,QUALITATIVE: Hep B S Ab: NONREACTIVE

## 2020-10-09 LAB — HEPATITIS B SURFACE ANTIGEN: Hepatitis B Surface Ag: NONREACTIVE

## 2020-10-09 LAB — ANA: Anti Nuclear Antibody (ANA): POSITIVE — AB

## 2020-10-09 LAB — MITOCHONDRIAL ANTIBODIES: Mitochondrial M2 Ab, IgG: <=20 U

## 2020-10-09 LAB — ANTI-NUCLEAR AB-TITER (ANA TITER): ANA Titer 1: 1:320 {titer} — ABNORMAL HIGH

## 2020-10-09 LAB — ANTI-SMOOTH MUSCLE ANTIBODY, IGG: Actin (Smooth Muscle) Antibody (IGG): 20 U — ABNORMAL HIGH (ref <20)

## 2020-10-09 LAB — CERULOPLASMIN: Ceruloplasmin: 37 mg/dL (ref 18–53)

## 2020-10-09 NOTE — Telephone Encounter (Signed)
Medication: sitaGLIPtin-metformin (JANUMET) 50-1000 MG tablet [[id number]] ,   Has the patient contacted their pharmacy? YES  (Agent: If no, request that the patient contact the pharmacy for the refill.) (Agent: If yes, when and what did the pharmacy advise?)  Preferred Pharmacy (with phone number or street name): [facility] - [place], [place] - [place] [address] [place] [address] Phone: [phone] Fax: [phone] Hours: Not open 24 hours    Agent: Please be advised that RX refills may take up to 3 business days. We ask that you follow-up with your pharmacy.

## 2020-10-14 DIAGNOSIS — E119 Type 2 diabetes mellitus without complications: Secondary | ICD-10-CM | POA: Diagnosis not present

## 2020-10-14 DIAGNOSIS — M810 Age-related osteoporosis without current pathological fracture: Secondary | ICD-10-CM | POA: Diagnosis not present

## 2020-10-14 DIAGNOSIS — M1990 Unspecified osteoarthritis, unspecified site: Secondary | ICD-10-CM | POA: Diagnosis not present

## 2020-10-14 DIAGNOSIS — M25569 Pain in unspecified knee: Secondary | ICD-10-CM | POA: Diagnosis not present

## 2020-10-14 DIAGNOSIS — Z79899 Other long term (current) drug therapy: Secondary | ICD-10-CM | POA: Diagnosis not present

## 2020-10-14 DIAGNOSIS — M069 Rheumatoid arthritis, unspecified: Secondary | ICD-10-CM | POA: Diagnosis not present

## 2020-10-14 DIAGNOSIS — M25439 Effusion, unspecified wrist: Secondary | ICD-10-CM | POA: Diagnosis not present

## 2020-10-14 DIAGNOSIS — M79643 Pain in unspecified hand: Secondary | ICD-10-CM | POA: Diagnosis not present

## 2020-10-14 DIAGNOSIS — I10 Essential (primary) hypertension: Secondary | ICD-10-CM | POA: Diagnosis not present

## 2020-10-14 DIAGNOSIS — E785 Hyperlipidemia, unspecified: Secondary | ICD-10-CM | POA: Diagnosis not present

## 2020-10-15 ENCOUNTER — Other Ambulatory Visit: Payer: Self-pay

## 2020-10-15 ENCOUNTER — Ambulatory Visit: Payer: Medicare Other | Attending: Family | Admitting: Family

## 2020-10-15 ENCOUNTER — Encounter: Payer: Self-pay | Admitting: Family

## 2020-10-15 VITALS — BP 124/79 | HR 67 | Temp 97.1°F | Ht 64.0 in | Wt 163.2 lb

## 2020-10-15 DIAGNOSIS — M810 Age-related osteoporosis without current pathological fracture: Secondary | ICD-10-CM

## 2020-10-15 DIAGNOSIS — M0579 Rheumatoid arthritis with rheumatoid factor of multiple sites without organ or systems involvement: Secondary | ICD-10-CM

## 2020-10-15 DIAGNOSIS — Z789 Other specified health status: Secondary | ICD-10-CM | POA: Diagnosis not present

## 2020-10-15 DIAGNOSIS — E1165 Type 2 diabetes mellitus with hyperglycemia: Secondary | ICD-10-CM | POA: Diagnosis not present

## 2020-10-15 DIAGNOSIS — E663 Overweight: Secondary | ICD-10-CM | POA: Diagnosis not present

## 2020-10-15 MED ORDER — JANUMET 50-1000 MG PO TABS: 1.0000 | ORAL_TABLET | Freq: Two times a day (BID) | ORAL | 0 refills | Status: DC

## 2020-10-15 MED ORDER — PEN NEEDLES 31G X 5 MM MISC: 1.0000 | Freq: Every day | 0 refills | Status: DC

## 2020-10-15 MED ORDER — LEVEMIR FLEXTOUCH 100 UNIT/ML ~~LOC~~ SOPN: 8.0000 [IU] | PEN_INJECTOR | Freq: Every day | SUBCUTANEOUS | 0 refills | Status: DC

## 2020-10-15 MED FILL — TRUEPLUS 5-BEVEL PEN NEEDLE: 31G X 5 MM | 90 days supply | Qty: 100 | Fill #0

## 2020-10-15 NOTE — Addendum Note (Signed)
Addended by: Rema Fendt on: 10/15/2020 11:55 AM   Modules accepted: Orders

## 2020-10-15 NOTE — Progress Notes (Signed)
Patient ID: Natalie Cherry, female    DOB: 01-03-1961  MRN: [id number]  CC: Arm Swelling   Subjective: Natalie Cherry is a 59 y.o. female with history of type 2 diabetes mellitus with hyperglycemia without long-term current use of insulin, hyperlipidemia associated with type 2 diabetes mellitus, rheumatoid arthritis, and vitamin D deficiency who presents for  1. ARM SWELLING: Duration: 1 month has gotten worse Location: right hand, right foot, right arm Onset: chronic for at least 10 years Severity: has gotten worse in the last 1 month and painful (this is chronic) Aggravating factors: cold weather makes swelling and pain worse Status: fluctuating Swelling: yes Redness: no  Chest pain: sometimes on the left chest, comes and goes and of chronic origin Shortness of breath: no  Paresthesias: yes Weakness: yes  Reports she had appointment with Rheumatology on yesterday and Naproxen was discontinued and Meloxicam added with a follow-up in 1 month.  Patient Active Problem List   Diagnosis Date Noted  . Hyperlipidemia associated with type 2 diabetes mellitus ([place]) 07/24/2020  . Over weight 07/24/2020  . Influenza vaccination declined 07/24/2020  . Osteoporosis without current pathological fracture 12/07/2018  . Type 2 diabetes mellitus with hyperglycemia, without long-term current use of insulin (Natalie Cherry) 11/06/2015  . Rheumatoid arthritis (Natalie Cherry) 01/29/2014  . Vitamin D deficiency 01/29/2014  . DENTAL CARIES 03/02/2009  . Osteoarthritis of spine 10/05/2007     Current Outpatient Medications on File Prior to Visit  Medication Sig Dispense Refill  . Accu-Chek Softclix Lancets lancets Use as instructed to check blood sugar three times daily. E11.9 100 each 12  . Adalimumab 40 MG/0.8ML PNKT Inject 0.8 mLs into the skin every 14 (fourteen) days.     Marland Kitchen atorvastatin (LIPITOR) 20 MG tablet Take 1 tablet (20 mg total) by mouth daily. 30 tablet 11  . Blood Glucose Monitoring Suppl (ACCU-CHEK GUIDE)  w/Device KIT 1 each by Does not apply route daily. Use as instructed to check blood sugar three times daily. E11.9 1 kit 0  . denosumab (PROLIA) 60 MG/ML SOSY injection Inject 60 mg into the skin every 6 (six) months.    . diclofenac Sodium (VOLTAREN) 1 % GEL Apply 2 g topically 4 (four) times daily. 100 g 2  . glucose blood (ACCU-CHEK GUIDE) test strip Use as instructed to check blood sugar three times daily. E11.9 100 each 2  . hydroxychloroquine (PLAQUENIL) 200 MG tablet Take 200 mg by mouth daily.     . insulin detemir (LEVEMIR FLEXTOUCH) 100 UNIT/ML FlexPen Inject 8 Units into the skin daily. 15 mL 11  . Insulin Pen Needle (PEN NEEDLES) 31G X 5 MM MISC 1 Dose by Does not apply route daily. 100 each 0  . Methotrexate, PF, 25 MG/0.4ML SOAJ Inject 0.7 mLs into the skin once a week.     . sitaGLIPtin-metformin (JANUMET) 50-1000 MG tablet Take 1 tablet by mouth 2 (two) times daily with a meal. 180 tablet 1   No current facility-administered medications on file prior to visit.    No Known Allergies  Social History   Socioeconomic History  . Marital status: Married    Spouse name: Not on file  . Number of children: Not on file  . Years of education: Not on file  . Highest education level: Not on file  Occupational History  . Not on file  Tobacco Use  . Smoking status: Never Smoker  . Smokeless tobacco: Never Used  Vaping Use  . Vaping Use: Never used  Substance and Sexual Activity  . Alcohol use: No  . Drug use: No  . Sexual activity: Not Currently  Other Topics Concern  . Not on file  Social History Narrative  . Not on file   Social Determinants of Health   Financial Resource Strain:   . Difficulty of Paying Living Expenses: Not on file  Food Insecurity:   . Worried About Charity fundraiser in the Last Year: Not on file  . Ran Out of Food in the Last Year: Not on file  Transportation Needs:   . Lack of Transportation (Medical): Not on file  . Lack of Transportation  (Non-Medical): Not on file  Physical Activity:   . Days of Exercise per Week: Not on file  . Minutes of Exercise per Session: Not on file  Stress:   . Feeling of Stress : Not on file  Social Connections:   . Frequency of Communication with Friends and Family: Not on file  . Frequency of Social Gatherings with Friends and Family: Not on file  . Attends Religious Services: Not on file  . Active Member of Clubs or Organizations: Not on file  . Attends Archivist Meetings: Not on file  . Marital Status: Not on file  Intimate Partner Violence:   . Fear of Current or Ex-Partner: Not on file  . Emotionally Abused: Not on file  . Physically Abused: Not on file  . Sexually Abused: Not on file    Family History  Problem Relation Age of Onset  . Diabetes Mother   . Asthma Father   . Colon cancer Neg Hx   . Stomach cancer Neg Hx   . Esophageal cancer Neg Hx   . Pancreatic cancer Neg Hx   . Liver disease Neg Hx     Past Surgical History:  Procedure Laterality Date  . CHOLECYSTECTOMY     approx 6 yrs ago in [place]  . COLONOSCOPY     approx 15 years ago in [place]    ROS: Review of Systems Negative except as stated above  PHYSICAL EXAM: BP 124/79 (BP Location: Left Arm, Patient Position: Sitting, Cuff Size: Normal)   Pulse 67   Temp (!) 97.1 F (36.2 C) (Temporal)   Ht '5\' 4"'  (1.626 m)   Wt 163 lb 3.2 oz (74 kg)   SpO2 95%   BMI 28.01 kg/m    Wt Readings from Last 3 Encounters:  10/15/20 163 lb 3.2 oz (74 kg)  10/05/20 165 lb 12.8 oz (75.2 kg)  07/24/20 167 lb 3.2 oz (75.8 kg)   Physical Exam HENT:     Head: Normocephalic.     Right Ear: Tympanic membrane normal.     Left Ear: Tympanic membrane normal.  Cardiovascular:     Rate and Rhythm: Normal rate and regular rhythm.     Pulses: Normal pulses.     Heart sounds: Normal heart sounds.  Pulmonary:     Effort: Pulmonary effort is normal.     Breath sounds: Normal breath sounds.  Musculoskeletal:         General: Swelling and tenderness present.     Cervical back: Normal range of motion.     Right lower leg: Edema present.     Left lower leg: Edema present.     Comments: Bilateral wrist edema 1+, left ankle edema 1+, right ankle edema 2+, right lower extremity edema 1+  Skin:    General: Skin is warm and dry.  Neurological:  General: No focal deficit present.     Mental Status: She is alert and oriented to person, place, and time.  Psychiatric:        Mood and Affect: Mood normal.        Behavior: Behavior normal.    ASSESSMENT AND PLAN: 1. Rheumatoid arthritis involving multiple sites with positive rheumatoid factor ([place]): - Followed by Rheumatology. On Humira, Methotrexate, and folic acid.  - Reports she had appointment with Rheumatology on yesterday and Naproxen was discontinued and Meloxicam was added. Follow-up appointment in 1 month - Keep all appointments with Rheumatology. - Follow-up with primary physician as needed.  2. Osteoporosis without current pathological fracture, unspecified osteoporosis type: - Followed by Rheumatology.  - On Prolia through her rheumatologist.  - Keep all appointments with Rheumatology. - Follow-up with primary physician as needed.   3. Over weight:  -Counseled on low-sodium, DASH diet, and exercise as tolerated.  4. Language barrier: - [employer] participated during today's visit. Interpreter Name: Lonna Duval, ID#: [id number]  Patient was given the opportunity to ask questions.  Patient verbalized understanding of the plan and was able to repeat key elements of the plan. Patient was given clear instructions to go to Emergency Department or return to medical center if symptoms don't improve, worsen, or new problems develop.The patient verbalized understanding.   No orders of the defined types were placed in this encounter.    Requested Prescriptions    No prescriptions requested or ordered in this encounter    Return in about 4  weeks (around 11/12/2020) for Dr. Wynetta Emery.  Camillia Herter, NP

## 2020-10-15 NOTE — Patient Instructions (Addendum)
Keep appointments with Rheumatology.   Follow-up with primary physician as needed.   Artritis Arthritis La palabra "artritis" significa tener dolor en las articulaciones. Tambin puede referirse a una enfermedad de las articulaciones. Una articulacin es Immunologist en el que se juntan los [place]. Hay ms de 100 tipos de artritis. Cules son las causas? Esta afeccin puede ser causada por lo siguiente:  Desgaste de una articulacin. Esta es la causa ms frecuente.  Cantidad elevada de cido en la sangre, que provoca dolor en la articulacin (gota).  Dolor e hinchazn (inflamacin) en la articulacin.  Infeccin en una articulacin.  Lesiones en una articulacin.  Neomia Dear reaccin a medicamentos (alergia). En algunos casos, es posible que la causa se desconozca. Cules son los signos o los sntomas? Los sntomas de esta afeccin incluyen:  Enrojecimiento de Risk analyst.  Hinchazn en una articulacin.  Rigidez en una articulacin.  Calor que proviene de Nurse, learning disability.  Grant Ruts.  Sensacin de estar enfermo. Cmo se trata? El tratamiento de esta afeccin puede incluir:  Dar tratamiento a la causa si se la conoce.  Reposo.  Levantamiento (elevacin) de la articulacin.  Aplicar compresas fras o calientes sobre Nurse, learning disability.  Medicamentos para tratar los sntomas y reducir Chief Technology Officer y la hinchazn.  Inyecciones de medicamentos (cortisona) en la articulacin. Tambin pueden indicarle que haga cambios en su vida, como hacer ejercicio y Publishing copy de [place]. Siga estas instrucciones en su casa: Medicamentos  [employer] de venta libre y los recetados solamente como se lo haya indicado el mdico.  No tome aspirina para el dolor si el mdico le dice que puede tener gota. Actividad  Ponga la articulacin en reposo si el mdico se lo indica.  Evite las actividades que [employer].  Haga ejercicios para la articulacin con regularidad tal como se lo  indic el mdico. Pruebe hacer ejercicios como: ? Natacin. ? Gimnasia acutica. ? Andar en bicicleta. ? Caminar. Control del dolor, la rigidez y la hinchazn      Si se lo indican, aplique hielo en la zona afectada. ? Ponga el hielo en una bolsa plstica. ? Coloque una [employer] piel y Copy. ? Coloque el hielo durante , 2a3veces al da.  Si la articulacin est hinchada, levntela (elvela) por encima del nivel del corazn como se lo haya indicado el mdico.  Si tiene la articulacin rgida por la maana, pruebe tomar una ducha tibia.  Si se lo indican, aplique calor en la zona afectada. Hgalo con la frecuencia que le haya indicado el mdico. Use la fuente de calor que el mdico le recomiende, como una compresa de calor hmedo o una almohadilla trmica. Si tiene diabetes, no aplique calor sin consultar a su mdico. Para aplicar calor: ? Coloque una toalla entre la piel y la fuente de Airline pilot. ? Aplique calor durante 20 a . ? Retire la fuente de calor si la piel se pone de color rojo brillante. Esto es muy importante si no puede Financial risk analyst, calor o fro. Puede correr un riesgo mayor de sufrir quemaduras. Indicaciones generales  No consuma ningn producto que contenga nicotina o tabaco, como cigarrillos, cigarrillos electrnicos y tabaco de Theatre manager. Si necesita ayuda para dejar de fumar, consulte al mdico.  Concurra a todas las visitas de seguimiento como se lo haya indicado el mdico. Esto es importante. Comunquese con un mdico si:  El Product/process development scientist.  Tiene fiebre. Solicite ayuda inmediatamente si:  Tiene un dolor muy intenso en  la articulacin.  Tiene hinchazn en la articulacin.  La articulacin est roja.  Comienza a tener hinchazn y Tax adviser.  Tiene un dolor muy intenso en la espalda.  Tiene la pierna muy dbil.  No puede controlar las evacuaciones de pis (orina) o materia fecal (heces). Resumen  La  palabra "artritis" significa tener dolor en las articulaciones. Tambin puede referirse a una enfermedad de las articulaciones. Una articulacin es Immunologist en el que se juntan los [place].  La causa ms frecuente de esta afeccin es el desgaste de una articulacin.  Los principales sntomas de esta afeccin son enrojecimiento, hinchazn o Geophysical data processor.  Esta afeccin se trata con reposo, elevacin de la articulacin, medicamentos y colocando compresas fras o calientes sobre la articulacin.  Siga las indicaciones del mdico acerca de los medicamentos, la [place], los ejercicios y otros tratamientos para el cuidado Facilities manager. Esta informacin no tiene Theme park manager el consejo del mdico. Asegrese de hacerle al mdico cualquier pregunta que tenga. Document Revised: 12/03/2018 Document Reviewed: 12/03/2018 Elsevier Patient Education  2020 [employer].

## 2020-10-19 MED FILL — JANUMET 50-1,000 MG TABLET: 50-1000 | 90 days supply | Qty: 180 | Fill #1

## 2020-10-29 MED FILL — ATORVASTATIN CALCIUM 20 MG: 20 | 90 days supply | Qty: 90 | Fill #2

## 2020-10-29 MED FILL — JANUMET 50-1,000 MG TABLET: 50-1000 | 90 days supply | Qty: 180 | Fill #1

## 2020-11-03 ENCOUNTER — Telehealth: Payer: Self-pay | Admitting: Gastroenterology

## 2020-11-03 NOTE — Telephone Encounter (Signed)
Good afternoon Dr. Lavon Paganini, patient called to cancel their procedure; stated she was exposed to COVID.  Will call back to reschedule procedure.  Thank you.

## 2020-11-04 ENCOUNTER — Encounter: Payer: Medicare Other | Admitting: Gastroenterology

## 2020-11-04 DIAGNOSIS — M069 Rheumatoid arthritis, unspecified: Secondary | ICD-10-CM | POA: Diagnosis not present

## 2020-11-04 DIAGNOSIS — M25439 Effusion, unspecified wrist: Secondary | ICD-10-CM | POA: Diagnosis not present

## 2020-11-04 DIAGNOSIS — E119 Type 2 diabetes mellitus without complications: Secondary | ICD-10-CM | POA: Diagnosis not present

## 2020-11-04 DIAGNOSIS — M810 Age-related osteoporosis without current pathological fracture: Secondary | ICD-10-CM | POA: Diagnosis not present

## 2020-11-04 DIAGNOSIS — I10 Essential (primary) hypertension: Secondary | ICD-10-CM | POA: Diagnosis not present

## 2020-11-04 DIAGNOSIS — E785 Hyperlipidemia, unspecified: Secondary | ICD-10-CM | POA: Diagnosis not present

## 2020-11-04 DIAGNOSIS — Z79899 Other long term (current) drug therapy: Secondary | ICD-10-CM | POA: Diagnosis not present

## 2020-11-04 DIAGNOSIS — M79643 Pain in unspecified hand: Secondary | ICD-10-CM | POA: Diagnosis not present

## 2020-11-04 DIAGNOSIS — M1990 Unspecified osteoarthritis, unspecified site: Secondary | ICD-10-CM | POA: Diagnosis not present

## 2020-11-04 NOTE — Telephone Encounter (Signed)
Ok

## 2020-11-10 ENCOUNTER — Ambulatory Visit (HOSPITAL_COMMUNITY)
Admission: EM | Admit: 2020-11-10 | Discharge: 2020-11-10 | Disposition: A | Discharge status: Home / Self Care | Payer: Medicare Other | Attending: Urgent Care | Admitting: Urgent Care

## 2020-11-10 ENCOUNTER — Ambulatory Visit: Payer: Self-pay

## 2020-11-10 ENCOUNTER — Encounter (HOSPITAL_COMMUNITY): Payer: Self-pay | Admitting: Emergency Medicine

## 2020-11-10 ENCOUNTER — Ambulatory Visit (INDEPENDENT_AMBULATORY_CARE_PROVIDER_SITE_OTHER): Payer: Medicare Other

## 2020-11-10 ENCOUNTER — Other Ambulatory Visit: Payer: Self-pay

## 2020-11-10 DIAGNOSIS — M79601 Pain in right arm: Secondary | ICD-10-CM

## 2020-11-10 DIAGNOSIS — E119 Type 2 diabetes mellitus without complications: Secondary | ICD-10-CM

## 2020-11-10 DIAGNOSIS — S60221A Contusion of right hand, initial encounter: Secondary | ICD-10-CM | POA: Diagnosis not present

## 2020-11-10 DIAGNOSIS — R0789 Other chest pain: Secondary | ICD-10-CM | POA: Diagnosis not present

## 2020-11-10 DIAGNOSIS — R2231 Localized swelling, mass and lump, right upper limb: Secondary | ICD-10-CM | POA: Diagnosis not present

## 2020-11-10 DIAGNOSIS — M79641 Pain in right hand: Secondary | ICD-10-CM

## 2020-11-10 DIAGNOSIS — W19XXXA Unspecified fall, initial encounter: Secondary | ICD-10-CM

## 2020-11-10 DIAGNOSIS — Z20822 Contact with and (suspected) exposure to covid-19: Secondary | ICD-10-CM | POA: Diagnosis not present

## 2020-11-10 DIAGNOSIS — M542 Cervicalgia: Secondary | ICD-10-CM | POA: Diagnosis not present

## 2020-11-10 DIAGNOSIS — J069 Acute upper respiratory infection, unspecified: Secondary | ICD-10-CM

## 2020-11-10 MED ORDER — PROMETHAZINE-DM 6.25-15 MG/5ML PO SYRP: 5.0000 mL | ORAL_SOLUTION | Freq: Every evening | ORAL | 0 refills | Status: DC | PRN

## 2020-11-10 MED ORDER — CETIRIZINE HCL 10 MG PO TABS: 10.0000 mg | ORAL_TABLET | Freq: Every day | ORAL | 0 refills | Status: DC

## 2020-11-10 MED ORDER — PSEUDOEPHEDRINE HCL 30 MG PO TABS: 30.0000 mg | ORAL_TABLET | Freq: Three times a day (TID) | ORAL | 0 refills | Status: DC | PRN

## 2020-11-10 MED ORDER — METHYLPREDNISOLONE ACETATE 80 MG/ML IJ SUSP
INTRAMUSCULAR | Status: AC
  Filled 2020-11-10: qty 1

## 2020-11-10 MED ORDER — BENZONATATE 100 MG PO CAPS: 100.0000 mg | ORAL_CAPSULE | Freq: Three times a day (TID) | ORAL | 0 refills | Status: DC | PRN

## 2020-11-10 MED ORDER — METHYLPREDNISOLONE ACETATE 80 MG/ML IJ SUSP
80.0000 mg | Freq: Once | INTRAMUSCULAR | Status: AC
  Administered 2020-11-10: 80 mg via INTRAMUSCULAR

## 2020-11-10 NOTE — ED Triage Notes (Signed)
Patient fell on right side and right arm hurts.  This occurred on Saturday.  Patient reports pain from right neck to hand, right hand is swollen  On Sunday started having chest soreness and coughing, fever-102

## 2020-11-10 NOTE — Telephone Encounter (Signed)
Pt's daughter called to report pt is having right side chest pain with coughing. Pt h/o febrile and cough. Of note, pt fell this weekend on her right side and has some right arm discomfort and swelling. Pt denies SOB, radiating chest pain.  Advised daughter to take pt to [facility] for evaluation. No xray available at practice. Care advice given and daughter verbalized understanding.    Reason for Disposition . [1] Chest pain lasting < 5 minutes AND [2] NO chest pain or cardiac symptoms (e.g., breathing difficulty, sweating) now (Exception: chest pains that last only a few seconds)  Answer Assessment - Initial Assessment Questions 1. LOCATION: "Where does it hurt?"       Right of chest  2. RADIATION: "Does the pain go anywhere else?" (e.g., into neck, jaw, arms, back)      Chest only 3. ONSET: "When did the chest pain begin?" (Minutes, hours or days)      today 4. PATTERN "Does the pain come and go, or has it been constant since it started?"  "Does it get worse with exertion?"      With coughing only 5. DURATION: "How long does it last" (e.g., seconds, minutes, hours)     With coughing 6. SEVERITY: "How bad is the pain?"  (e.g., Scale 1-10; mild, moderate, or severe)    - MILD (1-3): doesn't interfere with normal activities     - MODERATE (4-7): interferes with normal activities or awakens from sleep    - SEVERE (8-10): excruciating pain, unable to do any normal activities       Moderate 5/10 7. CARDIAC RISK FACTORS: "Do you have any history of heart problems or risk factors for heart disease?" (e.g., angina, prior heart attack; diabetes, high blood pressure, high cholesterol, smoker, or strong family history of heart disease)     Diabetes 8. PULMONARY RISK FACTORS: "Do you have any history of lung disease?"  (e.g., blood clots in lung, asthma, emphysema, birth control pills)     no 9. CAUSE: "What do you think is causing the chest pain?"     Bronchitis or pneumonia 10. OTHER SYMPTOMS: "Do you  have any other symptoms?" (e.g., dizziness, nausea, vomiting, sweating, fever, difficulty breathing, cough)       Cough- fell on her right side (this weekend), fever yesterday 102 11. PREGNANCY: "Is there any chance you are pregnant?" "When was your last menstrual period?"      n/a  Protocols used: CHEST PAIN-A-AH

## 2020-11-10 NOTE — ED Provider Notes (Signed)
[place]   MRN: [id number] DOB: 1961/08/05  Subjective:   Natalie Cherry is a 59 y.o. female presenting for 3 day history of persistent right arm pain worst over her hand. Patient accidentally fell and landed on her right hand. Has since had swelling, developed neck pain as well. Has a history of RA but is not responding to APAP and ibuprofen. Reports that she usually does better with steroids. Also has 2 day history of acute onset runny and stuffy nose, dry hacking cough that elicits chest pain and shob. Denies history of lung disorders. She is not a smoker. Has well controlled diabetes, reports her home cbg was 116. Never gets higher than 160.   No current facility-administered medications for this encounter.  Current Outpatient Medications:    Accu-Chek Softclix Lancets lancets, Use as instructed to check blood sugar three times daily. E11.9, Disp: 100 each, Rfl: 12   Adalimumab 40 MG/0.8ML PNKT, Inject 0.8 mLs into the skin every 14 (fourteen) days. , Disp: , Rfl:    atorvastatin (LIPITOR) 20 MG tablet, Take 1 tablet (20 mg total) by mouth daily., Disp: 30 tablet, Rfl: 11   Blood Glucose Monitoring Suppl (ACCU-CHEK GUIDE) w/Device KIT, 1 each by Does not apply route daily. Use as instructed to check blood sugar three times daily. E11.9, Disp: 1 kit, Rfl: 0   denosumab (PROLIA) 60 MG/ML SOSY injection, Inject 60 mg into the skin every 6 (six) months., Disp: , Rfl:    diclofenac Sodium (VOLTAREN) 1 % GEL, Apply 2 g topically 4 (four) times daily., Disp: 100 g, Rfl: 2   glucose blood (ACCU-CHEK GUIDE) test strip, Use as instructed to check blood sugar three times daily. E11.9, Disp: 100 each, Rfl: 2   hydroxychloroquine (PLAQUENIL) 200 MG tablet, Take 200 mg by mouth daily. , Disp: , Rfl:    insulin detemir (LEVEMIR FLEXTOUCH) 100 UNIT/ML FlexPen, Inject 8 Units into the skin daily., Disp: 15 mL, Rfl: 0   Insulin Pen Needle (PEN NEEDLES) 31G X 5 MM MISC, 1 Dose by  Does not apply route daily., Disp: 100 each, Rfl: 0   Methotrexate, PF, 25 MG/0.4ML SOAJ, Inject 0.7 mLs into the skin once a week. , Disp: , Rfl:    sitaGLIPtin-metformin (JANUMET) 50-1000 MG tablet, Take 1 tablet by mouth 2 (two) times daily with a meal., Disp: 60 tablet, Rfl: 0   No Known Allergies  Past Medical History:  Diagnosis Date   Arthritis    Diabetes mellitus    Hypertension      Past Surgical History:  Procedure Laterality Date   CHOLECYSTECTOMY     approx 6 yrs ago in [place]   COLONOSCOPY     approx 15 years ago in [place]    Family History  Problem Relation Age of Onset   Diabetes Mother    Asthma Father    Colon cancer Neg Hx    Stomach cancer Neg Hx    Esophageal cancer Neg Hx    Pancreatic cancer Neg Hx    Liver disease Neg Hx     Social History   Tobacco Use   Smoking status: Never Smoker   Smokeless tobacco: Never Used  Vaping Use   Vaping Use: Never used  Substance Use Topics   Alcohol use: No   Drug use: No    ROS   Objective:   Vitals: BP 122/75 (BP Location: Left Arm)    Pulse 95    Temp 98.6 F (  37 C) (Oral)    Resp 20    SpO2 98%   Physical Exam Constitutional:      General: She is not in acute distress.    Appearance: Normal appearance. She is well-developed. She is not ill-appearing, toxic-appearing or diaphoretic.  HENT:     Head: Normocephalic and atraumatic.     Nose: Congestion and rhinorrhea present.     Mouth/Throat:     Mouth: Mucous membranes are moist.  Eyes:     Extraocular Movements: Extraocular movements intact.     Pupils: Pupils are equal, round, and reactive to light.  Cardiovascular:     Rate and Rhythm: Normal rate and regular rhythm.     Pulses: Normal pulses.     Heart sounds: Normal heart sounds. No murmur heard. No friction rub. No gallop.   Pulmonary:     Effort: Pulmonary effort is normal. No respiratory distress.     Breath sounds: Normal breath sounds. No stridor. No wheezing,  rhonchi or rales.  Musculoskeletal:     Comments: Right hand swelling 1+ over radial dorsal aspect with slight ecchymosis. Ulnar deviation of right 2nd-5th fingers, swan neck deformity of same fingers. Near full ROM. No forearm, elbow or shoulder tenderness. Slight paraspinal muscle tenderness at cervical level. Full ROM at cervical level. Negative Spurling, Lhermitte signs.  Skin:    General: Skin is warm and dry.     Findings: No rash.  Neurological:     Mental Status: She is alert and oriented to person, place, and time.     Cranial Nerves: No cranial nerve deficit.     Motor: No weakness.  Psychiatric:        Mood and Affect: Mood normal.        Behavior: Behavior normal.        Thought Content: Thought content normal.     DG Hand Complete Right  Result Date: 11/10/2020 CLINICAL DATA:  Right hand pain, swelling EXAM: RIGHT HAND - COMPLETE 3+ VIEW COMPARISON:  04/26/2011 FINDINGS: Advanced arthritis changes within the IP joints and wrist. Areas of bone erosion noted about the 1st carpometacarpal joint as well as at the 2nd and 3rd PIP joints and at the ulnar styloid. No acute bony abnormality. Specifically, no fracture, subluxation, or dislocation. IMPRESSION: Erosive arthropathy throughout the hand and wrist.  Consider gout. No acute bony abnormality. Electronically Signed   By: Rolm Baptise M.D.   On: 11/10/2020 20:09     Assessment and Plan :   PDMP not reviewed this encounter.  1. Viral URI with cough   2. Atypical chest pain   3. Right hand pain   4. Fall, initial encounter   5. Right arm pain   6. Neck pain   7. Well controlled diabetes mellitus ([place])   8. Contusion of right hand, initial encounter     Will manage for viral illness such as viral URI, viral syndrome, viral rhinitis, COVID-19. Counseled patient on nature of COVID-19 including modes of transmission, diagnostic testing, management and supportive care.  Offered scripts for symptomatic relief. COVID 19  testing is pending. Will steroid course for her musculoskeletal pain in the context of her RA and lack of response to conservative management with APAP and ibuprofen. Counseled patient on potential for adverse effects with medications prescribed/recommended today, ER and return-to-clinic precautions discussed, patient verbalized understanding.     Jaynee Eagles, PA-C 11/11/20 1121

## 2020-11-10 NOTE — Telephone Encounter (Signed)
Will forward to pcp

## 2020-11-11 ENCOUNTER — Ambulatory Visit: Payer: Medicare Other | Admitting: Physician Assistant

## 2020-11-11 LAB — SARS CORONAVIRUS 2 (TAT 6-24 HRS): SARS Coronavirus 2: NEGATIVE

## 2021-03-10 ENCOUNTER — Other Ambulatory Visit: Payer: Self-pay | Admitting: Internal Medicine

## 2021-03-10 ENCOUNTER — Other Ambulatory Visit: Payer: Self-pay

## 2021-03-10 ENCOUNTER — Telehealth: Payer: Self-pay | Admitting: Internal Medicine

## 2021-03-10 DIAGNOSIS — E1165 Type 2 diabetes mellitus with hyperglycemia: Secondary | ICD-10-CM

## 2021-03-10 NOTE — Telephone Encounter (Signed)
Copied from CRM [id number]. Topic: Quick Communication - Rx Refill/Question >> Mar 10, 2021  4:23 PM Marylen Ponto wrote: Medication: sitaGLIPtin-metformin (JANUMET) 50-1000 MG tablet  Has the patient contacted their pharmacy? yes - Pt told she needs appt but has an appt for 04-29-21  Preferred Pharmacy (with phone number or street name): [facility]  Phone: [phone]   Fax: [phone]  Agent: Please be advised that RX refills may take up to 3 business days. We ask that you follow-up with your pharmacy.

## 2021-03-10 NOTE — Telephone Encounter (Signed)
Patient would like refill until appointment on 04/29/2021. Please advise

## 2021-03-10 NOTE — Telephone Encounter (Signed)
Requested medication (s) are due for refill today: yes  Requested medication (s) are on the active medication list: yes  Future visit scheduled: no  Notes to clinic: Review for  change in medication requested by pharmacy    Requested Prescriptions  Pending Prescriptions Disp Refills   sitaGLIPtin-metformin (JANUMET) 50-1000 MG tablet 180 tablet 1    Sig: TAKE 1 TABLET BY MOUTH 2 (TWO) TIMES DAILY WITH A MEAL.      Endocrinology:  Diabetes - Biguanide + DPP-4 Inhibitor Combos Failed - 03/10/2021 12:27 PM      Failed - HBA1C is between 0 and 7.9 and within 180 days    No results found for: HGBA1C, LABA1C        Failed - Cr in normal range and within 360 days    No results found for: CREATININE, LABCREAU, LABCREA, POCCRE        Failed - eGFR in normal range and within 360 days    No results found for: GFRAA, GFRNONAA, GFR, EGFR        Failed - Valid encounter within last 6 months    Recent Outpatient Visits   None

## 2021-03-11 ENCOUNTER — Other Ambulatory Visit: Payer: Self-pay

## 2021-03-11 MED ORDER — JANUMET 50-1000 MG PO TABS
1.0000 | ORAL_TABLET | Freq: Two times a day (BID) | ORAL | 2 refills | Status: DC
  Filled 2021-03-11: qty 60, 30d supply, fill #0

## 2021-03-11 NOTE — Telephone Encounter (Signed)
Rx sent 

## 2021-03-16 ENCOUNTER — Other Ambulatory Visit: Payer: Self-pay

## 2021-03-16 DIAGNOSIS — M79643 Pain in unspecified hand: Secondary | ICD-10-CM | POA: Diagnosis not present

## 2021-03-16 DIAGNOSIS — M1990 Unspecified osteoarthritis, unspecified site: Secondary | ICD-10-CM | POA: Diagnosis not present

## 2021-03-16 DIAGNOSIS — I10 Essential (primary) hypertension: Secondary | ICD-10-CM | POA: Diagnosis not present

## 2021-03-16 DIAGNOSIS — M25439 Effusion, unspecified wrist: Secondary | ICD-10-CM | POA: Diagnosis not present

## 2021-03-16 DIAGNOSIS — E119 Type 2 diabetes mellitus without complications: Secondary | ICD-10-CM | POA: Diagnosis not present

## 2021-03-16 DIAGNOSIS — M810 Age-related osteoporosis without current pathological fracture: Secondary | ICD-10-CM | POA: Diagnosis not present

## 2021-03-16 DIAGNOSIS — Z79899 Other long term (current) drug therapy: Secondary | ICD-10-CM | POA: Diagnosis not present

## 2021-03-16 DIAGNOSIS — M069 Rheumatoid arthritis, unspecified: Secondary | ICD-10-CM | POA: Diagnosis not present

## 2021-03-16 DIAGNOSIS — E785 Hyperlipidemia, unspecified: Secondary | ICD-10-CM | POA: Diagnosis not present

## 2021-03-17 ENCOUNTER — Other Ambulatory Visit: Payer: Self-pay

## 2021-03-24 ENCOUNTER — Other Ambulatory Visit: Payer: Self-pay

## 2021-03-31 ENCOUNTER — Encounter: Payer: Self-pay | Admitting: Internal Medicine

## 2021-03-31 NOTE — Progress Notes (Signed)
Received note from patient's rheumatologist Dr.Syed.  Patient seen 03/16/2021. With rheumatoid arthritis seropositive.  Patient continued on prednisone 5 mg daily.  Patient on Enbrel, cetraxate 2.5 mg 5 tablets once a week, Mobic 15 mg daily. Labs: H/H 12.1/37.3, WBC 10.6 BUN 18/creatinine 0.81.  GFR 83 C-reactive protein 12, sed rate 28 LFTs normal

## 2021-04-29 ENCOUNTER — Encounter: Payer: Self-pay | Admitting: Internal Medicine

## 2021-04-29 ENCOUNTER — Other Ambulatory Visit: Payer: Self-pay

## 2021-04-29 ENCOUNTER — Ambulatory Visit: Payer: Medicare Other | Attending: Internal Medicine | Admitting: Internal Medicine

## 2021-04-29 VITALS — BP 117/73 | HR 75 | Resp 16 | Wt 167.2 lb

## 2021-04-29 DIAGNOSIS — Z79899 Other long term (current) drug therapy: Secondary | ICD-10-CM | POA: Diagnosis not present

## 2021-04-29 DIAGNOSIS — Z794 Long term (current) use of insulin: Secondary | ICD-10-CM | POA: Diagnosis not present

## 2021-04-29 DIAGNOSIS — Z833 Family history of diabetes mellitus: Secondary | ICD-10-CM | POA: Diagnosis not present

## 2021-04-29 DIAGNOSIS — Z1211 Encounter for screening for malignant neoplasm of colon: Secondary | ICD-10-CM

## 2021-04-29 DIAGNOSIS — M810 Age-related osteoporosis without current pathological fracture: Secondary | ICD-10-CM | POA: Diagnosis not present

## 2021-04-29 DIAGNOSIS — E785 Hyperlipidemia, unspecified: Secondary | ICD-10-CM | POA: Insufficient documentation

## 2021-04-29 DIAGNOSIS — R1013 Epigastric pain: Secondary | ICD-10-CM | POA: Insufficient documentation

## 2021-04-29 DIAGNOSIS — Z8616 Personal history of COVID-19: Secondary | ICD-10-CM | POA: Insufficient documentation

## 2021-04-29 DIAGNOSIS — M0579 Rheumatoid arthritis with rheumatoid factor of multiple sites without organ or systems involvement: Secondary | ICD-10-CM | POA: Insufficient documentation

## 2021-04-29 DIAGNOSIS — E1165 Type 2 diabetes mellitus with hyperglycemia: Secondary | ICD-10-CM | POA: Diagnosis not present

## 2021-04-29 DIAGNOSIS — Z7984 Long term (current) use of oral hypoglycemic drugs: Secondary | ICD-10-CM | POA: Insufficient documentation

## 2021-04-29 DIAGNOSIS — Z1231 Encounter for screening mammogram for malignant neoplasm of breast: Secondary | ICD-10-CM

## 2021-04-29 DIAGNOSIS — E1169 Type 2 diabetes mellitus with other specified complication: Secondary | ICD-10-CM | POA: Diagnosis not present

## 2021-04-29 LAB — POCT GLYCOSYLATED HEMOGLOBIN (HGB A1C): HbA1c, POC (controlled diabetic range): 6.7 % (ref 0.0–7.0)

## 2021-04-29 LAB — GLUCOSE, POCT (MANUAL RESULT ENTRY): POC Glucose: 88 mg/dl (ref 70–99)

## 2021-04-29 MED ORDER — ATORVASTATIN CALCIUM 20 MG PO TABS
20.0000 mg | ORAL_TABLET | Freq: Every day | ORAL | 11 refills | Status: DC
  Filled 2021-04-29: qty 30, 30d supply, fill #0
  Filled 2021-06-10: qty 30, 30d supply, fill #1
  Filled 2021-07-15: qty 90, 90d supply, fill #1
  Filled 2022-01-11: qty 90, 90d supply, fill #0

## 2021-04-29 MED ORDER — JANUMET 50-1000 MG PO TABS
1.0000 | ORAL_TABLET | Freq: Two times a day (BID) | ORAL | 2 refills | Status: DC
  Filled 2021-04-29: qty 60, 30d supply, fill #0
  Filled 2021-06-04: qty 120, 60d supply, fill #1

## 2021-04-29 MED ORDER — FAMOTIDINE 20 MG PO TABS
20.0000 mg | ORAL_TABLET | Freq: Two times a day (BID) | ORAL | 3 refills | Status: DC
  Filled 2021-04-29: qty 60, 30d supply, fill #0
  Filled 2021-06-10: qty 60, 30d supply, fill #1
  Filled 2021-07-15: qty 180, 90d supply, fill #1

## 2021-04-29 NOTE — Addendum Note (Signed)
Addended by: Jonah Blue B on: 04/29/2021 05:47 PM   Modules accepted: Orders

## 2021-04-29 NOTE — Progress Notes (Signed)
Patient ID: Natalie Cherry, female    DOB: 03-17-1961  MRN: [id number]  CC: Diabetes   Subjective: Natalie Cherry is a 60 y.o. female who presents for chronic disease management Her concerns today include:  Pt with hx of DM, HL, RA,osteoporosis on Prolia  RA: She continues to be followed by her rheumatologist Dr. Dossie Der.  She is on Enbrel and methotrexate.  Doing well on therapy.  DM: Results for orders placed or performed in visit on 04/29/21  POCT glucose (manual entry)  Result Value Ref Range   POC Glucose 88 70 - 99 mg/dl  POCT glycosylated hemoglobin (Hb A1C)  Result Value Ref Range   Hemoglobin A1C     HbA1c POC (<> result, manual entry)     HbA1c, POC (prediabetic range)     HbA1c, POC (controlled diabetic range) 6.7 0.0 - 7.0 %  -on Janumet BID. Has not taken Levemir for 3 mths because BS have been good.  Checks BS Q 2 days. Highest was 170.  Walks 15 mins Q 3 days.  Doing well with her eating habits  HL:  Taking and tolerating Lipitor.  Had mild elev in ALT and Alk phos 06/2020 for which she was seen by gastroenterology Dr. Silverio Decamp.  Antimitochondrial antibody was negative.  Smooth muscle antibody was positive.  ANA was positive.  Ceruloplasmin normal.  Hepatitis A antibody was positive.  Plan was for colonoscopy and EGD but patient subsequently canceled when she developed COVID.  She has not been back for follow-up.  Complains of fatigue, nausea x 3-4 days No stomach pain Diarrhea 5 days ago after eating spicy foods   Patient Active Problem List   Diagnosis Date Noted  . Hyperlipidemia associated with type 2 diabetes mellitus ([place]) 07/24/2020  . Over weight 07/24/2020  . Influenza vaccination declined 07/24/2020  . Osteoporosis without current pathological fracture 12/07/2018  . Type 2 diabetes mellitus with hyperglycemia, without long-term current use of insulin ([place]) 11/06/2015  . Rheumatoid arthritis ([place]) 01/29/2014  . Vitamin D deficiency 01/29/2014  . DENTAL  CARIES 03/02/2009  . Osteoarthritis of spine 10/05/2007     Current Outpatient Medications on File Prior to Visit  Medication Sig Dispense Refill  . Accu-Chek Softclix Lancets lancets Use as instructed to check blood sugar three times daily. E11.9 100 each 12  . Adalimumab 40 MG/0.8ML PNKT Inject 0.8 mLs into the skin every 14 (fourteen) days.     Marland Kitchen atorvastatin (LIPITOR) 20 MG tablet Take 1 tablet (20 mg total) by mouth daily. 30 tablet 11  . benzonatate (TESSALON) 100 MG capsule Take 1-2 capsules (100-200 mg total) by mouth 3 (three) times daily as needed. (Patient not taking: Reported on 04/29/2021) 60 capsule 0  . Blood Glucose Monitoring Suppl (ACCU-CHEK GUIDE) w/Device KIT 1 each by Does not apply route daily. Use as instructed to check blood sugar three times daily. E11.9 1 kit 0  . cetirizine (ZYRTEC ALLERGY) 10 MG tablet Take 1 tablet (10 mg total) by mouth daily. 30 tablet 0  . denosumab (PROLIA) 60 MG/ML SOSY injection Inject 60 mg into the skin every 6 (six) months.    . diclofenac Sodium (VOLTAREN) 1 % GEL Apply 2 g topically 4 (four) times daily. 100 g 2  . diphenhydrAMINE (BENADRYL) 50 MG capsule Take 50 mg by mouth every 6 (six) hours as needed.    Marland Kitchen glucose blood (ACCU-CHEK GUIDE) test strip Use as instructed to check blood sugar three times daily. E11.9 100 each 2  .  hydroxychloroquine (PLAQUENIL) 200 MG tablet Take 200 mg by mouth daily.     Marland Kitchen ibuprofen (ADVIL) 200 MG tablet Take 200 mg by mouth every 6 (six) hours as needed.    . insulin detemir (LEVEMIR FLEXTOUCH) 100 UNIT/ML FlexPen Inject 8 Units into the skin daily. 15 mL 0  . Insulin Pen Needle (PEN NEEDLES) 31G X 5 MM MISC 1 Dose by Does not apply route daily. 100 each 0  . Methotrexate, PF, 25 MG/0.4ML SOAJ Inject 0.7 mLs into the skin once a week.     . promethazine-dextromethorphan (PROMETHAZINE-DM) 6.25-15 MG/5ML syrup Take 5 mLs by mouth at bedtime as needed for cough. 100 mL 0  . pseudoephedrine (SUDAFED) 30 MG  tablet Take 1 tablet (30 mg total) by mouth every 8 (eight) hours as needed for congestion. 30 tablet 0  . sitaGLIPtin-metformin (JANUMET) 50-1000 MG tablet Take 1 tablet by mouth 2 (two) times daily with a meal. 60 tablet 2   No current facility-administered medications on file prior to visit.    No Known Allergies  Social History   Socioeconomic History  . Marital status: Married    Spouse name: Not on file  . Number of children: Not on file  . Years of education: Not on file  . Highest education level: Not on file  Occupational History  . Not on file  Tobacco Use  . Smoking status: Never Smoker  . Smokeless tobacco: Never Used  Vaping Use  . Vaping Use: Never used  Substance and Sexual Activity  . Alcohol use: No  . Drug use: No  . Sexual activity: Not Currently  Other Topics Concern  . Not on file  Social History Narrative  . Not on file   Social Determinants of Health   Financial Resource Strain: Not on file  Food Insecurity: Not on file  Transportation Needs: Not on file  Physical Activity: Not on file  Stress: Not on file  Social Connections: Not on file  Intimate Partner Violence: Not on file    Family History  Problem Relation Age of Onset  . Diabetes Mother   . Asthma Father   . Colon cancer Neg Hx   . Stomach cancer Neg Hx   . Esophageal cancer Neg Hx   . Pancreatic cancer Neg Hx   . Liver disease Neg Hx     Past Surgical History:  Procedure Laterality Date  . CHOLECYSTECTOMY     approx 6 yrs ago in [place]  . COLONOSCOPY     approx 15 years ago in [place]    ROS: Review of Systems Negative except as stated above  PHYSICAL EXAM: BP 117/73   Pulse 75   Resp 16   Wt 167 lb 3.2 oz (75.8 kg)   SpO2 96%   BMI 28.70 kg/m   Physical Exam  General appearance - alert, well appearing, and in no distress Mental status - normal mood, behavior, speech, dress, motor activity, and thought processes Mouth: Oral mucosa moist. Neck - supple, no  significant adenopathy Chest - clear to auscultation, no wheezes, rales or rhonchi, symmetric air entry Heart - normal rate, regular rhythm, normal S1, S2, no murmurs, rubs, clicks or gallops Abdomen: Normal bowel sounds, soft, nontender, no organomegaly. Extremities - peripheral pulses normal, no pedal edema, no clubbing or cyanosis    CMP Latest Ref Rng & Units 10/05/2020 07/24/2020 05/29/2019  Glucose 70 - 99 mg/dL 89 103(X) 752(M)  BUN 6 - 23 mg/dL 11 15 15  Creatinine 0.40 - 1.20 mg/dL 0.67 0.68 0.78  Sodium 135 - 145 mEq/L 139 132(L) 136  Potassium 3.5 - 5.1 mEq/L 3.7 4.7 4.3  Chloride 96 - 112 mEq/L 106 97 99  CO2 19 - 32 mEq/L 25 24 20   Calcium 8.4 - 10.5 mg/dL 8.8 9.6 9.5  Total Protein 6.0 - 8.3 g/dL 7.7 7.7 7.7  Total Bilirubin 0.2 - 1.2 mg/dL 0.2 <0.2 <0.2  Alkaline Phos 39 - 117 U/L 113 193(H) 122(H)  AST 0 - 37 U/L 15 26 19   ALT 0 - 35 U/L 16 55(H) 30   Lipid Panel     Component Value Date/Time   CHOL 192 07/24/2020 1218   TRIG 91 07/24/2020 1218   HDL 53 07/24/2020 1218   CHOLHDL 3.6 07/24/2020 1218   CHOLHDL 4.6 12/13/2016 1149   VLDL 41 (H) 12/13/2016 1149   LDLCALC 122 (H) 07/24/2020 1218    CBC    Component Value Date/Time   WBC 8.1 10/05/2020 1615   RBC 4.05 10/05/2020 1615   HGB 11.6 (L) 10/05/2020 1615   HGB 12.0 07/24/2020 1218   HCT 35.1 (L) 10/05/2020 1615   HCT 36.9 07/24/2020 1218   PLT 446.0 (H) 10/05/2020 1615   PLT 364 07/24/2020 1218   MCV 86.7 10/05/2020 1615   MCV 89 07/24/2020 1218   MCH 29.1 07/24/2020 1218   MCH 28.8 12/13/2016 1149   MCHC 33.1 10/05/2020 1615   RDW 13.0 10/05/2020 1615   RDW 12.7 07/24/2020 1218   LYMPHSABS 2.3 10/05/2020 1615   MONOABS 0.4 10/05/2020 1615   EOSABS 0.2 10/05/2020 1615   BASOSABS 0.1 10/05/2020 1615   Depression screen PHQ 2/9 04/29/2021 10/15/2020 12/10/2018  Decreased Interest 2 2 3   Down, Depressed, Hopeless 1 1 2   PHQ - 2 Score 3 3 5   Altered sleeping 2 1 3   Tired, decreased energy 2 2 3    Change in appetite 2 1 3   Feeling bad or failure about yourself  1 1 3   Trouble concentrating 2 2 2   Moving slowly or fidgety/restless 1 1 2   Suicidal thoughts 0 1 0  PHQ-9 Score 13 12 21   Some recent data might be hidden    ASSESSMENT AND PLAN: 1. Type 2 diabetes mellitus with hyperlipidemia (HCC) Controlled.  Encourage her to increase her exercise to 15 minutes every other day.  Continue healthy eating habits.  Continue Janumet.  Stop insulin. - POCT glucose (manual entry) - POCT glycosylated hemoglobin (Hb A1C) - Microalbumin / creatinine urine ratio - Ambulatory referral to Ophthalmology  2. Rheumatoid arthritis involving multiple sites with positive rheumatoid factor ([place]) Followed by rheumatology.  Symptoms controlled on current DMARDs  3. Dyspepsia Advised to avoid spicy foods.  We will give a trial of Pepcid.  PPI interacts with methotrexate. - famotidine (PEPCID) 20 MG tablet; Take 1 tablet (20 mg total) by mouth 2 (two) times daily.  Dispense: 60 tablet; Refill: 3  4. Encounter for screening mammogram for malignant neoplasm of breast - MM Digital Screening; Future  5. Screening for colon cancer - Ambulatory referral to Gastroenterology    Patient was given the opportunity to ask questions.  Patient verbalized understanding of the plan and was able to repeat key elements of the plan.  [employer] Language interpreter used during this encounter. #[id number], [place]  Orders Placed This Encounter  Procedures  . Microalbumin / creatinine urine ratio  . POCT glucose (manual entry)  . POCT glycosylated hemoglobin (Hb A1C)  Requested Prescriptions    No prescriptions requested or ordered in this encounter    No follow-ups on file.  Karle Plumber, MD, FACP

## 2021-04-30 ENCOUNTER — Other Ambulatory Visit: Payer: Self-pay

## 2021-05-03 ENCOUNTER — Other Ambulatory Visit: Payer: Self-pay

## 2021-05-03 ENCOUNTER — Ambulatory Visit: Payer: Medicare Other | Attending: Internal Medicine

## 2021-05-03 DIAGNOSIS — E785 Hyperlipidemia, unspecified: Secondary | ICD-10-CM

## 2021-05-03 DIAGNOSIS — E1169 Type 2 diabetes mellitus with other specified complication: Secondary | ICD-10-CM

## 2021-05-04 LAB — COMPREHENSIVE METABOLIC PANEL
ALT: 33 IU/L — ABNORMAL HIGH (ref 0–32)
AST: 24 IU/L (ref 0–40)
Albumin/Globulin Ratio: 1.2 (ref 1.2–2.2)
Albumin: 4.1 g/dL (ref 3.8–4.9)
Alkaline Phosphatase: 105 IU/L (ref 44–121)
BUN/Creatinine Ratio: 18 (ref 12–28)
BUN: 12 mg/dL (ref 8–27)
Bilirubin Total: 0.2 mg/dL (ref 0.0–1.2)
CO2: 20 mmol/L (ref 20–29)
Calcium: 9.4 mg/dL (ref 8.7–10.3)
Chloride: 106 mmol/L (ref 96–106)
Creatinine, Ser: 0.67 mg/dL (ref 0.57–1.00)
Globulin, Total: 3.5 g/dL (ref 1.5–4.5)
Glucose: 132 mg/dL — ABNORMAL HIGH (ref 65–99)
Potassium: 4.3 mmol/L (ref 3.5–5.2)
Sodium: 140 mmol/L (ref 134–144)
Total Protein: 7.6 g/dL (ref 6.0–8.5)
eGFR: 100 mL/min/{1.73_m2} (ref >59)

## 2021-05-04 LAB — CBC
Hematocrit: 35.9 % (ref 34.0–46.6)
Hemoglobin: 11.6 g/dL (ref 11.1–15.9)
MCH: 28.4 pg (ref 26.6–33.0)
MCHC: 32.3 g/dL (ref 31.5–35.7)
MCV: 88 fL (ref 79–97)
Platelets: 342 10*3/uL (ref 150–450)
RBC: 4.09 x10E6/uL (ref 3.77–5.28)
RDW: 13.1 % (ref 11.7–15.4)
WBC: 8.8 10*3/uL (ref 3.4–10.8)

## 2021-05-04 LAB — MICROALBUMIN / CREATININE URINE RATIO
Creatinine, Urine: 44.3 mg/dL
Microalb/Creat Ratio: 16 mg/g creat (ref 0–29)
Microalbumin, Urine: 7.3 ug/mL

## 2021-05-04 LAB — LIPID PANEL
Chol/HDL Ratio: 4.3 ratio (ref 0.0–4.4)
Cholesterol, Total: 180 mg/dL (ref 100–199)
HDL: 42 mg/dL (ref >39)
LDL Chol Calc (NIH): 110 mg/dL — ABNORMAL HIGH (ref 0–99)
Triglycerides: 159 mg/dL — ABNORMAL HIGH (ref 0–149)
VLDL Cholesterol Cal: 28 mg/dL (ref 5–40)

## 2021-05-24 ENCOUNTER — Encounter: Payer: Self-pay | Admitting: *Deleted

## 2021-06-04 ENCOUNTER — Other Ambulatory Visit: Payer: Self-pay

## 2021-06-10 ENCOUNTER — Other Ambulatory Visit: Payer: Self-pay

## 2021-06-10 ENCOUNTER — Ambulatory Visit: Payer: Medicare Other | Attending: Internal Medicine | Admitting: Internal Medicine

## 2021-06-10 ENCOUNTER — Other Ambulatory Visit (HOSPITAL_COMMUNITY)
Admission: RE | Admit: 2021-06-10 | Discharge: 2021-06-10 | Disposition: A | Discharge status: Home / Self Care | Payer: Medicare Other | Source: Ambulatory Visit | Attending: Internal Medicine | Admitting: Internal Medicine

## 2021-06-10 ENCOUNTER — Encounter: Payer: Self-pay | Admitting: Internal Medicine

## 2021-06-10 VITALS — BP 129/75 | HR 62 | Resp 16 | Wt 168.4 lb

## 2021-06-10 DIAGNOSIS — Z124 Encounter for screening for malignant neoplasm of cervix: Secondary | ICD-10-CM | POA: Insufficient documentation

## 2021-06-10 DIAGNOSIS — Z79899 Other long term (current) drug therapy: Secondary | ICD-10-CM | POA: Diagnosis not present

## 2021-06-10 DIAGNOSIS — E119 Type 2 diabetes mellitus without complications: Secondary | ICD-10-CM | POA: Insufficient documentation

## 2021-06-10 DIAGNOSIS — M069 Rheumatoid arthritis, unspecified: Secondary | ICD-10-CM | POA: Insufficient documentation

## 2021-06-10 DIAGNOSIS — Z1211 Encounter for screening for malignant neoplasm of colon: Secondary | ICD-10-CM | POA: Diagnosis not present

## 2021-06-10 DIAGNOSIS — Z2821 Immunization not carried out because of patient refusal: Secondary | ICD-10-CM | POA: Diagnosis not present

## 2021-06-10 DIAGNOSIS — M810 Age-related osteoporosis without current pathological fracture: Secondary | ICD-10-CM | POA: Diagnosis not present

## 2021-06-10 DIAGNOSIS — Z794 Long term (current) use of insulin: Secondary | ICD-10-CM | POA: Diagnosis not present

## 2021-06-10 DIAGNOSIS — E785 Hyperlipidemia, unspecified: Secondary | ICD-10-CM | POA: Diagnosis not present

## 2021-06-10 MED ORDER — JANUMET 50-1000 MG PO TABS
1.0000 | ORAL_TABLET | Freq: Two times a day (BID) | ORAL | 6 refills | Status: DC
  Filled 2021-07-15 – 2021-07-19 (×2): qty 180, 90d supply, fill #0
  Filled 2021-10-04: qty 180, 90d supply, fill #1

## 2021-06-10 NOTE — Progress Notes (Signed)
Patient ID: Natalie Cherry, female    DOB: 09/20/1961  MRN: [id number]  CC: Gynecologic Exam   Subjective: Natalie Cherry is a 60 y.o. female who presents for pap Her concerns today include:  Pt with hx of DM, HL, RA, osteoporosis on Prolia  GYN History:  Pt is G6P6 Any hx of abn paps?:no Menses regular or irregular?: pt menopausal How long does menses last? NA Menstrual flow light or heavy?:NA Method of birth control?: NA Any vaginal dischg at this time?: no Dysuria?: no Any hx of STI?: no Sexually active with how many partners: only female spouse Desires STI screen: yes Last MMG: ordered on last visit 04/2021.  Pt states she was not called as yet Family hx of uterine, cervical or breast cancer?:  no  HM:  due for colon cancer screening.  She is due for shingles vaccine and pneumonia vaccine.  She declines both.  She requests refills on Janumet   Patient Active Problem List   Diagnosis Date Noted   Hyperlipidemia associated with type 2 diabetes mellitus ([place]) 07/24/2020   Over weight 07/24/2020   Influenza vaccination declined 07/24/2020   Osteoporosis without current pathological fracture 12/07/2018   Type 2 diabetes mellitus with hyperglycemia, without long-term current use of insulin ([place]) 11/06/2015   Rheumatoid arthritis ([place]) 01/29/2014   Vitamin D deficiency 01/29/2014   DENTAL CARIES 03/02/2009   Osteoarthritis of spine 10/05/2007     Current Outpatient Medications on File Prior to Visit  Medication Sig Dispense Refill   Accu-Chek Softclix Lancets lancets Use as instructed to check blood sugar three times daily. E11.9 100 each 12   atorvastatin (LIPITOR) 20 MG tablet Take 1 tablet (20 mg total) by mouth daily. 30 tablet 11   Blood Glucose Monitoring Suppl (ACCU-CHEK GUIDE) w/Device KIT 1 each by Does not apply route daily. Use as instructed to check blood sugar three times daily. E11.9 1 kit 0   cetirizine (ZYRTEC ALLERGY) 10 MG tablet Take 1 tablet (10 mg total) by  mouth daily. 30 tablet 0   denosumab (PROLIA) 60 MG/ML SOSY injection Inject 60 mg into the skin every 6 (six) months.     diclofenac Sodium (VOLTAREN) 1 % GEL Apply 2 g topically 4 (four) times daily. 100 g 2   diphenhydrAMINE (BENADRYL) 50 MG capsule Take 50 mg by mouth every 6 (six) hours as needed.     etanercept (ENBREL SURECLICK) 50 MG/ML injection INJECT 50MG SUBCUTANEOUSLY ONCE WEEKLY AS DIRECTED.     famotidine (PEPCID) 20 MG tablet Take 1 tablet (20 mg total) by mouth 2 (two) times daily. 60 tablet 3   glucose blood (ACCU-CHEK GUIDE) test strip Use as instructed to check blood sugar three times daily. E11.9 100 each 2   hydroxychloroquine (PLAQUENIL) 200 MG tablet Take 200 mg by mouth daily.      ibuprofen (ADVIL) 200 MG tablet Take 200 mg by mouth every 6 (six) hours as needed.     Insulin Pen Needle (PEN NEEDLES) 31G X 5 MM MISC 1 Dose by Does not apply route daily. 100 each 0   Methotrexate, PF, 25 MG/0.4ML SOAJ Inject 0.7 mLs into the skin once a week.      No current facility-administered medications on file prior to visit.    No Known Allergies  Social History   Socioeconomic History   Marital status: Married    Spouse name: Not on file   Number of children: Not on file   Years of education:  Not on file   Highest education level: Not on file  Occupational History   Not on file  Tobacco Use   Smoking status: Never   Smokeless tobacco: Never  Vaping Use   Vaping Use: Never used  Substance and Sexual Activity   Alcohol use: No   Drug use: No   Sexual activity: Not Currently  Other Topics Concern   Not on file  Social History Narrative   Not on file   Social Determinants of Health   Financial Resource Strain: Not on file  Food Insecurity: Not on file  Transportation Needs: Not on file  Physical Activity: Not on file  Stress: Not on file  Social Connections: Not on file  Intimate Partner Violence: Not on file    Family History  Problem Relation Age of  Onset   Diabetes Mother    Asthma Father    Colon cancer Neg Hx    Stomach cancer Neg Hx    Esophageal cancer Neg Hx    Pancreatic cancer Neg Hx    Liver disease Neg Hx     Past Surgical History:  Procedure Laterality Date   CHOLECYSTECTOMY     approx 6 yrs ago in [place]   COLONOSCOPY     approx 15 years ago in [place]    ROS: Review of Systems Negative except as stated above  PHYSICAL EXAM: BP 129/75   Pulse 62   Resp 16   Wt 168 lb 6.4 oz (76.4 kg)   SpO2 96%   BMI 28.91 kg/m   Physical Exam  General appearance - alert, well appearing, and in no distress Mental status - normal mood, behavior, speech, dress, motor activity, and thought processes Breasts -CMA Sallyanne Havers was present for both breast and pelvic exams: Breasts appear normal, no suspicious masses, no skin or nipple changes or axillary nodes Pelvic - normal external genitalia, vulva, vagina, cervix, uterus and adnexa   CMP Latest Ref Rng & Units 05/03/2021 10/05/2020 07/24/2020  Glucose 65 - 99 mg/dL 132(H) 89 417(H)  BUN 8 - 27 mg/dL _0 Creatinine 0.57 - 1.00 mg/dL 0.67 0.67 0.68  Sodium 134 - 144 mmol/L 140 139 132(L)  Potassium 3.5 - 5.2 mmol/L 4.3 3.7 4.7  Chloride 96 - 106 mmol/L 106 106 97  CO2 20 - 29 mmol/L _1 Calcium 8.7 - 10.3 mg/dL 9.4 8.8 9.6  Total Protein 6.0 - 8.5 g/dL 7.6 7.7 7.7  Total Bilirubin 0.0 - 1.2 mg/dL 0.2 0.2 <0.2  Alkaline Phos 44 - 121 IU/L 105 113 193(H)  AST 0 - 40 IU/L _2 ALT 0 - 32 IU/L 33(H) 16 55(H)   Lipid Panel     Component Value Date/Time   CHOL 180 05/03/2021 1009   TRIG 159 (H) 05/03/2021 1009   HDL 42 05/03/2021 1009   CHOLHDL 4.3 05/03/2021 1009   CHOLHDL 4.6 12/13/2016 1149   VLDL 41 (H) 12/13/2016 1149   LDLCALC 110 (H) 05/03/2021 1009    CBC    Component Value Date/Time   WBC 8.8 05/03/2021 1009   WBC 8.1 10/05/2020 1615   RBC 4.09 05/03/2021 1009   RBC 4.05 10/05/2020 1615   HGB 11.6 05/03/2021 1009   HCT 35.9 05/03/2021 1009    PLT 342 05/03/2021 1009   MCV 88 05/03/2021 1009   MCH 28.4 05/03/2021 1009   MCH 28.8 12/13/2016 1149   MCHC 32.3 05/03/2021 1009   MCHC 33.1 10/05/2020 1615  RDW 13.1 05/03/2021 1009   LYMPHSABS 2.3 10/05/2020 1615   MONOABS 0.4 10/05/2020 1615   EOSABS 0.2 10/05/2020 1615   BASOSABS 0.1 10/05/2020 1615    ASSESSMENT AND PLAN: 1. Pap smear for cervical cancer screening - Cervicovaginal ancillary only Patient given the phone contact to call [facility] imaging to schedule her mammogram. - Cytology - PAP  2. Screening for colon cancer Discussed colon cancer screening methods.  Patient prefers to have Cologuard - Cologuard  3. Pneumococcal vaccination declined     Patient was given the opportunity to ask questions.  Patient verbalized understanding of the plan and was able to repeat key elements of the plan.  [employer] Language interpreter used during this encounter. #[phone], Rosa  Orders Placed This Encounter  Procedures   Cologuard     Requested Prescriptions   Signed Prescriptions Disp Refills   sitaGLIPtin-metformin (JANUMET) 50-1000 MG tablet 60 tablet 6    Sig: Take 1 tablet by mouth 2 (two) times daily with a meal.    Return in about 4 months (around 10/11/2021).  Karle Plumber, MD, FACP

## 2021-06-11 ENCOUNTER — Other Ambulatory Visit: Payer: Self-pay

## 2021-06-11 ENCOUNTER — Other Ambulatory Visit: Payer: Self-pay | Admitting: Internal Medicine

## 2021-06-11 LAB — CERVICOVAGINAL ANCILLARY ONLY
Bacterial Vaginitis (gardnerella): NEGATIVE
Candida Glabrata: NEGATIVE
Candida Vaginitis: POSITIVE — AB
Chlamydia: NEGATIVE
Comment: NEGATIVE
Comment: NEGATIVE
Comment: NEGATIVE
Comment: NEGATIVE
Comment: NEGATIVE
Comment: NORMAL
Neisseria Gonorrhea: NEGATIVE
Trichomonas: NEGATIVE

## 2021-06-11 MED ORDER — FLUCONAZOLE 150 MG PO TABS
150.0000 mg | ORAL_TABLET | Freq: Once | ORAL | 0 refills | Status: AC
  Filled 2021-06-11: qty 1, 1d supply, fill #0

## 2021-06-14 ENCOUNTER — Other Ambulatory Visit: Payer: Self-pay

## 2021-06-15 LAB — CYTOLOGY - PAP
Chlamydia: NEGATIVE
Comment: NEGATIVE
Comment: NEGATIVE
Comment: NORMAL
Diagnosis: NEGATIVE
High risk HPV: NEGATIVE
Neisseria Gonorrhea: NEGATIVE

## 2021-06-18 ENCOUNTER — Telehealth: Payer: Self-pay

## 2021-06-18 ENCOUNTER — Other Ambulatory Visit: Payer: Self-pay

## 2021-06-18 NOTE — Telephone Encounter (Signed)
Contacted pt to go over MM results pt didn't answer lvm asking pt to give me a call at her/his earliest convenience  [employer] interpreter: Jeanette Caprice ID [phone] Sent a CRM and forward labs to NT to give pt labs when they call back

## 2021-06-21 ENCOUNTER — Other Ambulatory Visit: Payer: Self-pay

## 2021-06-22 DIAGNOSIS — E119 Type 2 diabetes mellitus without complications: Secondary | ICD-10-CM | POA: Diagnosis not present

## 2021-06-22 DIAGNOSIS — M069 Rheumatoid arthritis, unspecified: Secondary | ICD-10-CM | POA: Diagnosis not present

## 2021-06-22 DIAGNOSIS — E785 Hyperlipidemia, unspecified: Secondary | ICD-10-CM | POA: Diagnosis not present

## 2021-06-22 DIAGNOSIS — M1990 Unspecified osteoarthritis, unspecified site: Secondary | ICD-10-CM | POA: Diagnosis not present

## 2021-06-22 DIAGNOSIS — Z79899 Other long term (current) drug therapy: Secondary | ICD-10-CM | POA: Diagnosis not present

## 2021-06-22 DIAGNOSIS — I10 Essential (primary) hypertension: Secondary | ICD-10-CM | POA: Diagnosis not present

## 2021-06-22 DIAGNOSIS — M810 Age-related osteoporosis without current pathological fracture: Secondary | ICD-10-CM | POA: Diagnosis not present

## 2021-06-24 ENCOUNTER — Other Ambulatory Visit: Payer: Self-pay

## 2021-07-14 DIAGNOSIS — H16223 Keratoconjunctivitis sicca, not specified as Sjogren's, bilateral: Secondary | ICD-10-CM | POA: Diagnosis not present

## 2021-07-14 DIAGNOSIS — H02834 Dermatochalasis of left upper eyelid: Secondary | ICD-10-CM | POA: Diagnosis not present

## 2021-07-14 DIAGNOSIS — H0102A Squamous blepharitis right eye, upper and lower eyelids: Secondary | ICD-10-CM | POA: Diagnosis not present

## 2021-07-14 DIAGNOSIS — H524 Presbyopia: Secondary | ICD-10-CM | POA: Diagnosis not present

## 2021-07-14 DIAGNOSIS — H0102B Squamous blepharitis left eye, upper and lower eyelids: Secondary | ICD-10-CM | POA: Diagnosis not present

## 2021-07-14 DIAGNOSIS — H52223 Regular astigmatism, bilateral: Secondary | ICD-10-CM | POA: Diagnosis not present

## 2021-07-14 DIAGNOSIS — H5203 Hypermetropia, bilateral: Secondary | ICD-10-CM | POA: Diagnosis not present

## 2021-07-14 DIAGNOSIS — H02831 Dermatochalasis of right upper eyelid: Secondary | ICD-10-CM | POA: Diagnosis not present

## 2021-07-14 DIAGNOSIS — H2513 Age-related nuclear cataract, bilateral: Secondary | ICD-10-CM | POA: Diagnosis not present

## 2021-07-14 DIAGNOSIS — H1045 Other chronic allergic conjunctivitis: Secondary | ICD-10-CM | POA: Diagnosis not present

## 2021-07-14 DIAGNOSIS — E119 Type 2 diabetes mellitus without complications: Secondary | ICD-10-CM | POA: Diagnosis not present

## 2021-07-14 LAB — HM DIABETES EYE EXAM

## 2021-07-15 ENCOUNTER — Other Ambulatory Visit: Payer: Self-pay

## 2021-07-15 DIAGNOSIS — Z20822 Contact with and (suspected) exposure to covid-19: Secondary | ICD-10-CM | POA: Diagnosis not present

## 2021-07-19 ENCOUNTER — Other Ambulatory Visit: Payer: Self-pay

## 2021-07-21 ENCOUNTER — Other Ambulatory Visit: Payer: Self-pay

## 2021-07-28 DIAGNOSIS — Z03818 Encounter for observation for suspected exposure to other biological agents ruled out: Secondary | ICD-10-CM | POA: Diagnosis not present

## 2021-08-01 DIAGNOSIS — Z20822 Contact with and (suspected) exposure to covid-19: Secondary | ICD-10-CM | POA: Diagnosis not present

## 2021-10-04 ENCOUNTER — Other Ambulatory Visit: Payer: Self-pay

## 2021-10-08 ENCOUNTER — Other Ambulatory Visit: Payer: Self-pay

## 2021-10-11 ENCOUNTER — Ambulatory Visit: Payer: Medicare Other | Admitting: Internal Medicine

## 2021-12-10 ENCOUNTER — Ambulatory Visit (HOSPITAL_COMMUNITY)
Admission: EM | Admit: 2021-12-10 | Discharge: 2021-12-10 | Disposition: A | Discharge status: Home / Self Care | Payer: Medicare Other | Attending: Urgent Care | Admitting: Urgent Care

## 2021-12-10 ENCOUNTER — Ambulatory Visit: Payer: Self-pay | Admitting: *Deleted

## 2021-12-10 ENCOUNTER — Other Ambulatory Visit: Payer: Self-pay

## 2021-12-10 ENCOUNTER — Encounter (HOSPITAL_COMMUNITY): Payer: Self-pay

## 2021-12-10 DIAGNOSIS — R1084 Generalized abdominal pain: Secondary | ICD-10-CM | POA: Diagnosis not present

## 2021-12-10 DIAGNOSIS — E1129 Type 2 diabetes mellitus with other diabetic kidney complication: Secondary | ICD-10-CM | POA: Diagnosis not present

## 2021-12-10 DIAGNOSIS — M069 Rheumatoid arthritis, unspecified: Secondary | ICD-10-CM | POA: Insufficient documentation

## 2021-12-10 DIAGNOSIS — R350 Frequency of micturition: Secondary | ICD-10-CM | POA: Insufficient documentation

## 2021-12-10 DIAGNOSIS — E119 Type 2 diabetes mellitus without complications: Secondary | ICD-10-CM | POA: Diagnosis not present

## 2021-12-10 DIAGNOSIS — N10 Acute tubulo-interstitial nephritis: Secondary | ICD-10-CM | POA: Diagnosis not present

## 2021-12-10 DIAGNOSIS — Z794 Long term (current) use of insulin: Secondary | ICD-10-CM | POA: Diagnosis not present

## 2021-12-10 LAB — POCT URINALYSIS DIPSTICK, ED / UC
Glucose, UA: >=1000 mg/dL — AB
Ketones, ur: 15 mg/dL — AB
Leukocytes,Ua: NEGATIVE
Nitrite: POSITIVE — AB
Protein, ur: 100 mg/dL — AB
Specific Gravity, Urine: >=1.03 (ref 1.005–1.030)
Urobilinogen, UA: 2 mg/dL — ABNORMAL HIGH (ref 0.0–1.0)
pH: 5 (ref 5.0–8.0)

## 2021-12-10 MED ORDER — CEFTRIAXONE SODIUM 1 G IJ SOLR
INTRAMUSCULAR | Status: AC
  Filled 2021-12-10: qty 10

## 2021-12-10 MED ORDER — CEFTRIAXONE SODIUM 1 G IJ SOLR
1.0000 g | Freq: Once | INTRAMUSCULAR | Status: AC
  Administered 2021-12-10: 1 g via INTRAMUSCULAR

## 2021-12-10 MED ORDER — CIPROFLOXACIN HCL 500 MG PO TABS: 500.0000 mg | ORAL_TABLET | Freq: Two times a day (BID) | ORAL | 0 refills | Status: DC

## 2021-12-10 MED ORDER — LIDOCAINE HCL (PF) 1 % IJ SOLN
INTRAMUSCULAR | Status: AC
  Filled 2021-12-10: qty 2

## 2021-12-10 NOTE — Telephone Encounter (Signed)
Summary: patient not feeling well with fever   Patient daughter Dedra Skeens called was not with patient at time of call  say that patient have been running a fever for the past two days. Say last check was 101 but patient think she is sick from eating too much spicy foods. Asking for a nurse to please call patient with a spanish interpreter and instruct her what to do. Can be reached at Ph#  [phone]     Interpreter: [phone]  Attempted to call patient on number provided- no answer/no VM. Called patient number listed in chart- left message to call office.

## 2021-12-10 NOTE — Telephone Encounter (Signed)
Interpreter Christopher # [phone] called pt. Unable to leave VM d/t mailbox full. Will route to office per protocol r/t 3 attempts.

## 2021-12-10 NOTE — Telephone Encounter (Signed)
Interpreter: Raymond Gurney [phone]  Second attempt to call patient on number provided and home number- same messages

## 2021-12-10 NOTE — ED Triage Notes (Signed)
Pt presents with c/o fever and lower abdominal pain x2 days.   States last week she had some abd pain, states it worsened this week.

## 2021-12-10 NOTE — ED Provider Notes (Signed)
[facility]   MRN: [id number] DOB: 1961-07-13  Subjective:   Natalie Cherry is a 61 y.o. female presenting for 2-day history of acute onset generalized abdominal pain, fever, urinary frequency.  Patient has a history of type 2 diabetes treated without insulin.  She also has rheumatoid arthritis.  Denies dysuria, hematuria, vaginal discharge, pelvic pain, flank pain, hematuria.  No diarrhea, constipation.  No current facility-administered medications for this encounter.  Current Outpatient Medications:    Accu-Chek Softclix Lancets lancets, Use as instructed to check blood sugar three times daily. E11.9, Disp: 100 each, Rfl: 12   atorvastatin (LIPITOR) 20 MG tablet, Take 1 tablet (20 mg total) by mouth daily., Disp: 30 tablet, Rfl: 11   Blood Glucose Monitoring Suppl (ACCU-CHEK GUIDE) w/Device KIT, 1 each by Does not apply route daily. Use as instructed to check blood sugar three times daily. E11.9, Disp: 1 kit, Rfl: 0   cetirizine (ZYRTEC ALLERGY) 10 MG tablet, Take 1 tablet (10 mg total) by mouth daily., Disp: 30 tablet, Rfl: 0   denosumab (PROLIA) 60 MG/ML SOSY injection, Inject 60 mg into the skin every 6 (six) months., Disp: , Rfl:    diclofenac Sodium (VOLTAREN) 1 % GEL, Apply 2 g topically 4 (four) times daily., Disp: 100 g, Rfl: 2   diphenhydrAMINE (BENADRYL) 50 MG capsule, Take 50 mg by mouth every 6 (six) hours as needed., Disp: , Rfl:    etanercept (ENBREL SURECLICK) 50 MG/ML injection, INJECT $RemoveBeforeDEI'50MG'[id number]$  SUBCUTANEOUSLY ONCE WEEKLY AS DIRECTED., Disp: , Rfl:    famotidine (PEPCID) 20 MG tablet, Take 1 tablet (20 mg total) by mouth 2 (two) times daily., Disp: 60 tablet, Rfl: 3   glucose blood (ACCU-CHEK GUIDE) test strip, Use as instructed to check blood sugar three times daily. E11.9, Disp: 100 each, Rfl: 2   hydroxychloroquine (PLAQUENIL) 200 MG tablet, Take 200 mg by mouth daily. , Disp: , Rfl:    ibuprofen (ADVIL) 200 MG tablet, Take 200 mg by mouth every 6 (six) hours  as needed., Disp: , Rfl:    Insulin Pen Needle (PEN NEEDLES) 31G X 5 MM MISC, 1 Dose by Does not apply route daily., Disp: 100 each, Rfl: 0   Methotrexate, PF, 25 MG/0.4ML SOAJ, Inject 0.7 mLs into the skin once a week. , Disp: , Rfl:    sitaGLIPtin-metformin (JANUMET) 50-1000 MG tablet, Take 1 tablet by mouth 2 (two) times daily with a meal., Disp: 60 tablet, Rfl: 6   No Known Allergies  Past Medical History:  Diagnosis Date   Arthritis    Diabetes mellitus    Hypertension      Past Surgical History:  Procedure Laterality Date   CHOLECYSTECTOMY     approx 6 yrs ago in [place]   COLONOSCOPY     approx 15 years ago in [place]    Family History  Problem Relation Age of Onset   Diabetes Mother    Asthma Father    Colon cancer Neg Hx    Stomach cancer Neg Hx    Esophageal cancer Neg Hx    Pancreatic cancer Neg Hx    Liver disease Neg Hx     Social History   Tobacco Use   Smoking status: Never   Smokeless tobacco: Never  Vaping Use   Vaping Use: Never used  Substance Use Topics   Alcohol use: No   Drug use: No    ROS   Objective:   Vitals: BP 113/72 (BP Location: Right Arm)  Pulse 76    Temp 99.1 F (37.3 C) (Oral)    Resp 15    SpO2 93%   Physical Exam Constitutional:      General: She is not in acute distress.    Appearance: Normal appearance. She is well-developed. She is not ill-appearing, toxic-appearing or diaphoretic.  HENT:     Head: Normocephalic and atraumatic.     Nose: Nose normal.     Mouth/Throat:     Mouth: Mucous membranes are moist.     Pharynx: Oropharynx is clear.  Eyes:     General: No scleral icterus.       Right eye: No discharge.        Left eye: No discharge.     Extraocular Movements: Extraocular movements intact.     Conjunctiva/sclera: Conjunctivae normal.     Pupils: Pupils are equal, round, and reactive to light.  Cardiovascular:     Rate and Rhythm: Normal rate.  Pulmonary:     Effort: Pulmonary effort is normal.   Abdominal:     General: Bowel sounds are normal. There is no distension.     Palpations: Abdomen is soft. There is no mass.     Tenderness: There is generalized abdominal tenderness (throughout). There is right CVA tenderness. There is no left CVA tenderness, guarding or rebound.  Skin:    General: Skin is warm and dry.  Neurological:     General: No focal deficit present.     Mental Status: She is alert and oriented to person, place, and time.  Psychiatric:        Mood and Affect: Mood normal.        Behavior: Behavior normal.        Thought Content: Thought content normal.        Judgment: Judgment normal.    Results for orders placed or performed during the hospital encounter of 12/10/21 (from the past 24 hour(s))  POC Urinalysis dipstick     Status: Abnormal   Collection Time: 12/10/21  6:32 PM  Result Value Ref Range   Glucose, UA >=1000 (A) NEGATIVE mg/dL   Bilirubin Urine LARGE (A) NEGATIVE   Ketones, ur 15 (A) NEGATIVE mg/dL   Specific Gravity, Urine >=1.030 1.005 - 1.030   Hgb urine dipstick MODERATE (A) NEGATIVE   pH 5.0 5.0 - 8.0   Protein, ur 100 (A) NEGATIVE mg/dL   Urobilinogen, UA 2.0 (H) 0.0 - 1.0 mg/dL   Nitrite POSITIVE (A) NEGATIVE   Leukocytes,Ua NEGATIVE NEGATIVE    Assessment and Plan :   PDMP not reviewed this encounter.  1. Acute pyelonephritis   2. Generalized abdominal pain   3. Urinary frequency   4. Type 2 diabetes mellitus treated with insulin ([place])   5. Rheumatoid arthritis, involving unspecified site, unspecified whether rheumatoid factor present ([facility])     IM ceftriaxone for pyelonephritis. Start ciprofloxacin as an outpatient, urine culture pending.  Recommended aggressive hydration, limiting urinary irritants. Counseled patient on potential for adverse effects with medications prescribed/recommended today, ER and return-to-clinic precautions discussed, patient verbalized understanding.    Jaynee Eagles, PA-C 12/10/21 1843

## 2021-12-13 ENCOUNTER — Telehealth (HOSPITAL_COMMUNITY): Payer: Self-pay | Admitting: Emergency Medicine

## 2021-12-13 LAB — URINE CULTURE: Culture: >=100000 — AB

## 2021-12-13 MED ORDER — CEPHALEXIN 500 MG PO CAPS: 500.0000 mg | ORAL_CAPSULE | Freq: Four times a day (QID) | ORAL | 0 refills | Status: AC

## 2021-12-14 DIAGNOSIS — Z743 Need for continuous supervision: Secondary | ICD-10-CM | POA: Diagnosis not present

## 2021-12-14 DIAGNOSIS — Z20822 Contact with and (suspected) exposure to covid-19: Secondary | ICD-10-CM | POA: Diagnosis not present

## 2021-12-14 DIAGNOSIS — R1084 Generalized abdominal pain: Secondary | ICD-10-CM | POA: Diagnosis not present

## 2021-12-14 DIAGNOSIS — I959 Hypotension, unspecified: Secondary | ICD-10-CM | POA: Diagnosis not present

## 2021-12-14 DIAGNOSIS — E1165 Type 2 diabetes mellitus with hyperglycemia: Secondary | ICD-10-CM | POA: Diagnosis not present

## 2021-12-14 DIAGNOSIS — E119 Type 2 diabetes mellitus without complications: Secondary | ICD-10-CM | POA: Diagnosis not present

## 2021-12-14 DIAGNOSIS — R110 Nausea: Secondary | ICD-10-CM | POA: Diagnosis not present

## 2021-12-14 DIAGNOSIS — Z9049 Acquired absence of other specified parts of digestive tract: Secondary | ICD-10-CM | POA: Diagnosis not present

## 2021-12-14 DIAGNOSIS — R1111 Vomiting without nausea: Secondary | ICD-10-CM | POA: Diagnosis not present

## 2021-12-14 DIAGNOSIS — I880 Nonspecific mesenteric lymphadenitis: Secondary | ICD-10-CM | POA: Diagnosis not present

## 2021-12-14 DIAGNOSIS — K859 Acute pancreatitis without necrosis or infection, unspecified: Secondary | ICD-10-CM | POA: Diagnosis not present

## 2021-12-14 NOTE — Telephone Encounter (Signed)
Will forward to provider  

## 2021-12-15 DIAGNOSIS — K859 Acute pancreatitis without necrosis or infection, unspecified: Secondary | ICD-10-CM | POA: Diagnosis not present

## 2021-12-15 DIAGNOSIS — E119 Type 2 diabetes mellitus without complications: Secondary | ICD-10-CM | POA: Diagnosis not present

## 2021-12-15 NOTE — Telephone Encounter (Signed)
Just receiving this message. Chart reviewed.  Patient was seen at [facility] after calling on 12/10/2021.  She was diagnosed with acute pyelonephritis and placed on ciprofloxacin.

## 2021-12-16 DIAGNOSIS — E119 Type 2 diabetes mellitus without complications: Secondary | ICD-10-CM | POA: Diagnosis not present

## 2021-12-16 DIAGNOSIS — K859 Acute pancreatitis without necrosis or infection, unspecified: Secondary | ICD-10-CM | POA: Diagnosis not present

## 2021-12-17 DIAGNOSIS — E119 Type 2 diabetes mellitus without complications: Secondary | ICD-10-CM | POA: Diagnosis not present

## 2021-12-17 DIAGNOSIS — K859 Acute pancreatitis without necrosis or infection, unspecified: Secondary | ICD-10-CM | POA: Diagnosis not present

## 2021-12-20 ENCOUNTER — Ambulatory Visit: Payer: Self-pay

## 2021-12-20 NOTE — Telephone Encounter (Signed)
Pt was hospitalized in [place] for acute pancreatitis last week.  Pt returned home Sat and was instructed to see hre pcp asap.  No appts at [facility]. Please advise.   Using Spanish interpreter # [phone], left message to call back.

## 2021-12-20 NOTE — Telephone Encounter (Signed)
Using Spanish interpreter [place] # [phone]. LM on VM to call back to discuss. Routing back to [facility] d/t unable to reach pt.

## 2021-12-20 NOTE — Telephone Encounter (Signed)
Used Spanish Engineer, water # [phone] and called [place]- LM on VM to call back.

## 2021-12-22 DIAGNOSIS — Z79899 Other long term (current) drug therapy: Secondary | ICD-10-CM | POA: Diagnosis not present

## 2021-12-22 DIAGNOSIS — E119 Type 2 diabetes mellitus without complications: Secondary | ICD-10-CM | POA: Diagnosis not present

## 2021-12-22 DIAGNOSIS — I10 Essential (primary) hypertension: Secondary | ICD-10-CM | POA: Diagnosis not present

## 2021-12-22 DIAGNOSIS — M810 Age-related osteoporosis without current pathological fracture: Secondary | ICD-10-CM | POA: Diagnosis not present

## 2021-12-22 DIAGNOSIS — E785 Hyperlipidemia, unspecified: Secondary | ICD-10-CM | POA: Diagnosis not present

## 2021-12-22 DIAGNOSIS — M1990 Unspecified osteoarthritis, unspecified site: Secondary | ICD-10-CM | POA: Diagnosis not present

## 2021-12-22 DIAGNOSIS — M069 Rheumatoid arthritis, unspecified: Secondary | ICD-10-CM | POA: Diagnosis not present

## 2022-01-11 ENCOUNTER — Encounter: Payer: Self-pay | Admitting: Internal Medicine

## 2022-01-11 ENCOUNTER — Other Ambulatory Visit: Payer: Self-pay

## 2022-01-11 ENCOUNTER — Ambulatory Visit: Payer: Medicare Other | Attending: Internal Medicine | Admitting: Internal Medicine

## 2022-01-11 VITALS — BP 115/72 | HR 72 | Resp 16 | Ht 64.0 in | Wt 162.8 lb

## 2022-01-11 DIAGNOSIS — D649 Anemia, unspecified: Secondary | ICD-10-CM

## 2022-01-11 DIAGNOSIS — E785 Hyperlipidemia, unspecified: Secondary | ICD-10-CM | POA: Diagnosis not present

## 2022-01-11 DIAGNOSIS — Z7984 Long term (current) use of oral hypoglycemic drugs: Secondary | ICD-10-CM | POA: Diagnosis not present

## 2022-01-11 DIAGNOSIS — Z1211 Encounter for screening for malignant neoplasm of colon: Secondary | ICD-10-CM

## 2022-01-11 DIAGNOSIS — M0579 Rheumatoid arthritis with rheumatoid factor of multiple sites without organ or systems involvement: Secondary | ICD-10-CM

## 2022-01-11 DIAGNOSIS — Z79620 Long term (current) use of immunosuppressive biologic: Secondary | ICD-10-CM | POA: Insufficient documentation

## 2022-01-11 DIAGNOSIS — Z1231 Encounter for screening mammogram for malignant neoplasm of breast: Secondary | ICD-10-CM

## 2022-01-11 DIAGNOSIS — R1013 Epigastric pain: Secondary | ICD-10-CM

## 2022-01-11 DIAGNOSIS — Z79899 Other long term (current) drug therapy: Secondary | ICD-10-CM | POA: Diagnosis not present

## 2022-01-11 DIAGNOSIS — M810 Age-related osteoporosis without current pathological fracture: Secondary | ICD-10-CM | POA: Diagnosis not present

## 2022-01-11 DIAGNOSIS — E1169 Type 2 diabetes mellitus with other specified complication: Secondary | ICD-10-CM | POA: Diagnosis not present

## 2022-01-11 DIAGNOSIS — K219 Gastro-esophageal reflux disease without esophagitis: Secondary | ICD-10-CM

## 2022-01-11 DIAGNOSIS — E1165 Type 2 diabetes mellitus with hyperglycemia: Secondary | ICD-10-CM | POA: Insufficient documentation

## 2022-01-11 LAB — POCT GLYCOSYLATED HEMOGLOBIN (HGB A1C): HbA1c, POC (controlled diabetic range): 8.8 % — AB (ref 0.0–7.0)

## 2022-01-11 MED ORDER — OMEPRAZOLE 20 MG PO CPDR
20.0000 mg | DELAYED_RELEASE_CAPSULE | Freq: Every day | ORAL | 3 refills | Status: DC
  Filled 2022-01-11: qty 30, 30d supply, fill #0
  Filled 2022-01-11: qty 90, 90d supply, fill #0

## 2022-01-11 MED ORDER — JANUMET 50-1000 MG PO TABS
1.0000 | ORAL_TABLET | Freq: Two times a day (BID) | ORAL | 11 refills | Status: DC
  Filled 2022-01-11 (×2): qty 180, 90d supply, fill #0
  Filled 2022-02-25: qty 180, 90d supply, fill #1

## 2022-01-11 NOTE — Progress Notes (Addendum)
i   Patient ID: Natalie Cherry, female    DOB: 23-Jul-1961  MRN: [id number]  CC: Diabetes   Subjective: Natalie Cherry is a 61 y.o. female who presents for chronic ds management Her concerns today include:  Pt with hx of DM, HL, RA, osteoporosis on Prolia  HM:  MMG ordered 04/2021.  Pt states she was not called.  Due for colon CA screen  DIABETES TYPE 2 Last A1C:   Results for orders placed or performed in visit on 01/11/22  POCT glycosylated hemoglobin (Hb A1C)  Result Value Ref Range   Hemoglobin A1C     HbA1c POC (<> result, manual entry)     HbA1c, POC (prediabetic range)     HbA1c, POC (controlled diabetic range) 8.8 (A) 0.0 - 7.0 %    Med Adherence:  '[x]'  Yes  but out of RF on Janument x 1-2 mth.  Reports she was told she had to be seen for Rfs.  Has been drinking herbal tea to try to keep BS under control until she was seen today.  Looks like she still had Rfs on current rxn   Medication side effects:  '[]'  Yes    '[x]'  No Home Monitoring?  '[x]'  Yes every 3 days    Home glucose results range: 110-130.  One time last mth it was 300 Diet Adherence: '[x]'  Yes    '[]'  No  Reports being hospitalized in [place] for a few days after the Christmas holidays.  She was returning from [place] and developed intense pain in the upper abdomen.  Patient states they did a scan and an endoscopy and was told that she had an inflamed pancreas.  Told to follow-up with PCP posthospital discharge because she may need another endoscopy. Today she tells me that she gets burning pain in her stomach when she eats certain things like chili, food with hot sauce and when she drinks coffee.  Previously on Pepcid but has been out of it for several months.  She stopped ibuprofen a few months ago.  Not on any over-the-counter NSAIDs.  RA:  still followed by rheumatology.  No longer on MTX.  She is on Enbrel.  HL: Reports compliance with atorvastatin.  Patient Active Problem List   Diagnosis Date Noted   Hyperlipidemia  associated with type 2 diabetes mellitus ([place]) 07/24/2020   Over weight 07/24/2020   Influenza vaccination declined 07/24/2020   Osteoporosis without current pathological fracture 12/07/2018   Type 2 diabetes mellitus with hyperglycemia, without long-term current use of insulin ([place]) 11/06/2015   Rheumatoid arthritis ([place]) 01/29/2014   Vitamin D deficiency 01/29/2014   DENTAL CARIES 03/02/2009   Osteoarthritis of spine 10/05/2007     Current Outpatient Medications on File Prior to Visit  Medication Sig Dispense Refill   Accu-Chek Softclix Lancets lancets Use as instructed to check blood sugar three times daily. E11.9 100 each 12   atorvastatin (LIPITOR) 20 MG tablet Take 1 tablet (20 mg total) by mouth daily. 30 tablet 11   Blood Glucose Monitoring Suppl (ACCU-CHEK GUIDE) w/Device KIT 1 each by Does not apply route daily. Use as instructed to check blood sugar three times daily. E11.9 1 kit 0   cetirizine (ZYRTEC ALLERGY) 10 MG tablet Take 1 tablet (10 mg total) by mouth daily. 30 tablet 0   denosumab (PROLIA) 60 MG/ML SOSY injection Inject 60 mg into the skin every 6 (six) months.     diclofenac Sodium (VOLTAREN) 1 % GEL Apply 2 g topically 4 (four)  times daily. 100 g 2   diphenhydrAMINE (BENADRYL) 50 MG capsule Take 50 mg by mouth every 6 (six) hours as needed.     etanercept (ENBREL SURECLICK) 50 MG/ML injection INJECT 50MG SUBCUTANEOUSLY ONCE WEEKLY AS DIRECTED.     glucose blood (ACCU-CHEK GUIDE) test strip Use as instructed to check blood sugar three times daily. E11.9 100 each 2   hydroxychloroquine (PLAQUENIL) 200 MG tablet Take 200 mg by mouth daily.      Insulin Pen Needle (PEN NEEDLES) 31G X 5 MM MISC 1 Dose by Does not apply route daily. 100 each 0   No current facility-administered medications on file prior to visit.    No Known Allergies  Social History   Socioeconomic History   Marital status: Married    Spouse name: Not on file   Number of children: Not on file    Years of education: Not on file   Highest education level: Not on file  Occupational History   Not on file  Tobacco Use   Smoking status: Never   Smokeless tobacco: Never  Vaping Use   Vaping Use: Never used  Substance and Sexual Activity   Alcohol use: No   Drug use: No   Sexual activity: Not Currently  Other Topics Concern   Not on file  Social History Narrative   Not on file   Social Determinants of Health   Financial Resource Strain: Not on file  Food Insecurity: Not on file  Transportation Needs: Not on file  Physical Activity: Not on file  Stress: Not on file  Social Connections: Not on file  Intimate Partner Violence: Not on file    Family History  Problem Relation Age of Onset   Diabetes Mother    Asthma Father    Colon cancer Neg Hx    Stomach cancer Neg Hx    Esophageal cancer Neg Hx    Pancreatic cancer Neg Hx    Liver disease Neg Hx     Past Surgical History:  Procedure Laterality Date   CHOLECYSTECTOMY     approx 6 yrs ago in [place]   COLONOSCOPY     approx 15 years ago in [place]    ROS: Review of Systems Negative except as stated above  PHYSICAL EXAM: BP 115/72    Pulse 72    Resp 16    Ht '5\' 4"'  (1.626 m)    Wt 162 lb 12.8 oz (73.8 kg)    SpO2 98%    BMI 27.94 kg/m   Wt Readings from Last 3 Encounters:  01/11/22 162 lb 12.8 oz (73.8 kg)  06/10/21 168 lb 6.4 oz (76.4 kg)  04/29/21 167 lb 3.2 oz (75.8 kg)    Physical Exam  General appearance - alert, well appearing, older Hispanic female and in no distress Mental status - normal mood, behavior, speech, dress, motor activity, and thought processes Chest - clear to auscultation, no wheezes, rales or rhonchi, symmetric air entry Heart - normal rate, regular rhythm, normal S1, S2, no murmurs, rubs, clicks or gallops Abdomen -normal bowel sounds, soft, mild epigastric tenderness without guarding or rebound. Extremities - peripheral pulses normal, no pedal edema, no clubbing or cyanosis   CMP  Latest Ref Rng & Units 05/03/2021 10/05/2020 07/24/2020  Glucose 65 - 99 mg/dL 132(H) 89 417(H)  BUN 8 - 27 mg/dL '12 11 15  ' Creatinine 0.57 - 1.00 mg/dL 0.67 0.67 0.68  Sodium 134 - 144 mmol/L 140 139 132(L)  Potassium 3.5 -  5.2 mmol/L 4.3 3.7 4.7  Chloride 96 - 106 mmol/L 106 106 97  CO2 20 - 29 mmol/L '20 25 24  ' Calcium 8.7 - 10.3 mg/dL 9.4 8.8 9.6  Total Protein 6.0 - 8.5 g/dL 7.6 7.7 7.7  Total Bilirubin 0.0 - 1.2 mg/dL 0.2 0.2 <0.2  Alkaline Phos 44 - 121 IU/L 105 113 193(H)  AST 0 - 40 IU/L '24 15 26  ' ALT 0 - 32 IU/L 33(H) 16 55(H)   Lipid Panel     Component Value Date/Time   CHOL 180 05/03/2021 1009   TRIG 159 (H) 05/03/2021 1009   HDL 42 05/03/2021 1009   CHOLHDL 4.3 05/03/2021 1009   CHOLHDL 4.6 12/13/2016 1149   VLDL 41 (H) 12/13/2016 1149   LDLCALC 110 (H) 05/03/2021 1009    CBC    Component Value Date/Time   WBC 8.8 05/03/2021 1009   WBC 8.1 10/05/2020 1615   RBC 4.09 05/03/2021 1009   RBC 4.05 10/05/2020 1615   HGB 11.6 05/03/2021 1009   HCT 35.9 05/03/2021 1009   PLT 342 05/03/2021 1009   MCV 88 05/03/2021 1009   MCH 28.4 05/03/2021 1009   MCH 28.8 12/13/2016 1149   MCHC 32.3 05/03/2021 1009   MCHC 33.1 10/05/2020 1615   RDW 13.1 05/03/2021 1009   LYMPHSABS 2.3 10/05/2020 1615   MONOABS 0.4 10/05/2020 1615   EOSABS 0.2 10/05/2020 1615   BASOSABS 0.1 10/05/2020 1615    ASSESSMENT AND PLAN:  1. Type 2 diabetes mellitus with hyperlipidemia (HCC) Not at goal.  She has been out of Janumet.  Refill given today. Healthy eating habits encouraged. - POCT glycosylated hemoglobin (Hb A1C) - CBC; Future - Comprehensive metabolic panel; Future - sitaGLIPtin-metformin (JANUMET) 50-1000 MG tablet; Take 1 tablet by mouth 2 (two) times daily with a meal.  Dispense: 60 tablet; Refill: 11  2. Gastroesophageal reflux disease without esophagitis GERD precautions discussed.  Advised to avoid certain foods like spicy foods, tomato-based foods, juices and excessive  caffeine.  Advised to eat his last meal at least 2 to 3 hours before laying down at nights and to sleep with his head slightly elevated.   -We will put her on omeprazole instead of refilling famotidine. -Advised not to take any over-the-counter NSAIDs - omeprazole (PRILOSEC) 20 MG capsule; Take 1 capsule (20 mg total) by mouth daily.  Dispense: 30 capsule; Refill: 3  3. Epigastric pain Current abdominal pain sounds like GERD.  However patient states she was hospitalized with inflammation of the pancreas.  She does not remember the name of the hospital.  She will bring her discharge papers to the front desk tomorrow so that they can make a copy for me.  I also request that when she comes she signed a release for [place] to get hospital records for me to review.   -I will have her follow-up with me again in 2 months for reassessment. hopefully by that time I would have received the records and have reviewed.  Patient expressed understanding - Lipase; Future  4. Hyperlipidemia associated with type 2 diabetes mellitus (HCC) Continue atorvastatin.  5. Rheumatoid arthritis involving multiple sites with positive rheumatoid factor ([place]) Followed by rheumatology.  Currently on Enbrel  6. Encounter for screening mammogram for malignant neoplasm of breast - MM Digital Screening; Future  7. Screening for colon cancer Discussed colon cancer screening methods.  Patient prefers to have colonoscopy. - Ambulatory referral to Gastroenterology   [employer] Language interpreter used during this encounter. #Lya  [phone]  Patient was given the opportunity to ask questions.  Patient verbalized understanding of the plan and was able to repeat key elements of the plan.   Addendum 01/13/2022: Patient with normocytic anemia on blood test.  We will add iron studies. Orders Placed This Encounter  Procedures   MM Digital Screening   CBC   Comprehensive metabolic panel   Lipase   Ambulatory referral to Gastroenterology   POCT  glycosylated hemoglobin (Hb A1C)     Requested Prescriptions   Signed Prescriptions Disp Refills   omeprazole (PRILOSEC) 20 MG capsule 30 capsule 3    Sig: Take 1 capsule (20 mg total) by mouth daily.   sitaGLIPtin-metformin (JANUMET) 50-1000 MG tablet 60 tablet 11    Sig: Take 1 tablet by mouth 2 (two) times daily with a meal.    Return in about 2 months (around 03/11/2022) for Come to lab tomorrow. Sign release for me to get records from hospital in [place].Karle Plumber, MD, FACP

## 2022-01-12 ENCOUNTER — Ambulatory Visit: Payer: Medicare Other | Attending: Internal Medicine

## 2022-01-12 DIAGNOSIS — E1169 Type 2 diabetes mellitus with other specified complication: Secondary | ICD-10-CM

## 2022-01-12 DIAGNOSIS — H0102A Squamous blepharitis right eye, upper and lower eyelids: Secondary | ICD-10-CM | POA: Diagnosis not present

## 2022-01-12 DIAGNOSIS — D649 Anemia, unspecified: Secondary | ICD-10-CM | POA: Diagnosis not present

## 2022-01-12 DIAGNOSIS — R1013 Epigastric pain: Secondary | ICD-10-CM

## 2022-01-12 DIAGNOSIS — H0102B Squamous blepharitis left eye, upper and lower eyelids: Secondary | ICD-10-CM | POA: Diagnosis not present

## 2022-01-12 DIAGNOSIS — E785 Hyperlipidemia, unspecified: Secondary | ICD-10-CM | POA: Diagnosis not present

## 2022-01-12 DIAGNOSIS — H02831 Dermatochalasis of right upper eyelid: Secondary | ICD-10-CM | POA: Diagnosis not present

## 2022-01-12 DIAGNOSIS — H16223 Keratoconjunctivitis sicca, not specified as Sjogren's, bilateral: Secondary | ICD-10-CM | POA: Diagnosis not present

## 2022-01-12 DIAGNOSIS — H02834 Dermatochalasis of left upper eyelid: Secondary | ICD-10-CM | POA: Diagnosis not present

## 2022-01-12 DIAGNOSIS — H1045 Other chronic allergic conjunctivitis: Secondary | ICD-10-CM | POA: Diagnosis not present

## 2022-01-12 DIAGNOSIS — H2513 Age-related nuclear cataract, bilateral: Secondary | ICD-10-CM | POA: Diagnosis not present

## 2022-01-13 LAB — COMPREHENSIVE METABOLIC PANEL
ALT: 37 IU/L — ABNORMAL HIGH (ref 0–32)
AST: 30 IU/L (ref 0–40)
Albumin/Globulin Ratio: 1.1 — ABNORMAL LOW (ref 1.2–2.2)
Albumin: 4 g/dL (ref 3.8–4.8)
Alkaline Phosphatase: 132 IU/L — ABNORMAL HIGH (ref 44–121)
BUN/Creatinine Ratio: 10 — ABNORMAL LOW (ref 12–28)
BUN: 7 mg/dL — ABNORMAL LOW (ref 8–27)
Bilirubin Total: 0.5 mg/dL (ref 0.0–1.2)
CO2: 24 mmol/L (ref 20–29)
Calcium: 9.2 mg/dL (ref 8.7–10.3)
Chloride: 106 mmol/L (ref 96–106)
Creatinine, Ser: 0.68 mg/dL (ref 0.57–1.00)
Globulin, Total: 3.7 g/dL (ref 1.5–4.5)
Glucose: 168 mg/dL — ABNORMAL HIGH (ref 70–99)
Potassium: 4 mmol/L (ref 3.5–5.2)
Sodium: 142 mmol/L (ref 134–144)
Total Protein: 7.7 g/dL (ref 6.0–8.5)
eGFR: 99 mL/min/{1.73_m2} (ref >59)

## 2022-01-13 LAB — CBC
Hematocrit: 33.3 % — ABNORMAL LOW (ref 34.0–46.6)
Hemoglobin: 11.1 g/dL (ref 11.1–15.9)
MCH: 28.3 pg (ref 26.6–33.0)
MCHC: 33.3 g/dL (ref 31.5–35.7)
MCV: 85 fL (ref 79–97)
Platelets: 222 10*3/uL (ref 150–450)
RBC: 3.92 x10E6/uL (ref 3.77–5.28)
RDW: 13.7 % (ref 11.7–15.4)
WBC: 6.2 10*3/uL (ref 3.4–10.8)

## 2022-01-13 LAB — LIPASE: Lipase: 59 U/L (ref 14–72)

## 2022-01-13 NOTE — Addendum Note (Signed)
Addended by: Jonah Blue B on: 01/13/2022 07:34 AM   Modules accepted: Orders

## 2022-01-15 LAB — IRON,TIBC AND FERRITIN PANEL
Ferritin: 98 ng/mL (ref 15–150)
Iron Saturation: 32 % (ref 15–55)
Iron: 88 ug/dL (ref 27–139)
Total Iron Binding Capacity: 273 ug/dL (ref 250–450)
UIBC: 185 ug/dL (ref 118–369)

## 2022-01-15 LAB — SPECIMEN STATUS REPORT

## 2022-01-16 DIAGNOSIS — R109 Unspecified abdominal pain: Secondary | ICD-10-CM | POA: Diagnosis not present

## 2022-01-16 DIAGNOSIS — Z7951 Long term (current) use of inhaled steroids: Secondary | ICD-10-CM | POA: Diagnosis not present

## 2022-01-16 DIAGNOSIS — E785 Hyperlipidemia, unspecified: Secondary | ICD-10-CM | POA: Diagnosis not present

## 2022-01-16 DIAGNOSIS — E119 Type 2 diabetes mellitus without complications: Secondary | ICD-10-CM | POA: Diagnosis not present

## 2022-01-16 DIAGNOSIS — D649 Anemia, unspecified: Secondary | ICD-10-CM | POA: Diagnosis not present

## 2022-01-16 DIAGNOSIS — Z9049 Acquired absence of other specified parts of digestive tract: Secondary | ICD-10-CM | POA: Diagnosis not present

## 2022-01-16 DIAGNOSIS — M069 Rheumatoid arthritis, unspecified: Secondary | ICD-10-CM | POA: Diagnosis not present

## 2022-01-16 DIAGNOSIS — R110 Nausea: Secondary | ICD-10-CM | POA: Diagnosis not present

## 2022-01-16 DIAGNOSIS — R1013 Epigastric pain: Secondary | ICD-10-CM | POA: Diagnosis not present

## 2022-01-16 DIAGNOSIS — F17210 Nicotine dependence, cigarettes, uncomplicated: Secondary | ICD-10-CM | POA: Diagnosis not present

## 2022-01-16 DIAGNOSIS — Z720 Tobacco use: Secondary | ICD-10-CM | POA: Diagnosis not present

## 2022-01-16 DIAGNOSIS — K219 Gastro-esophageal reflux disease without esophagitis: Secondary | ICD-10-CM | POA: Diagnosis not present

## 2022-01-16 DIAGNOSIS — Z794 Long term (current) use of insulin: Secondary | ICD-10-CM | POA: Diagnosis not present

## 2022-01-16 DIAGNOSIS — K5900 Constipation, unspecified: Secondary | ICD-10-CM | POA: Diagnosis not present

## 2022-01-16 DIAGNOSIS — N2889 Other specified disorders of kidney and ureter: Secondary | ICD-10-CM | POA: Diagnosis not present

## 2022-01-17 DIAGNOSIS — E119 Type 2 diabetes mellitus without complications: Secondary | ICD-10-CM | POA: Diagnosis not present

## 2022-01-17 DIAGNOSIS — E785 Hyperlipidemia, unspecified: Secondary | ICD-10-CM | POA: Diagnosis not present

## 2022-01-17 DIAGNOSIS — J45909 Unspecified asthma, uncomplicated: Secondary | ICD-10-CM | POA: Diagnosis not present

## 2022-01-17 DIAGNOSIS — K219 Gastro-esophageal reflux disease without esophagitis: Secondary | ICD-10-CM | POA: Diagnosis not present

## 2022-01-17 DIAGNOSIS — R1013 Epigastric pain: Secondary | ICD-10-CM | POA: Diagnosis not present

## 2022-01-18 DIAGNOSIS — E119 Type 2 diabetes mellitus without complications: Secondary | ICD-10-CM | POA: Diagnosis not present

## 2022-01-18 DIAGNOSIS — K219 Gastro-esophageal reflux disease without esophagitis: Secondary | ICD-10-CM | POA: Diagnosis not present

## 2022-01-18 DIAGNOSIS — M069 Rheumatoid arthritis, unspecified: Secondary | ICD-10-CM | POA: Diagnosis not present

## 2022-01-18 DIAGNOSIS — E785 Hyperlipidemia, unspecified: Secondary | ICD-10-CM | POA: Diagnosis not present

## 2022-01-18 DIAGNOSIS — Z720 Tobacco use: Secondary | ICD-10-CM | POA: Diagnosis not present

## 2022-01-18 DIAGNOSIS — R1013 Epigastric pain: Secondary | ICD-10-CM | POA: Diagnosis not present

## 2022-01-19 ENCOUNTER — Encounter: Payer: Self-pay | Admitting: Internal Medicine

## 2022-01-19 ENCOUNTER — Telehealth: Payer: Self-pay | Admitting: Internal Medicine

## 2022-01-19 DIAGNOSIS — Z1211 Encounter for screening for malignant neoplasm of colon: Secondary | ICD-10-CM

## 2022-01-19 NOTE — Progress Notes (Signed)
I received patient discharge summary packet that was given to her when she left the hospital at [facility] in [place] in January of this year.  This does not include a discharge summary.  This is the discharge packet that was given to her.  I see a primary diagnosis of acute pancreatitis.  Amylase was 716, lipase 5763,creatinine 0.6, Patient had elevation of AST and ALT of 229/202/alkaline phosphatase 555 Platelet count 350 H/H11.8/36.5 CT abdomen/pelvis without contrast.  Impression: Unremarkable enhanced abdominal evaluation status postcholecystectomy.  Mild mesenteric lymphadenitis.  No free fluid or focal inflammation involving the pelvis. I did not see an EGD report.  In looking through her record on epic at this time, I see that she went to [facility] in [facility] 01/16/2022 with abdominal pain in the epigastric area.  Lipase level mildly elevated at 80. LFTs: ALT 45, alkaline phosphatase normal, AST normal. CBC: H/H 10.2/30.9, MCV 85.  Iron studies consistent with anemia of chronic disease. CT of the abdomen negative for any acute findings. EGD normal.  Colonoscopy recommended for colon cancer screening.

## 2022-01-20 ENCOUNTER — Telehealth: Payer: Self-pay

## 2022-01-20 DIAGNOSIS — K2950 Unspecified chronic gastritis without bleeding: Secondary | ICD-10-CM | POA: Diagnosis not present

## 2022-01-20 NOTE — Telephone Encounter (Signed)
[employer] interpreters [place]  Id# [phone]  contacted pt to go over lab results pt wasn't available spoke with pt daughter and made aware of results and Dr. Wynetta Emery message in regards to hospital note. Pt daughter doesn't have any questions or concerns

## 2022-01-20 NOTE — Telephone Encounter (Signed)
[employer] [place]  Id# [id number]  contacted pt to go over lab results pt wasn't available spoke with pt daughter and made aware of results and Dr. Johnson message in regards to hospital note. Pt daughter doesn't have any questions or concerns  °

## 2022-01-28 ENCOUNTER — Ambulatory Visit: Payer: Medicare Other | Attending: Internal Medicine

## 2022-01-28 ENCOUNTER — Other Ambulatory Visit: Payer: Self-pay

## 2022-01-28 ENCOUNTER — Ambulatory Visit
Admission: RE | Admit: 2022-01-28 | Discharge: 2022-01-28 | Disposition: A | Discharge status: Home / Self Care | Payer: Medicare Other | Source: Ambulatory Visit | Attending: Internal Medicine | Admitting: Internal Medicine

## 2022-01-28 DIAGNOSIS — Z1231 Encounter for screening mammogram for malignant neoplasm of breast: Secondary | ICD-10-CM | POA: Diagnosis not present

## 2022-02-25 ENCOUNTER — Other Ambulatory Visit: Payer: Self-pay

## 2022-03-08 ENCOUNTER — Ambulatory Visit: Payer: Medicare Other | Admitting: Internal Medicine

## 2022-03-17 ENCOUNTER — Other Ambulatory Visit: Payer: Self-pay

## 2022-04-06 ENCOUNTER — Encounter: Payer: Self-pay | Admitting: Physician Assistant

## 2022-04-06 ENCOUNTER — Other Ambulatory Visit: Payer: Self-pay

## 2022-04-06 ENCOUNTER — Ambulatory Visit: Payer: Medicare Other | Attending: Physician Assistant | Admitting: Physician Assistant

## 2022-04-06 VITALS — BP 106/87 | HR 74 | Temp 98.8°F | Resp 18 | Ht 60.0 in | Wt 161.0 lb

## 2022-04-06 DIAGNOSIS — Z7984 Long term (current) use of oral hypoglycemic drugs: Secondary | ICD-10-CM | POA: Diagnosis not present

## 2022-04-06 DIAGNOSIS — K219 Gastro-esophageal reflux disease without esophagitis: Secondary | ICD-10-CM

## 2022-04-06 DIAGNOSIS — Z789 Other specified health status: Secondary | ICD-10-CM

## 2022-04-06 DIAGNOSIS — E785 Hyperlipidemia, unspecified: Secondary | ICD-10-CM

## 2022-04-06 DIAGNOSIS — E1169 Type 2 diabetes mellitus with other specified complication: Secondary | ICD-10-CM

## 2022-04-06 DIAGNOSIS — E1165 Type 2 diabetes mellitus with hyperglycemia: Secondary | ICD-10-CM | POA: Diagnosis not present

## 2022-04-06 DIAGNOSIS — R1013 Epigastric pain: Secondary | ICD-10-CM

## 2022-04-06 DIAGNOSIS — Z79899 Other long term (current) drug therapy: Secondary | ICD-10-CM | POA: Insufficient documentation

## 2022-04-06 LAB — GLUCOSE, POCT (MANUAL RESULT ENTRY): POC Glucose: 106 mg/dl — AB (ref 70–99)

## 2022-04-06 LAB — POCT GLYCOSYLATED HEMOGLOBIN (HGB A1C): Hemoglobin A1C: 7.5 % — AB (ref 4.0–5.6)

## 2022-04-06 MED ORDER — OMEPRAZOLE 20 MG PO CPDR
20.0000 mg | DELAYED_RELEASE_CAPSULE | Freq: Every day | ORAL | 3 refills | Status: DC
  Filled 2022-04-06: qty 30, 30d supply, fill #0
  Filled 2022-05-12: qty 30, 30d supply, fill #1
  Filled 2023-03-10 – 2023-03-17 (×2): qty 30, 30d supply, fill #2

## 2022-04-06 MED ORDER — JANUMET 50-1000 MG PO TABS
1.0000 | ORAL_TABLET | Freq: Two times a day (BID) | ORAL | 1 refills | Status: DC
  Filled 2022-04-06: qty 180, 90d supply, fill #0
  Filled 2022-07-14: qty 180, 90d supply, fill #1

## 2022-04-06 MED ORDER — GLIPIZIDE 5 MG PO TABS
5.0000 mg | ORAL_TABLET | Freq: Every day | ORAL | 1 refills | Status: DC
  Filled 2022-04-06: qty 90, 90d supply, fill #0

## 2022-04-06 MED ORDER — CETIRIZINE HCL 10 MG PO TABS
10.0000 mg | ORAL_TABLET | Freq: Every day | ORAL | 3 refills | Status: AC | PRN
  Filled 2022-04-06 – 2023-03-10 (×2): qty 90, 90d supply, fill #0

## 2022-04-06 MED ORDER — ATORVASTATIN CALCIUM 20 MG PO TABS
20.0000 mg | ORAL_TABLET | Freq: Every day | ORAL | 1 refills | Status: DC
  Filled 2022-04-06: qty 90, 90d supply, fill #0

## 2022-04-06 NOTE — Progress Notes (Signed)
? ?Natalie Cherry, is a 61 y.o. female ? ?OVF:[id number] ? ?ACZ:[id number] ? ?DOB - Mar 09, 1961 ? ?No chief complaint on file. ?    ? ?Subjective:  ? ?Natalie Cherry is a 61 y.o. female here today for med RF.  No issues or concerns today.  Compliant with diabetes meds.  Does not always follow diabetic diet or checking blood sugars.  No hyper/hypo episodes.  Denies polyuria/polydipsia.  No new issues or concerns.   ?No problems updated. ? ?ALLERGIES: ?No Known Allergies ? ?PAST MEDICAL HISTORY: ?Past Medical History:  ?Diagnosis Date  ? Arthritis   ? Diabetes mellitus   ? Hypertension   ? ? ?MEDICATIONS AT HOME: ?Prior to Admission medications   ?Medication Sig Start Date End Date Taking? Authorizing Provider  ?Accu-Chek Softclix Lancets lancets Use as instructed to check blood sugar three times daily. E11.9 07/24/20  Yes Ladell Pier, MD  ?denosumab (PROLIA) 60 MG/ML SOSY injection Inject 60 mg into the skin every 6 (six) months.   Yes [provider]  ?diclofenac Sodium (VOLTAREN) 1 % GEL Apply 2 g topically 4 (four) times daily. 07/24/20  Yes Ladell Pier, MD  ?etanercept (ENBREL SURECLICK) 50 MG/ML injection INJECT 50MG SUBCUTANEOUSLY ONCE WEEKLY AS DIRECTED.   Yes [provider]  ?glipiZIDE (GLUCOTROL) 5 MG tablet Take 1 tablet (5 mg total) by mouth daily before breakfast. 04/06/22  Yes Bertice Risse M, PA-C  ?glucose blood (ACCU-CHEK GUIDE) test strip Use as instructed to check blood sugar three times daily. E11.9 09/02/20  Yes Ladell Pier, MD  ?hydroxychloroquine (PLAQUENIL) 200 MG tablet Take 200 mg by mouth daily.  01/08/14  Yes [provider]  ?Insulin Pen Needle (PEN NEEDLES) 31G X 5 MM MISC 1 Dose by Does not apply route daily. 10/15/20  Yes Minette Brine, Amy J, NP  ?atorvastatin (LIPITOR) 20 MG tablet Take 1 tablet (20 mg total) by mouth daily. 04/06/22   Argentina Donovan, PA-C  ?Blood Glucose Monitoring Suppl (ACCU-CHEK GUIDE) w/Device KIT 1 each by Does not apply route  daily. Use as instructed to check blood sugar three times daily. E11.9 ?Patient not taking: Reported on 04/06/2022 07/24/20   Ladell Pier, MD  ?cetirizine (ZYRTEC ALLERGY) 10 MG tablet Take 1 tablet (10 mg total) by mouth daily. Prn allergies 04/06/22   Argentina Donovan, PA-C  ?diphenhydrAMINE (BENADRYL) 50 MG capsule Take 50 mg by mouth every 6 (six) hours as needed. ?Patient not taking: Reported on 04/06/2022    [provider]  ?omeprazole (PRILOSEC) 20 MG capsule Take 1 capsule (20 mg total) by mouth daily. 04/06/22   Argentina Donovan, PA-C  ?sitaGLIPtin-metformin (JANUMET) 50-1000 MG tablet Take 1 tablet by mouth 2 (two) times daily with a meal. 04/06/22   Argentina Donovan, PA-C  ? ? ?ROS: ?Neg HEENT ?Neg resp ?Neg cardiac ?Neg GI ?Neg GU ?Neg MS ?Neg psych ?Neg neuro ? ?Objective:  ? ?Vitals:  ? 04/06/22 1041  ?BP: 106/87  ?Pulse: 74  ?Resp: 18  ?Temp: 98.8 ?F (37.1 ?C)  ?TempSrc: Oral  ?SpO2: 97%  ?Weight: 161 lb (73 kg)  ?Height: 5' (1.524 m)  ? ?Exam ?General appearance : Awake, alert, not in any distress. Speech Clear. Not toxic looking ?HEENT: Atraumatic and Normocephalic ?Neck: Supple, no JVD. No cervical lymphadenopathy.  ?Chest: Good air entry bilaterally, CTAB.  No rales/rhonchi/wheezing ?CVS: S1 S2 regular, no murmurs.  ?Extremities: B/L Lower Ext shows no edema, both legs are warm to touch ?Neurology: Awake alert,  and oriented X 3, CN II-XII intact, Non focal ?Skin: No Rash ? ?Data Review ?Lab Results  ?Component Value Date  ? HGBA1C 7.5 (A) 04/06/2022  ? HGBA1C 8.8 (A) 01/11/2022  ? HGBA1C 6.7 04/29/2021  ? ? ?Assessment & Plan  ? ?1. Uncontrolled type 2 diabetes mellitus with hyperglycemia (HCC) ?Improved but not at goal.  Add low dose glipizide once daily ?- Glucose (CBG) ?- HgB A1c ?- glipiZIDE (GLUCOTROL) 5 MG tablet; Take 1 tablet (5 mg total) by mouth daily before breakfast.  Dispense: 90 tablet; Refill: 1 ?- atorvastatin (LIPITOR) 20 MG tablet; Take 1 tablet (20 mg total) by  mouth daily.  Dispense: 90 tablet; Refill: 1 ?- sitaGLIPtin-metformin (JANUMET) 50-1000 MG tablet; Take 1 tablet by mouth 2 (two) times daily with a meal.  Dispense: 180 tablet; Refill: 1 ? ?2. Type 2 diabetes mellitus with hyperlipidemia (HCC) ?- Comprehensive metabolic panel ?- atorvastatin (LIPITOR) 20 MG tablet; Take 1 tablet (20 mg total) by mouth daily.  Dispense: 90 tablet; Refill: 1 ?- sitaGLIPtin-metformin (JANUMET) 50-1000 MG tablet; Take 1 tablet by mouth 2 (two) times daily with a meal.  Dispense: 180 tablet; Refill: 1 ? ?3. Gastroesophageal reflux disease without esophagitis ?- Comprehensive metabolic panel ?- omeprazole (PRILOSEC) 20 MG capsule; Take 1 capsule (20 mg total) by mouth daily.  Dispense: 30 capsule; Refill: 3 ? ?4. Hyperlipidemia associated with type 2 diabetes mellitus (HCC) ?- Lipid panel ?- atorvastatin (LIPITOR) 20 MG tablet; Take 1 tablet (20 mg total) by mouth daily.  Dispense: 90 tablet; Refill: 1 ? ?5. Epigastric pain ?Resolved/no pain ?- Comprehensive metabolic panel ?- omeprazole (PRILOSEC) 20 MG capsule; Take 1 capsule (20 mg total) by mouth daily.  Dispense: 30 capsule; Refill: 3 ? ?6. Language barrier ?[employer] interpreters used "[place]" and additional time performing visit was required. ? ? ?Return in about 3 months (around 07/07/2022) for PCP in 3 months for chronic conditions. ? ?The patient was given clear instructions to go to ER or return to medical center if symptoms don't improve, worsen or new problems develop. The patient verbalized understanding. The patient was told to call to get lab results if they haven't heard anything in the next week.  ? ? ? ? ? , PA-C ?[facility] ?[place], [place] ?[phone]   ?04/06/2022, 11:16 AM Patient ID: Natalie Cherry, female   DOB: 11/30/1960, 60 y.o.   MRN: [id number] ? ?

## 2022-04-06 NOTE — Progress Notes (Signed)
Patient has not eaten or taken medication today. ?Patient denies pain at this time. ?Patient request med refill. ?

## 2022-04-07 LAB — LIPID PANEL
Chol/HDL Ratio: 4.1 ratio (ref 0.0–4.4)
Cholesterol, Total: 154 mg/dL (ref 100–199)
HDL: 38 mg/dL — ABNORMAL LOW (ref >39)
LDL Chol Calc (NIH): 90 mg/dL (ref 0–99)
Triglycerides: 149 mg/dL (ref 0–149)
VLDL Cholesterol Cal: 26 mg/dL (ref 5–40)

## 2022-04-07 LAB — COMPREHENSIVE METABOLIC PANEL
ALT: 54 IU/L — ABNORMAL HIGH (ref 0–32)
AST: 44 IU/L — ABNORMAL HIGH (ref 0–40)
Albumin/Globulin Ratio: 1.1 — ABNORMAL LOW (ref 1.2–2.2)
Albumin: 4.2 g/dL (ref 3.8–4.8)
Alkaline Phosphatase: 136 IU/L — ABNORMAL HIGH (ref 44–121)
BUN/Creatinine Ratio: 19 (ref 12–28)
BUN: 13 mg/dL (ref 8–27)
Bilirubin Total: 0.2 mg/dL (ref 0.0–1.2)
CO2: 23 mmol/L (ref 20–29)
Calcium: 9.3 mg/dL (ref 8.7–10.3)
Chloride: 105 mmol/L (ref 96–106)
Creatinine, Ser: 0.67 mg/dL (ref 0.57–1.00)
Globulin, Total: 3.7 g/dL (ref 1.5–4.5)
Glucose: 149 mg/dL — ABNORMAL HIGH (ref 70–99)
Potassium: 4.2 mmol/L (ref 3.5–5.2)
Sodium: 140 mmol/L (ref 134–144)
Total Protein: 7.9 g/dL (ref 6.0–8.5)
eGFR: 99 mL/min/{1.73_m2} (ref >59)

## 2022-04-20 DIAGNOSIS — M25532 Pain in left wrist: Secondary | ICD-10-CM | POA: Diagnosis not present

## 2022-04-20 DIAGNOSIS — M810 Age-related osteoporosis without current pathological fracture: Secondary | ICD-10-CM | POA: Diagnosis not present

## 2022-04-20 DIAGNOSIS — M25512 Pain in left shoulder: Secondary | ICD-10-CM | POA: Diagnosis not present

## 2022-04-20 DIAGNOSIS — E785 Hyperlipidemia, unspecified: Secondary | ICD-10-CM | POA: Diagnosis not present

## 2022-04-20 DIAGNOSIS — M25522 Pain in left elbow: Secondary | ICD-10-CM | POA: Diagnosis not present

## 2022-04-20 DIAGNOSIS — Z79899 Other long term (current) drug therapy: Secondary | ICD-10-CM | POA: Diagnosis not present

## 2022-04-20 DIAGNOSIS — I10 Essential (primary) hypertension: Secondary | ICD-10-CM | POA: Diagnosis not present

## 2022-04-20 DIAGNOSIS — E119 Type 2 diabetes mellitus without complications: Secondary | ICD-10-CM | POA: Diagnosis not present

## 2022-04-20 DIAGNOSIS — M1990 Unspecified osteoarthritis, unspecified site: Secondary | ICD-10-CM | POA: Diagnosis not present

## 2022-04-20 DIAGNOSIS — M069 Rheumatoid arthritis, unspecified: Secondary | ICD-10-CM | POA: Diagnosis not present

## 2022-04-27 ENCOUNTER — Encounter: Payer: Self-pay | Admitting: Internal Medicine

## 2022-04-27 NOTE — Progress Notes (Signed)
Patient was seen by [facility] 04/20/2022 by Dr. Dossie Der.  Patient with diagnosis of rheumatoid arthritis, osteoarthritis, osteoporosis. Labs: ESR 59 CBC: H/H11.5/35, platelet count 339, WBC 7.6 Albumin 0.9.  Other LFTs normal. Creatinine 0.82, GFR 77 for white/89 for African-American Patient on Prolia injection every 6 months for osteoporosis.

## 2022-05-04 DIAGNOSIS — M810 Age-related osteoporosis without current pathological fracture: Secondary | ICD-10-CM | POA: Diagnosis not present

## 2022-05-12 ENCOUNTER — Other Ambulatory Visit: Payer: Self-pay

## 2022-07-14 ENCOUNTER — Other Ambulatory Visit: Payer: Self-pay

## 2022-07-19 ENCOUNTER — Other Ambulatory Visit: Payer: Self-pay

## 2022-07-22 ENCOUNTER — Ambulatory Visit: Payer: Medicare Other | Admitting: Internal Medicine

## 2022-07-30 DIAGNOSIS — R7989 Other specified abnormal findings of blood chemistry: Secondary | ICD-10-CM | POA: Diagnosis not present

## 2022-07-30 DIAGNOSIS — R1011 Right upper quadrant pain: Secondary | ICD-10-CM | POA: Diagnosis not present

## 2022-07-30 DIAGNOSIS — N3289 Other specified disorders of bladder: Secondary | ICD-10-CM | POA: Diagnosis not present

## 2022-07-30 DIAGNOSIS — K838 Other specified diseases of biliary tract: Secondary | ICD-10-CM | POA: Diagnosis not present

## 2022-07-30 DIAGNOSIS — E785 Hyperlipidemia, unspecified: Secondary | ICD-10-CM | POA: Diagnosis not present

## 2022-07-30 DIAGNOSIS — K851 Biliary acute pancreatitis without necrosis or infection: Secondary | ICD-10-CM | POA: Diagnosis not present

## 2022-07-30 DIAGNOSIS — E669 Obesity, unspecified: Secondary | ICD-10-CM | POA: Diagnosis not present

## 2022-07-30 DIAGNOSIS — Z79899 Other long term (current) drug therapy: Secondary | ICD-10-CM | POA: Diagnosis not present

## 2022-07-30 DIAGNOSIS — K859 Acute pancreatitis without necrosis or infection, unspecified: Secondary | ICD-10-CM | POA: Diagnosis not present

## 2022-07-30 DIAGNOSIS — M069 Rheumatoid arthritis, unspecified: Secondary | ICD-10-CM | POA: Diagnosis not present

## 2022-07-30 DIAGNOSIS — K8500 Idiopathic acute pancreatitis without necrosis or infection: Secondary | ICD-10-CM | POA: Diagnosis not present

## 2022-07-30 DIAGNOSIS — F17210 Nicotine dependence, cigarettes, uncomplicated: Secondary | ICD-10-CM | POA: Diagnosis not present

## 2022-07-30 DIAGNOSIS — K219 Gastro-esophageal reflux disease without esophagitis: Secondary | ICD-10-CM | POA: Diagnosis not present

## 2022-07-30 DIAGNOSIS — E119 Type 2 diabetes mellitus without complications: Secondary | ICD-10-CM | POA: Diagnosis not present

## 2022-07-30 DIAGNOSIS — I440 Atrioventricular block, first degree: Secondary | ICD-10-CM | POA: Diagnosis not present

## 2022-07-30 DIAGNOSIS — K8050 Calculus of bile duct without cholangitis or cholecystitis without obstruction: Secondary | ICD-10-CM | POA: Diagnosis not present

## 2022-07-30 DIAGNOSIS — Z9049 Acquired absence of other specified parts of digestive tract: Secondary | ICD-10-CM | POA: Diagnosis not present

## 2022-07-30 DIAGNOSIS — Z90710 Acquired absence of both cervix and uterus: Secondary | ICD-10-CM | POA: Diagnosis not present

## 2022-07-30 DIAGNOSIS — R935 Abnormal findings on diagnostic imaging of other abdominal regions, including retroperitoneum: Secondary | ICD-10-CM | POA: Diagnosis not present

## 2022-07-30 DIAGNOSIS — R748 Abnormal levels of other serum enzymes: Secondary | ICD-10-CM | POA: Diagnosis not present

## 2022-07-30 DIAGNOSIS — N3000 Acute cystitis without hematuria: Secondary | ICD-10-CM | POA: Diagnosis not present

## 2022-07-30 DIAGNOSIS — J45909 Unspecified asthma, uncomplicated: Secondary | ICD-10-CM | POA: Diagnosis not present

## 2022-07-30 DIAGNOSIS — K8689 Other specified diseases of pancreas: Secondary | ICD-10-CM | POA: Diagnosis not present

## 2022-07-31 DIAGNOSIS — E119 Type 2 diabetes mellitus without complications: Secondary | ICD-10-CM | POA: Diagnosis not present

## 2022-07-31 DIAGNOSIS — E785 Hyperlipidemia, unspecified: Secondary | ICD-10-CM | POA: Diagnosis not present

## 2022-07-31 DIAGNOSIS — K219 Gastro-esophageal reflux disease without esophagitis: Secondary | ICD-10-CM | POA: Diagnosis not present

## 2022-07-31 DIAGNOSIS — M069 Rheumatoid arthritis, unspecified: Secondary | ICD-10-CM | POA: Diagnosis not present

## 2022-07-31 DIAGNOSIS — K8500 Idiopathic acute pancreatitis without necrosis or infection: Secondary | ICD-10-CM | POA: Diagnosis not present

## 2022-08-01 DIAGNOSIS — K8500 Idiopathic acute pancreatitis without necrosis or infection: Secondary | ICD-10-CM | POA: Diagnosis not present

## 2022-08-01 DIAGNOSIS — K219 Gastro-esophageal reflux disease without esophagitis: Secondary | ICD-10-CM | POA: Diagnosis not present

## 2022-08-01 DIAGNOSIS — M069 Rheumatoid arthritis, unspecified: Secondary | ICD-10-CM | POA: Diagnosis not present

## 2022-08-01 DIAGNOSIS — E119 Type 2 diabetes mellitus without complications: Secondary | ICD-10-CM | POA: Diagnosis not present

## 2022-08-01 DIAGNOSIS — E785 Hyperlipidemia, unspecified: Secondary | ICD-10-CM | POA: Diagnosis not present

## 2022-08-02 DIAGNOSIS — E119 Type 2 diabetes mellitus without complications: Secondary | ICD-10-CM | POA: Diagnosis not present

## 2022-08-02 DIAGNOSIS — K8500 Idiopathic acute pancreatitis without necrosis or infection: Secondary | ICD-10-CM | POA: Diagnosis not present

## 2022-08-03 DIAGNOSIS — K859 Acute pancreatitis without necrosis or infection, unspecified: Secondary | ICD-10-CM | POA: Diagnosis not present

## 2022-08-03 DIAGNOSIS — K8500 Idiopathic acute pancreatitis without necrosis or infection: Secondary | ICD-10-CM | POA: Diagnosis not present

## 2022-08-03 DIAGNOSIS — E119 Type 2 diabetes mellitus without complications: Secondary | ICD-10-CM | POA: Diagnosis not present

## 2022-08-04 DIAGNOSIS — E119 Type 2 diabetes mellitus without complications: Secondary | ICD-10-CM | POA: Diagnosis not present

## 2022-08-04 DIAGNOSIS — E785 Hyperlipidemia, unspecified: Secondary | ICD-10-CM | POA: Diagnosis not present

## 2022-08-04 DIAGNOSIS — K219 Gastro-esophageal reflux disease without esophagitis: Secondary | ICD-10-CM | POA: Diagnosis not present

## 2022-08-04 DIAGNOSIS — M069 Rheumatoid arthritis, unspecified: Secondary | ICD-10-CM | POA: Diagnosis not present

## 2022-08-04 DIAGNOSIS — K859 Acute pancreatitis without necrosis or infection, unspecified: Secondary | ICD-10-CM | POA: Diagnosis not present

## 2022-08-05 DIAGNOSIS — K219 Gastro-esophageal reflux disease without esophagitis: Secondary | ICD-10-CM | POA: Diagnosis not present

## 2022-08-05 DIAGNOSIS — M069 Rheumatoid arthritis, unspecified: Secondary | ICD-10-CM | POA: Diagnosis not present

## 2022-08-05 DIAGNOSIS — E785 Hyperlipidemia, unspecified: Secondary | ICD-10-CM | POA: Diagnosis not present

## 2022-08-05 DIAGNOSIS — K859 Acute pancreatitis without necrosis or infection, unspecified: Secondary | ICD-10-CM | POA: Diagnosis not present

## 2022-08-05 DIAGNOSIS — E119 Type 2 diabetes mellitus without complications: Secondary | ICD-10-CM | POA: Diagnosis not present

## 2022-08-06 DIAGNOSIS — M069 Rheumatoid arthritis, unspecified: Secondary | ICD-10-CM | POA: Diagnosis not present

## 2022-08-06 DIAGNOSIS — K859 Acute pancreatitis without necrosis or infection, unspecified: Secondary | ICD-10-CM | POA: Diagnosis not present

## 2022-08-06 DIAGNOSIS — K219 Gastro-esophageal reflux disease without esophagitis: Secondary | ICD-10-CM | POA: Diagnosis not present

## 2022-08-06 DIAGNOSIS — E119 Type 2 diabetes mellitus without complications: Secondary | ICD-10-CM | POA: Diagnosis not present

## 2022-08-06 DIAGNOSIS — E785 Hyperlipidemia, unspecified: Secondary | ICD-10-CM | POA: Diagnosis not present

## 2022-08-08 ENCOUNTER — Telehealth: Payer: Self-pay | Admitting: *Deleted

## 2022-08-08 NOTE — Patient Outreach (Signed)
  Care Coordination [facility] Note Transition Care Management Unsuccessful Follow-up Telephone Call  Date of discharge and from where:  [facility] [phone]  Attempts:  1st Attempt with [employer] # [phone]  Reason for unsuccessful TCM follow-up call:  Left voice message  Gean Maidens BSN RN [facility] [phone]

## 2022-08-09 ENCOUNTER — Telehealth: Payer: Self-pay | Admitting: *Deleted

## 2022-08-09 NOTE — Patient Outreach (Signed)
  Care Coordination [facility] Note Transition Care Management Unsuccessful Follow-up Telephone Call  Date of discharge and from where:  [facility] [phone]  Attempts:  2nd Attempt Used [employer]pre[employer] # [phone]  Reason for unsuccessful TCM follow-up call:  Left voice message  Gean Maidens BSN RN [facility] [phone]

## 2022-08-10 ENCOUNTER — Telehealth: Payer: Self-pay | Admitting: *Deleted

## 2022-08-10 NOTE — Patient Outreach (Addendum)
  Care Coordination [facility] Note Transition Care Management Follow-up Telephone Call  [employer] interpreter [phone] Date of discharge and from where: [facility] [phone] How have you been since you were released from the hospital? A little pain but much better Any questions or concerns? No  Items Reviewed: Did the pt receive and understand the discharge instructions provided? Yes  Medications obtained and verified? Yes  Other? No  Any new allergies since your discharge? No  Dietary orders reviewed? Yes Do you have support at home? Yes   Home Care and Equipment/Supplies: Were home health services ordered? no If so, what is the name of the agency? N/a  Has the agency set up a time to come to the patient's home? no Were any new equipment or medical supplies ordered?  No What is the name of the medical supply agency? N/a Were you able to get the supplies/equipment? no Do you have any questions related to the use of the equipment or supplies? No  Functional Questionnaire: (I = Independent and D = Dependent) ADLs: I  Bathing/Dressing- I  Meal Prep- D  Eating- I  Maintaining continence- I  Transferring/Ambulation- I  Managing Meds- I  Follow up appointments reviewed:  PCP Hospital f/u appt confirmed?N Daughter refused RN assistance in getting an  appointment Specialist Hospital f/u appt confirmed? No   Are transportation arrangements needed? No  If their condition worsens, is the pt aware to call PCP or go to the Emergency Dept.? Yes Was the patient provided with contact information for the PCP's office or ED? Yes Was to pt encouraged to call back with questions or concerns? Yes  SDOH assessments and interventions completed:   Yes  Care Coordination Interventions Activated:  Yes   Care Coordination Interventions:   N/a     Encounter Outcome:  Pt. Visit Completed    Gean Maidens BSN RN [facility] [phone]

## 2022-08-24 DIAGNOSIS — M810 Age-related osteoporosis without current pathological fracture: Secondary | ICD-10-CM | POA: Diagnosis not present

## 2022-08-24 DIAGNOSIS — E119 Type 2 diabetes mellitus without complications: Secondary | ICD-10-CM | POA: Diagnosis not present

## 2022-08-24 DIAGNOSIS — Z79899 Other long term (current) drug therapy: Secondary | ICD-10-CM | POA: Diagnosis not present

## 2022-08-24 DIAGNOSIS — I10 Essential (primary) hypertension: Secondary | ICD-10-CM | POA: Diagnosis not present

## 2022-08-24 DIAGNOSIS — M069 Rheumatoid arthritis, unspecified: Secondary | ICD-10-CM | POA: Diagnosis not present

## 2022-08-24 DIAGNOSIS — M1990 Unspecified osteoarthritis, unspecified site: Secondary | ICD-10-CM | POA: Diagnosis not present

## 2022-08-24 DIAGNOSIS — E785 Hyperlipidemia, unspecified: Secondary | ICD-10-CM | POA: Diagnosis not present

## 2022-10-03 ENCOUNTER — Other Ambulatory Visit: Payer: Self-pay

## 2022-10-03 ENCOUNTER — Encounter: Payer: Self-pay | Admitting: Internal Medicine

## 2022-10-03 ENCOUNTER — Ambulatory Visit: Payer: Medicare Other | Attending: Internal Medicine | Admitting: Internal Medicine

## 2022-10-03 VITALS — BP 124/74 | HR 76 | Ht 62.0 in | Wt 161.0 lb

## 2022-10-03 DIAGNOSIS — Z79899 Other long term (current) drug therapy: Secondary | ICD-10-CM | POA: Diagnosis not present

## 2022-10-03 DIAGNOSIS — Z6829 Body mass index (BMI) 29.0-29.9, adult: Secondary | ICD-10-CM | POA: Diagnosis not present

## 2022-10-03 DIAGNOSIS — M0579 Rheumatoid arthritis with rheumatoid factor of multiple sites without organ or systems involvement: Secondary | ICD-10-CM

## 2022-10-03 DIAGNOSIS — Z2821 Immunization not carried out because of patient refusal: Secondary | ICD-10-CM | POA: Diagnosis not present

## 2022-10-03 DIAGNOSIS — D649 Anemia, unspecified: Secondary | ICD-10-CM | POA: Diagnosis not present

## 2022-10-03 DIAGNOSIS — Z1331 Encounter for screening for depression: Secondary | ICD-10-CM | POA: Diagnosis not present

## 2022-10-03 DIAGNOSIS — Z8719 Personal history of other diseases of the digestive system: Secondary | ICD-10-CM | POA: Diagnosis not present

## 2022-10-03 DIAGNOSIS — G8929 Other chronic pain: Secondary | ICD-10-CM | POA: Insufficient documentation

## 2022-10-03 DIAGNOSIS — E1169 Type 2 diabetes mellitus with other specified complication: Secondary | ICD-10-CM

## 2022-10-03 DIAGNOSIS — Z760 Encounter for issue of repeat prescription: Secondary | ICD-10-CM | POA: Diagnosis not present

## 2022-10-03 DIAGNOSIS — Z2911 Encounter for prophylactic immunotherapy for respiratory syncytial virus (RSV): Secondary | ICD-10-CM

## 2022-10-03 DIAGNOSIS — Z6825 Body mass index (BMI) 25.0-25.9, adult: Secondary | ICD-10-CM | POA: Diagnosis not present

## 2022-10-03 DIAGNOSIS — E663 Overweight: Secondary | ICD-10-CM | POA: Insufficient documentation

## 2022-10-03 DIAGNOSIS — E785 Hyperlipidemia, unspecified: Secondary | ICD-10-CM | POA: Insufficient documentation

## 2022-10-03 DIAGNOSIS — M069 Rheumatoid arthritis, unspecified: Secondary | ICD-10-CM | POA: Diagnosis not present

## 2022-10-03 LAB — GLUCOSE, POCT (MANUAL RESULT ENTRY): POC Glucose: 219 mg/dl — AB (ref 70–99)

## 2022-10-03 MED ORDER — JANUMET 50-1000 MG PO TABS
1.0000 | ORAL_TABLET | Freq: Two times a day (BID) | ORAL | 1 refills | Status: DC
  Filled 2022-10-03: qty 180, 90d supply, fill #0
  Filled 2023-01-25: qty 180, 90d supply, fill #1

## 2022-10-03 MED ORDER — PEN NEEDLES 31G X 8 MM MISC
6 refills | Status: DC
  Filled 2022-10-03: qty 100, 25d supply, fill #0
  Filled 2023-03-10: qty 100, 25d supply, fill #1

## 2022-10-03 MED ORDER — LEVEMIR FLEXPEN 100 UNIT/ML ~~LOC~~ SOPN
8.0000 [IU] | PEN_INJECTOR | Freq: Every day | SUBCUTANEOUS | 11 refills | Status: DC
  Filled 2022-10-03: qty 9, 112d supply, fill #0
  Filled 2023-03-10: qty 9, 112d supply, fill #1
  Filled 2023-03-17: qty 3, 37d supply, fill #1

## 2022-10-03 MED ORDER — FREESTYLE LIBRE 2 READER DEVI
0 refills | Status: DC
  Filled 2022-10-03 – 2023-03-10 (×2): qty 1, 30d supply, fill #0

## 2022-10-03 MED ORDER — FREESTYLE LIBRE SENSOR SYSTEM MISC
12 refills | Status: DC
  Filled 2022-10-03: qty 2, 30d supply, fill #0
  Filled 2023-03-10: qty 2, 28d supply, fill #0

## 2022-10-03 MED ORDER — ATORVASTATIN CALCIUM 20 MG PO TABS
20.0000 mg | ORAL_TABLET | Freq: Every day | ORAL | 1 refills | Status: DC
  Filled 2022-10-03: qty 90, 90d supply, fill #0
  Filled 2023-01-25: qty 90, 90d supply, fill #1

## 2022-10-03 MED ORDER — AREXVY 120 MCG/0.5ML IM SUSR
0.5000 mL | Freq: Once | INTRAMUSCULAR | 0 refills | Status: AC
  Filled 2022-10-03: qty 0.5, 1d supply, fill #0

## 2022-10-03 NOTE — Patient Instructions (Signed)
Stop glipizide. We have started you on insulin called Levemir 8 units at bedtime. Please ask your rheumatologist if it is okay for you to receive the shingles vaccine given that you are on Enbrel.  If it is okay for you to receive it, we can give it on your next visit

## 2022-10-03 NOTE — Progress Notes (Signed)
Patient ID: Natalie Cherry, female    DOB: 1961/05/25  MRN: [id number]  CC: Medication Refill   Subjective: Natalie Cherry is a 61 y.o. female who presents for chronic ds management. Her concerns today include:  Pt with hx of DM, HL, RA, osteoporosis on Prolia, acute pancreatitis  Pt hosp 07/2022 through [facility] for abdominal pain.  Found to have pancreatitis secondary to obstructed stone.  Had ERCP 08/06/22 for biliary sphincterectomy with ballon extraction.  Had cholecystecomy 5 yrs ago Worsening anemia during hosp with Hb 9 on dischg -Iron studies done earlier this year normal.  DM/overweigh:  Results for orders placed or performed in visit on 10/03/22  POCT glucose (manual entry)  Result Value Ref Range   POC Glucose 219 (A) 70 - 99 mg/dl  Last A1C 9.3 08/07/2022 Concern that BS high.  Does well with eating habits.  No sugary drinks  taking , Janumet 50/1000 mg BID and Glucotrol 5 mg daily.  Out of Glipizide 2 wks. -checks BS 3/wk 150-180.  Before running out of Glucotrol level was 130-140.   Compliant with Lipitor.  Needs RF  Pos dep screen:  pt scored 14 on PHQ9.  Marked pos for SI on screening but pt reports she misread the question.  No thoughts of harming self.  Reports she gets frustrated due to chronic pain associated with RA.  Does not feel she needs med or counseling at this time.  Reports good family support  RA/osteo roses: On Prolia every 6 months through her rheumatologist Dr. Dossie Der.  She also is on Enbrel and Plaquenil.  HM:  declines flu shot.  She is agreeable to receiving Shingrix.  She has not heard of RSV vaccine  Patient Active Problem List   Diagnosis Date Noted   Hyperlipidemia associated with type 2 diabetes mellitus ([place]) 07/24/2020   Over weight 07/24/2020   Influenza vaccination declined 07/24/2020   Osteoporosis without current pathological fracture 12/07/2018   Type 2 diabetes mellitus with hyperglycemia, without long-term current use of insulin  ([place]) 11/06/2015   Rheumatoid arthritis ([place]) 01/29/2014   Vitamin D deficiency 01/29/2014   DENTAL CARIES 03/02/2009   Osteoarthritis of spine 10/05/2007     Current Outpatient Medications on File Prior to Visit  Medication Sig Dispense Refill   cetirizine (ZYRTEC ALLERGY) 10 MG tablet Take 1 tablet (10 mg total) by mouth daily. Prn allergies 90 tablet 3   denosumab (PROLIA) 60 MG/ML SOSY injection Inject 60 mg into the skin every 6 (six) months.     omeprazole (PRILOSEC) 20 MG capsule Take 1 capsule (20 mg total) by mouth daily. 30 capsule 3   Accu-Chek Softclix Lancets lancets Use as instructed to check blood sugar three times daily. E11.9 (Patient not taking: Reported on 10/03/2022) 100 each 12   Blood Glucose Monitoring Suppl (ACCU-CHEK GUIDE) w/Device KIT 1 each by Does not apply route daily. Use as instructed to check blood sugar three times daily. E11.9 (Patient not taking: Reported on 04/06/2022) 1 kit 0   diclofenac Sodium (VOLTAREN) 1 % GEL Apply 2 g topically 4 (four) times daily. (Patient not taking: Reported on 10/03/2022) 100 g 2   etanercept (ENBREL SURECLICK) 50 MG/ML injection INJECT 50MG SUBCUTANEOUSLY ONCE WEEKLY AS DIRECTED. (Patient not taking: Reported on 10/03/2022)     glucose blood (ACCU-CHEK GUIDE) test strip Use as instructed to check blood sugar three times daily. E11.9 (Patient not taking: Reported on 10/03/2022) 100 each 2   hydroxychloroquine (PLAQUENIL) 200 MG tablet  Take 200 mg by mouth daily.  (Patient not taking: Reported on 10/03/2022)     No current facility-administered medications on file prior to visit.    No Known Allergies  Social History   Socioeconomic History   Marital status: Married    Spouse name: Not on file   Number of children: Not on file   Years of education: Not on file   Highest education level: Not on file  Occupational History   Not on file  Tobacco Use   Smoking status: Never   Smokeless tobacco: Never  Vaping Use   Vaping  Use: Never used  Substance and Sexual Activity   Alcohol use: No   Drug use: No   Sexual activity: Not Currently  Other Topics Concern   Not on file  Social History Narrative   Not on file   Social Determinants of Health   Financial Resource Strain: Not on file  Food Insecurity: Not on file  Transportation Needs: Not on file  Physical Activity: Not on file  Stress: Not on file  Social Connections: Not on file  Intimate Partner Violence: Not on file    Family History  Problem Relation Age of Onset   Diabetes Mother    Asthma Father    Colon cancer Neg Hx    Stomach cancer Neg Hx    Esophageal cancer Neg Hx    Pancreatic cancer Neg Hx    Liver disease Neg Hx     Past Surgical History:  Procedure Laterality Date   CHOLECYSTECTOMY     approx 6 yrs ago in [place]   COLONOSCOPY     approx 15 years ago in [place]    ROS: Review of Systems Negative except as stated above  PHYSICAL EXAM: BP 124/74 (BP Location: Left Arm, Patient Position: Sitting, Cuff Size: Normal)   Pulse 76   Ht _0  (1.575 m)   Wt 161 lb (73 kg)   SpO2 96%   BMI 29.45 kg/m   Wt Readings from Last 3 Encounters:  10/03/22 161 lb (73 kg)  04/06/22 161 lb (73 kg)  01/11/22 162 lb 12.8 oz (73.8 kg)    Physical Exam  General appearance - alert, well appearing, over weight older Hispanic female and in no distress Mental status - normal mood, behavior, speech, dress, motor activity, and thought processes Neck - supple, no significant adenopathy Chest - clear to auscultation, no wheezes, rales or rhonchi, symmetric air entry Heart - normal rate, regular rhythm, normal S1, S2, no murmurs, rubs, clicks or gallops Extremities - peripheral pulses normal, no pedal edema, no clubbing or cyanosis Diabetic Foot Exam - Simple   Simple Foot Form Diabetic Foot exam was performed with the following findings: Yes 10/03/2022 12:25 PM  Visual Inspection No deformities, no ulcerations, no other skin breakdown  bilaterally: Yes Sensation Testing Intact to touch and monofilament testing bilaterally: Yes Pulse Check Posterior Tibialis and Dorsalis pulse intact bilaterally: Yes Comments         Latest Ref Rng & Units 04/06/2022   11:02 AM 01/12/2022    9:34 AM 05/03/2021   10:09 AM  CMP  Glucose 70 - 99 mg/dL 149  168  132   BUN 8 - 27 mg/dL _1 Creatinine 0.57 - 1.00 mg/dL 0.67  0.68  0.67   Sodium 134 - 144 mmol/L 140  142  140   Potassium 3.5 - 5.2 mmol/L 4.2  4.0  4.3  Chloride 96 - 106 mmol/L 105  106  106   CO2 20 - 29 mmol/L _0 Calcium 8.7 - 10.3 mg/dL 9.3  9.2  9.4   Total Protein 6.0 - 8.5 g/dL 7.9  7.7  7.6   Total Bilirubin 0.0 - 1.2 mg/dL 0.2  0.5  0.2   Alkaline Phos 44 - 121 IU/L 136  132  105   AST 0 - 40 IU/L 44  30  24   ALT 0 - 32 IU/L 54  37  33    Lipid Panel     Component Value Date/Time   CHOL 154 04/06/2022 1102   TRIG 149 04/06/2022 1102   HDL 38 (L) 04/06/2022 1102   CHOLHDL 4.1 04/06/2022 1102   CHOLHDL 4.6 12/13/2016 1149   VLDL 41 (H) 12/13/2016 1149   LDLCALC 90 04/06/2022 1102    CBC    Component Value Date/Time   WBC 6.2 01/12/2022 0934   WBC 8.1 10/05/2020 1615   RBC 3.92 01/12/2022 0934   RBC 4.05 10/05/2020 1615   HGB 11.1 01/12/2022 0934   HCT 33.3 (L) 01/12/2022 0934   PLT 222 01/12/2022 0934   MCV 85 01/12/2022 0934   MCH 28.3 01/12/2022 0934   MCH 28.8 12/13/2016 1149   MCHC 33.3 01/12/2022 0934   MCHC 33.1 10/05/2020 1615   RDW 13.7 01/12/2022 0934   LYMPHSABS 2.3 10/05/2020 1615   MONOABS 0.4 10/05/2020 1615   EOSABS 0.2 10/05/2020 1615   BASOSABS 0.1 10/05/2020 1615    ASSESSMENT AND PLAN: 1. Type 2 diabetes mellitus with hyperlipidemia (HCC) Not at goal Discussed putting her on evening dose of Levemir and d/c Glipizide. Pt agreeable to this.  Continue Janumet.  Clinical pharmacist met with patient today to teach insulin administration and go over signs and symptoms of hypoglycemia and how to treat.   Follow-up with clinical pharmacist in 1 month with blood sugar readings. Patient also agreeable to trying continuous glucose monitor if covered by her insurance. - POCT glucose (manual entry) - atorvastatin (LIPITOR) 20 MG tablet; Take 1 tablet (20 mg total) by mouth daily.  Dispense: 90 tablet; Refill: 1 - sitaGLIPtin-metformin (JANUMET) 50-1000 MG tablet; Take 1 tablet by mouth 2 (two) times daily with a meal.  Dispense: 180 tablet; Refill: 1 - Ambulatory referral to Ophthalmology - Insulin Pen Needle (PEN NEEDLES) 31G X 8 MM MISC; Use as directed  Dispense: 100 each; Refill: 6  2. Overweight (BMI 25.0-29.9) Patient advised to eliminate sugary drinks from the diet, cut back on portion sizes especially of white carbohydrates, eat more white lean meat like chicken [place] and seafood instead of beef or pork and incorporate fresh fruits and vegetables into the diet daily.   3. Rheumatoid arthritis involving multiple sites with positive rheumatoid factor ([place]) Followed by rheumatology and is on Enbrel and Plaquenil.  4. History of acute pancreatitis Due to stone in the common bile duct which was extracted.  5. Normocytic anemia We will recheck CBC today with iron studies. - CBC - Iron, TIBC and Ferritin Panel  6. Positive depression screening Discussed depression and management. Patient does not feel she needs to be on medication at this time and declines referral for counseling.  She denies any suicidal ideation.  7. Influenza vaccination declined   8. Need for RSV immunization - RSV vaccine recomb adjuvanted (AREXVY) 120 MCG/0.5ML injection; Inject 0.5 mLs into the muscle once for 1 dose.  Dispense: 0.5 mL;  Refill: 0  Advised patient to find out from her rheumatologist whether it is okay for [place] to give the Shingrix vaccine with her being on Enbrel.  [employer] Language interpreter used during this encounter. #[id number] Partridge  Patient was given the opportunity to ask questions.  Patient  verbalized understanding of the plan and was able to repeat key elements of the plan.   This documentation was completed using Radio producer.  Any transcriptional errors are unintentional.  Orders Placed This Encounter  Procedures   CBC   Iron, TIBC and Ferritin Panel   Ambulatory referral to Ophthalmology   POCT glucose (manual entry)     Requested Prescriptions   Signed Prescriptions Disp Refills   insulin detemir (LEVEMIR FLEXPEN) 100 UNIT/ML FlexPen 15 mL 11    Sig: Inject 8 Units into the skin at bedtime.   atorvastatin (LIPITOR) 20 MG tablet 90 tablet 1    Sig: Take 1 tablet (20 mg total) by mouth daily.   sitaGLIPtin-metformin (JANUMET) 50-1000 MG tablet 180 tablet 1    Sig: Take 1 tablet by mouth 2 (two) times daily with a meal.   Continuous Blood Gluc Sensor (FREESTYLE LIBRE SENSOR SYSTEM) MISC 2 each 12    Sig: Change sensor Q 2 wks   Continuous Blood Gluc Receiver (FREESTYLE LIBRE 2 READER) DEVI 1 each 0    Sig: UAD   RSV vaccine recomb adjuvanted (AREXVY) 120 MCG/0.5ML injection 0.5 mL 0    Sig: Inject 0.5 mLs into the muscle once for 1 dose.   Insulin Pen Needle (PEN NEEDLES) 31G X 8 MM MISC 100 each 6    Sig: Use as directed    Return in about 4 months (around 02/01/2023) for Give appt with Lurena Joiner in 1 mth for BS check.  Schedule [facility] with CMA.  Karle Plumber, MD, FACP

## 2022-10-04 LAB — IRON,TIBC AND FERRITIN PANEL
Ferritin: 112 ng/mL (ref 15–150)
Iron Saturation: 37 % (ref 15–55)
Iron: 114 ug/dL (ref 27–139)
Total Iron Binding Capacity: 306 ug/dL (ref 250–450)
UIBC: 192 ug/dL (ref 118–369)

## 2022-10-04 LAB — CBC
Hematocrit: 37.5 % (ref 34.0–46.6)
Hemoglobin: 12.3 g/dL (ref 11.1–15.9)
MCH: 28.7 pg (ref 26.6–33.0)
MCHC: 32.8 g/dL (ref 31.5–35.7)
MCV: 87 fL (ref 79–97)
Platelets: 323 10*3/uL (ref 150–450)
RBC: 4.29 x10E6/uL (ref 3.77–5.28)
RDW: 13.9 % (ref 11.7–15.4)
WBC: 5.7 10*3/uL (ref 3.4–10.8)

## 2022-10-14 ENCOUNTER — Ambulatory Visit: Payer: Medicare Other | Attending: Internal Medicine

## 2022-10-17 ENCOUNTER — Telehealth: Payer: Self-pay

## 2022-10-17 NOTE — Patient Outreach (Signed)
  Care Coordination   10/17/2022 Name: Natalie Cherry MRN: [id number] DOB: 04/29/1961   Care Coordination Outreach Attempts:  An unsuccessful telephone outreach was attempted today to offer the patient information about available care coordination services as a benefit of their health plan.   Follow Up Plan:  Additional outreach attempts will be made to offer the patient care coordination information and services.   Encounter Outcome:  No Answer  Care Coordination Interventions Activated:  No   Care Coordination Interventions:  No, not indicated      Antionette Fairy, RN,BSN,CCM [facility] Care Management Telephonic Care Management Coordinator Direct Phone: [phone] Toll Free: [phone] Fax: [phone]

## 2022-10-25 ENCOUNTER — Telehealth: Payer: Self-pay

## 2022-10-25 NOTE — Patient Outreach (Signed)
  Care Coordination   10/25/2022 Name: Natalie Cherry MRN: [id number] DOB: 07/04/1961   Care Coordination Outreach Attempts:  A second unsuccessful outreach was attempted today to offer the patient with information about available care coordination services as a benefit of their health plan.     Follow Up Plan:  Additional outreach attempts will be made to offer the patient care coordination information and services.   Encounter Outcome:  No Answer   Care Coordination Interventions:  No, not indicated      Antionette Fairy, RN,BSN,CCM [facility] Care Management Telephonic Care Management Coordinator Direct Phone: [phone] Toll Free: [phone] Fax: [phone]

## 2022-11-10 ENCOUNTER — Ambulatory Visit: Payer: Medicare Other | Admitting: Pharmacist

## 2022-12-16 ENCOUNTER — Telehealth: Payer: Medicare Other

## 2022-12-23 ENCOUNTER — Telehealth: Payer: Medicare Other

## 2022-12-27 ENCOUNTER — Telehealth: Payer: Self-pay | Admitting: Internal Medicine

## 2022-12-27 NOTE — Telephone Encounter (Signed)
Left message for patient to call back and schedule Medicare Annual Wellness Visit (AWV) either virtually or phone Left  my jabber number [phone]   awvi 03/29/15 per [facility]

## 2023-01-25 ENCOUNTER — Other Ambulatory Visit: Payer: Self-pay

## 2023-01-31 ENCOUNTER — Other Ambulatory Visit: Payer: Self-pay

## 2023-01-31 ENCOUNTER — Ambulatory Visit: Payer: Self-pay | Admitting: Internal Medicine

## 2023-03-10 ENCOUNTER — Other Ambulatory Visit: Payer: Self-pay | Admitting: Internal Medicine

## 2023-03-10 ENCOUNTER — Other Ambulatory Visit (HOSPITAL_COMMUNITY): Payer: Self-pay

## 2023-03-10 ENCOUNTER — Other Ambulatory Visit: Payer: Self-pay

## 2023-03-10 DIAGNOSIS — Z794 Long term (current) use of insulin: Secondary | ICD-10-CM

## 2023-03-11 MED ORDER — ACCU-CHEK GUIDE W/DEVICE KIT
1.0000 | PACK | Freq: Every day | 0 refills | Status: DC
  Filled 2023-03-11 – 2023-07-27 (×2): qty 1, fill #0

## 2023-03-12 ENCOUNTER — Other Ambulatory Visit (HOSPITAL_COMMUNITY): Payer: Self-pay

## 2023-03-13 ENCOUNTER — Other Ambulatory Visit (HOSPITAL_COMMUNITY): Payer: Self-pay

## 2023-03-16 ENCOUNTER — Telehealth: Payer: Self-pay | Admitting: Internal Medicine

## 2023-03-16 ENCOUNTER — Other Ambulatory Visit (HOSPITAL_COMMUNITY): Payer: Self-pay

## 2023-03-16 NOTE — Telephone Encounter (Signed)
Called patient to schedule Medicare Annual Wellness Visit (AWV). Left message for patient to call back and schedule Medicare Annual Wellness Visit (AWV).  Last date of AWV: awvi 03/29/15 per [facility]    If any questions, please contact me at [phone].  Thank you ,  Rudell Cobb AWV direct phone # [phone]

## 2023-03-17 ENCOUNTER — Other Ambulatory Visit: Payer: Self-pay

## 2023-03-17 ENCOUNTER — Other Ambulatory Visit: Payer: Self-pay | Admitting: Internal Medicine

## 2023-03-17 ENCOUNTER — Other Ambulatory Visit: Payer: Self-pay | Admitting: Pharmacist

## 2023-03-17 DIAGNOSIS — K219 Gastro-esophageal reflux disease without esophagitis: Secondary | ICD-10-CM

## 2023-03-17 DIAGNOSIS — E1169 Type 2 diabetes mellitus with other specified complication: Secondary | ICD-10-CM

## 2023-03-17 DIAGNOSIS — R1013 Epigastric pain: Secondary | ICD-10-CM

## 2023-03-17 MED ORDER — OMEPRAZOLE 20 MG PO CPDR
20.0000 mg | DELAYED_RELEASE_CAPSULE | Freq: Every day | ORAL | 0 refills | Status: DC
  Filled 2023-03-17: qty 90, 90d supply, fill #0

## 2023-03-17 MED ORDER — ATORVASTATIN CALCIUM 20 MG PO TABS
20.0000 mg | ORAL_TABLET | Freq: Every day | ORAL | 0 refills | Status: DC
  Filled 2023-03-17: qty 90, 90d supply, fill #0

## 2023-03-17 MED ORDER — JANUMET 50-1000 MG PO TABS
1.0000 | ORAL_TABLET | Freq: Two times a day (BID) | ORAL | 0 refills | Status: DC
  Filled 2023-03-17: qty 180, 90d supply, fill #0

## 2023-03-29 DIAGNOSIS — J45909 Unspecified asthma, uncomplicated: Secondary | ICD-10-CM | POA: Diagnosis not present

## 2023-03-29 DIAGNOSIS — W010XXA Fall on same level from slipping, tripping and stumbling without subsequent striking against object, initial encounter: Secondary | ICD-10-CM | POA: Diagnosis not present

## 2023-03-29 DIAGNOSIS — S59902A Unspecified injury of left elbow, initial encounter: Secondary | ICD-10-CM | POA: Diagnosis not present

## 2023-03-29 DIAGNOSIS — S42402A Unspecified fracture of lower end of left humerus, initial encounter for closed fracture: Secondary | ICD-10-CM | POA: Diagnosis not present

## 2023-03-29 DIAGNOSIS — Z7984 Long term (current) use of oral hypoglycemic drugs: Secondary | ICD-10-CM | POA: Diagnosis not present

## 2023-03-29 DIAGNOSIS — Z794 Long term (current) use of insulin: Secondary | ICD-10-CM | POA: Diagnosis not present

## 2023-03-29 DIAGNOSIS — M79602 Pain in left arm: Secondary | ICD-10-CM | POA: Diagnosis not present

## 2023-03-29 DIAGNOSIS — S0990XA Unspecified injury of head, initial encounter: Secondary | ICD-10-CM | POA: Diagnosis not present

## 2023-03-29 DIAGNOSIS — S4992XA Unspecified injury of left shoulder and upper arm, initial encounter: Secondary | ICD-10-CM | POA: Diagnosis not present

## 2023-03-29 DIAGNOSIS — M25551 Pain in right hip: Secondary | ICD-10-CM | POA: Diagnosis not present

## 2023-03-29 DIAGNOSIS — S52022A Displaced fracture of olecranon process without intraarticular extension of left ulna, initial encounter for closed fracture: Secondary | ICD-10-CM | POA: Diagnosis not present

## 2023-03-29 DIAGNOSIS — Z79899 Other long term (current) drug therapy: Secondary | ICD-10-CM | POA: Diagnosis not present

## 2023-03-29 DIAGNOSIS — E119 Type 2 diabetes mellitus without complications: Secondary | ICD-10-CM | POA: Diagnosis not present

## 2023-03-29 DIAGNOSIS — R296 Repeated falls: Secondary | ICD-10-CM | POA: Diagnosis not present

## 2023-04-03 ENCOUNTER — Other Ambulatory Visit: Payer: Self-pay

## 2023-04-03 ENCOUNTER — Ambulatory Visit: Payer: Medicare Other | Attending: Nurse Practitioner | Admitting: Nurse Practitioner

## 2023-04-03 ENCOUNTER — Encounter: Payer: Self-pay | Admitting: Nurse Practitioner

## 2023-04-03 VITALS — BP 116/73 | HR 74 | Resp 16 | Ht 62.0 in | Wt 166.6 lb

## 2023-04-03 DIAGNOSIS — M542 Cervicalgia: Secondary | ICD-10-CM | POA: Insufficient documentation

## 2023-04-03 DIAGNOSIS — S42402D Unspecified fracture of lower end of left humerus, subsequent encounter for fracture with routine healing: Secondary | ICD-10-CM | POA: Diagnosis not present

## 2023-04-03 DIAGNOSIS — W19XXXA Unspecified fall, initial encounter: Secondary | ICD-10-CM | POA: Diagnosis not present

## 2023-04-03 DIAGNOSIS — M549 Dorsalgia, unspecified: Secondary | ICD-10-CM | POA: Diagnosis not present

## 2023-04-03 DIAGNOSIS — H9312 Tinnitus, left ear: Secondary | ICD-10-CM | POA: Insufficient documentation

## 2023-04-03 DIAGNOSIS — M79602 Pain in left arm: Secondary | ICD-10-CM | POA: Insufficient documentation

## 2023-04-03 DIAGNOSIS — Z09 Encounter for follow-up examination after completed treatment for conditions other than malignant neoplasm: Secondary | ICD-10-CM | POA: Diagnosis not present

## 2023-04-03 DIAGNOSIS — H9209 Otalgia, unspecified ear: Secondary | ICD-10-CM | POA: Diagnosis not present

## 2023-04-03 DIAGNOSIS — M791 Myalgia, unspecified site: Secondary | ICD-10-CM | POA: Diagnosis not present

## 2023-04-03 MED ORDER — TIZANIDINE HCL 4 MG PO TABS
4.0000 mg | ORAL_TABLET | Freq: Four times a day (QID) | ORAL | 0 refills | Status: DC | PRN
  Filled 2023-04-03: qty 32, 8d supply, fill #0

## 2023-04-03 MED ORDER — ACETAMINOPHEN-CODEINE 300-30 MG PO TABS
1.0000 | ORAL_TABLET | Freq: Two times a day (BID) | ORAL | 0 refills | Status: AC | PRN
  Filled 2023-04-03: qty 60, 30d supply, fill #0

## 2023-04-03 NOTE — Progress Notes (Signed)
Assessment & Plan:  Natalie Cherry was seen today for hospitalization follow-up and ear pain.  Diagnoses and all orders for this visit:  Hospital discharge follow-up -     acetaminophen-codeine (TYLENOL #3) 300-30 MG tablet; Take 1 tablet by mouth every 12 (twelve) hours as needed for moderate pain. -     tiZANidine (ZANAFLEX) 4 MG tablet; Take 1 tablet (4 mg total) by mouth every 6 (six) hours as needed for muscle spasms.  Closed fracture of left elbow with routine healing, subsequent encounter -     Ambulatory referral to Orthopedics -     acetaminophen-codeine (TYLENOL #3) 300-30 MG tablet; Take 1 tablet by mouth every 12 (twelve) hours as needed for moderate pain. -     tiZANidine (ZANAFLEX) 4 MG tablet; Take 1 tablet (4 mg total) by mouth every 6 (six) hours as needed for muscle spasms.  Tinnitus, left ear -     Ambulatory referral to ENT    Patient has been counseled on age-appropriate routine health concerns for screening and prevention. These are reviewed and up-to-date. Referrals have been placed accordingly. Immunizations are up-to-date or declined.    Subjective:   Chief Complaint  Patient presents with   Hospitalization Follow-up    ED   Ear Pain    Left ear Not able to hear out    HPI Natalie Cherry 62 y.o. female presents to office today for HFU  VRI was used to communicate directly with patient for the entire encounter including providing detailed patient instructions.    HFU Natalie Cherry was seen at [facility] emergency department on Mar 29, 2023 after a fall.  At that time her family stated she had fallen backwards at the airport in [facility] and was now presenting with complaints of pain in back of head, neck, back and left arm.  Imaging revealed left closed elbow fracture. She is currently in a left arm splint and needs to see orthopedics. Norco pain medication has been ineffective. She is also endorses generalized myalgias  today.   Notes new onset of Left ear buzzing and decreased hearing since 2 weeks ago. Denies any URI symptoms.  BP Readings from Last 3 Encounters:  04/03/23 116/73  10/03/22 124/74  04/06/22 106/87      She states she sees rheumtaology next week but is unsure if her PCP or rehumatologist is supposed to administer her enbrel.     Review of Systems  Constitutional:  Negative for fever, malaise/fatigue and weight loss.  HENT:  Positive for tinnitus. Negative for nosebleeds.   Eyes: Negative.  Negative for blurred vision, double vision and photophobia.  Respiratory: Negative.  Negative for cough and shortness of breath.   Cardiovascular: Negative.  Negative for chest pain, palpitations and leg swelling.  Gastrointestinal: Negative.  Negative for heartburn, nausea and vomiting.  Musculoskeletal:  Positive for back pain, falls, joint pain, myalgias and neck pain.  Neurological: Negative.  Negative for dizziness, focal weakness, seizures and headaches.  Psychiatric/Behavioral: Negative.  Negative for suicidal ideas.     Past Medical History:  Diagnosis Date   Arthritis    Diabetes mellitus    Hypertension     Past Surgical History:  Procedure Laterality Date   CHOLECYSTECTOMY     approx 6 yrs ago in [place]   COLONOSCOPY     approx 15 years ago in [place]    Family History  Problem Relation Age of Onset   Diabetes Mother    Asthma  Father    Colon cancer Neg Hx    Stomach cancer Neg Hx    Esophageal cancer Neg Hx    Pancreatic cancer Neg Hx    Liver disease Neg Hx     Social History Reviewed with no changes to be made today.   Outpatient Medications Prior to Visit  Medication Sig Dispense Refill   atorvastatin (LIPITOR) 20 MG tablet Take 1 tablet (20 mg total) by mouth daily. 90 tablet 0   denosumab (PROLIA) 60 MG/ML SOSY injection Inject 60 mg into the skin every 6 (six) months.     diclofenac Sodium (VOLTAREN) 1 % GEL Apply 2 g topically 4 (four) times daily. 100 g 2    etanercept (ENBREL SURECLICK) 50 MG/ML injection      hydroxychloroquine (PLAQUENIL) 200 MG tablet Take 200 mg by mouth daily.     omeprazole (PRILOSEC) 20 MG capsule Take 1 capsule (20 mg total) by mouth daily. 90 capsule 0   sitaGLIPtin-metformin (JANUMET) 50-1000 MG tablet Take 1 tablet by mouth 2 (two) times daily with a meal. 180 tablet 0   Accu-Chek Softclix Lancets lancets Use as instructed to check blood sugar three times daily. E11.9 (Patient not taking: Reported on 10/03/2022) 100 each 12   Blood Glucose Monitoring Suppl (ACCU-CHEK GUIDE) w/Device KIT Use as instructed to check blood sugar three times daily. (Patient not taking: Reported on 04/03/2023) 1 kit 0   cetirizine (ZYRTEC ALLERGY) 10 MG tablet Take 1 tablet (10 mg total) by mouth daily as needed for allergies. (Patient not taking: Reported on 04/03/2023) 90 tablet 3   Continuous Blood Gluc Receiver (FREESTYLE LIBRE 2 READER) DEVI Use as directed. (Patient not taking: Reported on 04/03/2023) 1 each 0   Continuous Blood Gluc Sensor (FREESTYLE LIBRE SENSOR SYSTEM) MISC Change sensor Q 2 wks (Patient not taking: Reported on 04/03/2023) 2 each 12   glucose blood (ACCU-CHEK GUIDE) test strip Use as instructed to check blood sugar three times daily. E11.9 (Patient not taking: Reported on 10/03/2022) 100 each 2   insulin detemir (LEVEMIR FLEXPEN) 100 UNIT/ML FlexPen Inject 8 Units into the skin at bedtime. (Patient not taking: Reported on 04/03/2023) 15 mL 11   Insulin Pen Needle (PEN NEEDLES) 31G X 8 MM MISC Use as directed (Patient not taking: Reported on 04/03/2023) 100 each 6   No facility-administered medications prior to visit.    No Known Allergies     Objective:    BP 116/73   Pulse 74   Resp 16   Wt 166 lb 9.6 oz (75.6 kg)   SpO2 97%   BMI 30.47 kg/m  Wt Readings from Last 3 Encounters:  04/03/23 166 lb 9.6 oz (75.6 kg)  10/03/22 161 lb (73 kg)  04/06/22 161 lb (73 kg)    Physical Exam Vitals and nursing note reviewed.   Constitutional:      Appearance: She is well-developed.  HENT:     Head: Normocephalic and atraumatic.  Cardiovascular:     Rate and Rhythm: Normal rate and regular rhythm.     Heart sounds: Normal heart sounds. No murmur heard.    No friction rub. No gallop.  Pulmonary:     Effort: Pulmonary effort is normal. No tachypnea or respiratory distress.     Breath sounds: Normal breath sounds. No decreased breath sounds, wheezing, rhonchi or rales.  Chest:     Chest wall: No tenderness.  Abdominal:     General: Bowel sounds are normal.  Palpations: Abdomen is soft.  Musculoskeletal:        General: Normal range of motion.     Cervical back: Normal range of motion.     Comments: Left arm sling  Skin:    General: Skin is warm and dry.  Neurological:     Mental Status: She is alert and oriented to person, place, and time.     Coordination: Coordination normal.  Psychiatric:        Behavior: Behavior normal. Behavior is cooperative.        Thought Content: Thought content normal.        Judgment: Judgment normal.          Patient has been counseled extensively about nutrition and exercise as well as the importance of adherence with medications and regular follow-up. The patient was given clear instructions to go to ER or return to medical center if symptoms don't improve, worsen or new problems develop. The patient verbalized understanding.   Follow-up: Return if symptoms worsen or fail to improve.   Claiborne Rigg, FNP-BC [facility] [place], [place] [phone]   04/03/2023, 4:20 PM

## 2023-04-05 ENCOUNTER — Other Ambulatory Visit: Payer: Self-pay

## 2023-04-05 ENCOUNTER — Encounter: Payer: Self-pay | Admitting: Orthopaedic Surgery

## 2023-04-05 ENCOUNTER — Other Ambulatory Visit (INDEPENDENT_AMBULATORY_CARE_PROVIDER_SITE_OTHER): Payer: Medicare Other

## 2023-04-05 ENCOUNTER — Ambulatory Visit (INDEPENDENT_AMBULATORY_CARE_PROVIDER_SITE_OTHER): Payer: Medicare Other | Admitting: Orthopaedic Surgery

## 2023-04-05 DIAGNOSIS — M25522 Pain in left elbow: Secondary | ICD-10-CM

## 2023-04-05 MED ORDER — BUPIVACAINE HCL 0.5 % IJ SOLN
1.0000 mL | INTRAMUSCULAR | Status: AC | PRN
Start: 2023-04-05 — End: 2023-04-05
  Administered 2023-04-05: 1 mL via INTRA_ARTICULAR

## 2023-04-05 MED ORDER — METHYLPREDNISOLONE ACETATE 40 MG/ML IJ SUSP
40.0000 mg | INTRAMUSCULAR | Status: AC | PRN
Start: 2023-04-05 — End: 2023-04-05
  Administered 2023-04-05: 40 mg via INTRA_ARTICULAR

## 2023-04-05 MED ORDER — TRAMADOL HCL 50 MG PO TABS
50.0000 mg | ORAL_TABLET | Freq: Every day | ORAL | 0 refills | Status: DC | PRN
  Filled 2023-04-05: qty 20, 10d supply, fill #0

## 2023-04-05 MED ORDER — LIDOCAINE HCL 1 % IJ SOLN
1.0000 mL | INTRAMUSCULAR | Status: AC | PRN
Start: 2023-04-05 — End: 2023-04-05
  Administered 2023-04-05: 1 mL

## 2023-04-05 NOTE — Progress Notes (Signed)
Office Visit Note   Patient: Natalie Cherry           Date of Birth: Feb 08, 1961           MRN: [id number] Visit Date: 04/05/2023              Requested by: Claiborne Rigg, NP [address] [place],  [place] [address] PCP: Marcine Matar, MD   Assessment & Plan: Visit Diagnoses:  1. Pain in left elbow     Plan: Impression 62 year old female with a left elbow pain.  She does have a fair amount of pre-existing OA which she is probably exacerbated.  I do not really see a true fracture on the x-rays.  Based on treatment options she opted for an intra-articular elbow injection.  She will follow-up if symptoms persist.  Tramadol prescribed.  Language barrier increased complexity of visit.  Total face to face encounter time was greater than 45 minutes and over half of this time was spent in counseling and/or coordination of care.   Follow-Up Instructions: No follow-ups on file.   Orders:  Orders Placed This Encounter  Procedures   Medium Joint Inj   XR Elbow Complete Left (3+View)   Meds ordered this encounter  Medications   traMADol (ULTRAM) 50 MG tablet    Sig: Take 1-2 tablets (50-100 mg total) by mouth daily as needed.    Dispense:  20 tablet    Refill:  0      Procedures: Medium Joint Inj: L elbow on 04/05/2023 10:35 AM Indications: pain Details: 25 G needle Medications: 1 mL lidocaine 1 %; 1 mL bupivacaine 0.5 %; 40 mg methylPREDNISolone acetate 40 MG/ML Outcome: tolerated well, no immediate complications Patient was prepped and draped in the usual sterile fashion.       Clinical Data: No additional findings.   Subjective: Chief Complaint  Patient presents with   Left Elbow - Injury    DOI 03/29/2023    HPI Patient is a 62 year old female here for left elbow injury status post mechanical fall a week ago.  Initially evaluated at [facility] and she was placed in a sling. Review of Systems  Constitutional: Negative.   HENT: Negative.     Eyes: Negative.   Respiratory: Negative.    Cardiovascular: Negative.   Endocrine: Negative.   Musculoskeletal: Negative.   Neurological: Negative.   Hematological: Negative.   Psychiatric/Behavioral: Negative.    All other systems reviewed and are negative.    Objective: Vital Signs: There were no vitals taken for this visit.  Physical Exam Vitals and nursing note reviewed.  Constitutional:      Appearance: She is well-developed.  HENT:     Head: Atraumatic.     Nose: Nose normal.  Eyes:     Extraocular Movements: Extraocular movements intact.  Cardiovascular:     Pulses: Normal pulses.  Pulmonary:     Effort: Pulmonary effort is normal.  Abdominal:     Palpations: Abdomen is soft.  Musculoskeletal:     Cervical back: Neck supple.  Skin:    General: Skin is warm.     Capillary Refill: Capillary refill takes less than 2 seconds.  Neurological:     Mental Status: She is alert. Mental status is at baseline.  Psychiatric:        Behavior: Behavior normal.        Thought Content: Thought content normal.        Judgment: Judgment normal.  Ortho Exam Examination left elbow shows pain deep in the elbow joint.  Gentle range of motion is well-tolerated.  Motor and sensory function intact.  Slight guarding to range of motion.  Ligamentous exam is normal. Specialty Comments:  No specialty comments available.  Imaging: No results found.   PMFS History: Patient Active Problem List   Diagnosis Date Noted   History of acute pancreatitis 10/03/2022   Hyperlipidemia associated with type 2 diabetes mellitus (HCC) 07/24/2020   Over weight 07/24/2020   Influenza vaccination declined 07/24/2020   Osteoporosis without current pathological fracture 12/07/2018   Type 2 diabetes mellitus with hyperglycemia, without long-term current use of insulin (HCC) 11/06/2015   Rheumatoid arthritis (HCC) 01/29/2014   Vitamin D deficiency 01/29/2014   DENTAL CARIES 03/02/2009    Osteoarthritis of spine 10/05/2007   Past Medical History:  Diagnosis Date   Arthritis    Diabetes mellitus    Hypertension     Family History  Problem Relation Age of Onset   Diabetes Mother    Asthma Father    Colon cancer Neg Hx    Stomach cancer Neg Hx    Esophageal cancer Neg Hx    Pancreatic cancer Neg Hx    Liver disease Neg Hx     Past Surgical History:  Procedure Laterality Date   CHOLECYSTECTOMY     approx 6 yrs ago in [place]   COLONOSCOPY     approx 15 years ago in [place]   Social History   Occupational History   Not on file  Tobacco Use   Smoking status: Never   Smokeless tobacco: Never  Vaping Use   Vaping Use: Never used  Substance and Sexual Activity   Alcohol use: No   Drug use: No   Sexual activity: Not Currently

## 2023-05-04 ENCOUNTER — Other Ambulatory Visit: Payer: Self-pay

## 2023-05-04 ENCOUNTER — Ambulatory Visit: Payer: Medicare Other | Attending: Internal Medicine | Admitting: Internal Medicine

## 2023-05-04 ENCOUNTER — Encounter: Payer: Self-pay | Admitting: Internal Medicine

## 2023-05-04 VITALS — BP 97/67 | HR 74 | Temp 98.0°F | Ht 62.0 in | Wt 165.0 lb

## 2023-05-04 DIAGNOSIS — M25522 Pain in left elbow: Secondary | ICD-10-CM | POA: Diagnosis not present

## 2023-05-04 DIAGNOSIS — Z1211 Encounter for screening for malignant neoplasm of colon: Secondary | ICD-10-CM

## 2023-05-04 DIAGNOSIS — M25521 Pain in right elbow: Secondary | ICD-10-CM | POA: Diagnosis not present

## 2023-05-04 DIAGNOSIS — M069 Rheumatoid arthritis, unspecified: Secondary | ICD-10-CM | POA: Insufficient documentation

## 2023-05-04 DIAGNOSIS — Z7984 Long term (current) use of oral hypoglycemic drugs: Secondary | ICD-10-CM | POA: Insufficient documentation

## 2023-05-04 DIAGNOSIS — Z23 Encounter for immunization: Secondary | ICD-10-CM | POA: Diagnosis not present

## 2023-05-04 DIAGNOSIS — Z0001 Encounter for general adult medical examination with abnormal findings: Secondary | ICD-10-CM | POA: Diagnosis not present

## 2023-05-04 DIAGNOSIS — E785 Hyperlipidemia, unspecified: Secondary | ICD-10-CM | POA: Insufficient documentation

## 2023-05-04 DIAGNOSIS — Z1231 Encounter for screening mammogram for malignant neoplasm of breast: Secondary | ICD-10-CM | POA: Diagnosis not present

## 2023-05-04 DIAGNOSIS — E1169 Type 2 diabetes mellitus with other specified complication: Secondary | ICD-10-CM | POA: Diagnosis not present

## 2023-05-04 DIAGNOSIS — Z7985 Long-term (current) use of injectable non-insulin antidiabetic drugs: Secondary | ICD-10-CM | POA: Diagnosis not present

## 2023-05-04 DIAGNOSIS — M0579 Rheumatoid arthritis with rheumatoid factor of multiple sites without organ or systems involvement: Secondary | ICD-10-CM | POA: Diagnosis not present

## 2023-05-04 DIAGNOSIS — Z79899 Other long term (current) drug therapy: Secondary | ICD-10-CM | POA: Insufficient documentation

## 2023-05-04 DIAGNOSIS — Z794 Long term (current) use of insulin: Secondary | ICD-10-CM | POA: Insufficient documentation

## 2023-05-04 LAB — GLUCOSE, POCT (MANUAL RESULT ENTRY): POC Glucose: 204 mg/dl — AB (ref 70–99)

## 2023-05-04 LAB — POCT GLYCOSYLATED HEMOGLOBIN (HGB A1C): HbA1c, POC (controlled diabetic range): 10.9 % — AB (ref 0.0–7.0)

## 2023-05-04 MED ORDER — LANTUS SOLOSTAR 100 UNIT/ML ~~LOC~~ SOPN
8.0000 [IU] | PEN_INJECTOR | Freq: Every day | SUBCUTANEOUS | 99 refills | Status: DC
Start: 2023-05-04 — End: 2023-09-05
  Filled 2023-05-04: qty 3, 28d supply, fill #0
  Filled 2023-06-09: qty 3, 28d supply, fill #1
  Filled 2023-07-27: qty 3, 28d supply, fill #2
  Filled 2023-08-28: qty 3, 28d supply, fill #3

## 2023-05-04 MED ORDER — ZOSTER VAC RECOMB ADJUVANTED 50 MCG/0.5ML IM SUSR
0.5000 mL | Freq: Once | INTRAMUSCULAR | 0 refills | Status: AC
Start: 2023-05-04 — End: 2023-05-04

## 2023-05-04 MED ORDER — JANUMET 50-1000 MG PO TABS
1.0000 | ORAL_TABLET | Freq: Two times a day (BID) | ORAL | 1 refills | Status: AC
Start: 2023-05-04 — End: ?
  Filled 2023-05-04: qty 180, 90d supply, fill #0
  Filled 2023-07-27: qty 180, 90d supply, fill #1

## 2023-05-04 MED ORDER — BD PEN NEEDLE NANO U/F 32G X 4 MM MISC
1 refills | Status: DC
Start: 2023-05-04 — End: 2024-09-02
  Filled 2023-05-04: qty 100, 25d supply, fill #0
  Filled 2023-06-09: qty 100, 90d supply, fill #0
  Filled 2023-07-27 – 2023-08-28 (×2): qty 100, 90d supply, fill #1

## 2023-05-04 MED ORDER — ATORVASTATIN CALCIUM 20 MG PO TABS
20.0000 mg | ORAL_TABLET | Freq: Every day | ORAL | 1 refills | Status: DC
Start: 2023-05-04 — End: 2023-09-05
  Filled 2023-05-04: qty 90, 90d supply, fill #0
  Filled 2023-07-27: qty 90, 90d supply, fill #1

## 2023-05-04 NOTE — Progress Notes (Signed)
Patient ID: Natalie Cherry, female    DOB: [birth year]  MRN: [id number]  CC: Annual Exam (Physical. Med refills - enbrel, methotrexate, prolia/Pain in arms X2 mo/Yes to shingles vax.  Yes to colonoscopy referral. )   Subjective: Natalie Cherry is a 62 y.o. female who presents for chronic ds management Her concerns today include:  Pt with hx of DM, HL, RA, osteoporosis on Prolia, acute pancreatitis   [employer] Language interpreter used during this encounter. #Chrystian, [id number]  DM:   Results for orders placed or performed in visit on 05/04/23  POCT glucose (manual entry)  Result Value Ref Range   POC Glucose 204 (A) 70 - 99 mg/dl  POCT glycosylated hemoglobin (Hb A1C)  Result Value Ref Range   Hemoglobin A1C     HbA1c POC (<> result, manual entry)     HbA1c, POC (prediabetic range)     HbA1c, POC (controlled diabetic range) 10.9 (A) 0.0 - 7.0 %  Suppose to be on Janumet 50/1000 BID and Leveimir 8 units QHS.  Reports being out of the insulin for several mths -checks BS Q 3 days.  Gives range 130-170. CGM prescribed on last visit; not covered by insurance She feels she is doing okay with her eating habits. Not as much movement as she would like due to pain in the joints from rheumatoid arthritis and a fall that she had several months ago causing increased pain in her elbows.  Reports that she was seen by her rheumatologist for this. -due for DM eye exam, agreeable to referral. Reports compliance with taking atorvastatin for cholesterol.   RA: has appt with rheumatologist, Dr. Kathi Ludwig tomorrow.  Needing refill on Embrel and MTX.  However I do not see a recent prescription for methotrexate.  Looks like she is on Embrel and Plaquenil. Reports increased pain in her joints  HM:  due for colon CA screen, shingles vaccine, MMG  Patient Active Problem List   Diagnosis Date Noted   History of acute pancreatitis 10/03/2022   Hyperlipidemia associated with type 2 diabetes mellitus (HCC) 07/24/2020    Over weight 07/24/2020   Influenza vaccination declined 07/24/2020   Osteoporosis without current pathological fracture 12/07/2018   Type 2 diabetes mellitus with hyperglycemia, without long-term current use of insulin (HCC) 11/06/2015   Rheumatoid arthritis (HCC) 01/29/2014   Vitamin D deficiency 01/29/2014   DENTAL CARIES 03/02/2009   Osteoarthritis of spine 10/05/2007     Current Outpatient Medications on File Prior to Visit  Medication Sig Dispense Refill   Accu-Chek Softclix Lancets lancets Use as instructed to check blood sugar three times daily. E11.9 100 each 12   acetaminophen-codeine (TYLENOL #3) 300-30 MG tablet Take 1 tablet by mouth every 12 (twelve) hours as needed for moderate pain. 60 tablet 0   atorvastatin (LIPITOR) 20 MG tablet Take 1 tablet (20 mg total) by mouth daily. 90 tablet 0   Blood Glucose Monitoring Suppl (ACCU-CHEK GUIDE) w/Device KIT Use as instructed to check blood sugar three times daily. 1 kit 0   cetirizine (ZYRTEC ALLERGY) 10 MG tablet Take 1 tablet (10 mg total) by mouth daily as needed for allergies. 90 tablet 3   Continuous Blood Gluc Receiver (FREESTYLE LIBRE 2 READER) DEVI Use as directed. 1 each 0   Continuous Blood Gluc Sensor (FREESTYLE LIBRE SENSOR SYSTEM) MISC Change sensor Q 2 wks 2 each 12   denosumab (PROLIA) 60 MG/ML SOSY injection Inject 60 mg into the skin every 6 (six) months.  diclofenac Sodium (VOLTAREN) 1 % GEL Apply 2 g topically 4 (four) times daily. 100 g 2   etanercept (ENBREL SURECLICK) 50 MG/ML injection      glucose blood (ACCU-CHEK GUIDE) test strip Use as instructed to check blood sugar three times daily. E11.9 100 each 2   hydroxychloroquine (PLAQUENIL) 200 MG tablet Take 200 mg by mouth daily.     insulin detemir (LEVEMIR FLEXPEN) 100 UNIT/ML FlexPen Inject 8 Units into the skin at bedtime. 15 mL 11   Insulin Pen Needle (PEN NEEDLES) 31G X 8 MM MISC Use as directed 100 each 6   omeprazole (PRILOSEC) 20 MG capsule Take 1  capsule (20 mg total) by mouth daily. 90 capsule 0   sitaGLIPtin-metformin (JANUMET) 50-1000 MG tablet Take 1 tablet by mouth 2 (two) times daily with a meal. 180 tablet 0   tiZANidine (ZANAFLEX) 4 MG tablet Take 1 tablet (4 mg total) by mouth every 6 (six) hours as needed for muscle spasms. 32 tablet 0   traMADol (ULTRAM) 50 MG tablet Take 1-2 tablets (50-100 mg total) by mouth daily as needed. 20 tablet 0   No current facility-administered medications on file prior to visit.    No Known Allergies  Social History   Socioeconomic History   Marital status: Married    Spouse name: Not on file   Number of children: Not on file   Years of education: Not on file   Highest education level: Not on file  Occupational History   Not on file  Tobacco Use   Smoking status: Never   Smokeless tobacco: Never  Vaping Use   Vaping Use: Never used  Substance and Sexual Activity   Alcohol use: No   Drug use: No   Sexual activity: Not Currently  Other Topics Concern   Not on file  Social History Narrative   Not on file   Social Determinants of Health   Financial Resource Strain: Not on file  Food Insecurity: Not on file  Transportation Needs: Not on file  Physical Activity: Not on file  Stress: Not on file  Social Connections: Not on file  Intimate Partner Violence: Not on file    Family History  Problem Relation Age of Onset   Diabetes Mother    Asthma Father    Colon cancer Neg Hx    Stomach cancer Neg Hx    Esophageal cancer Neg Hx    Pancreatic cancer Neg Hx    Liver disease Neg Hx     Past Surgical History:  Procedure Laterality Date   CHOLECYSTECTOMY     approx 6 yrs ago in [place]   COLONOSCOPY     approx 15 years ago in [place]    ROS: Review of Systems Negative except as stated above  PHYSICAL EXAM: BP 97/67 (BP Location: Left Arm, Patient Position: Sitting, Cuff Size: Normal)   Pulse 74   Temp 98 F (36.7 C) (Oral)   Ht 5\' 2"  (1.575 m)   Wt 165 lb (74.8 kg)    SpO2 98%   BMI 30.18 kg/m   Physical Exam  General appearance - alert, well appearing, and in no distress Mental status - normal mood, behavior, speech, dress, motor activity, and thought processes Eyes - pupils equal and reactive, extraocular eye movements intact Ears -small amount of wax in both ears.  Tympanic membranes within normal limits. Nose - normal and patent, no erythema, discharge or polyps Mouth - mucous membranes moist, pharynx normal without lesions Neck -  supple, no significant adenopathy Lymphatics -no cervical or axillary lymphadenopathy. Chest - clear to auscultation, no wheezes, rales or rhonchi, symmetric air entry Heart - normal rate, regular rhythm, normal S1, S2, no murmurs, rubs, clicks or gallops Extremities - peripheral pulses normal, no pedal edema, no clubbing or cyanosis MSK: She has deformities of the joints in both hands.  Gait is slow.     Latest Ref Rng & Units 04/06/2022   11:02 AM 01/12/2022    9:34 AM 05/03/2021   10:09 AM  CMP  Glucose 70 - 99 mg/dL 161  096  045   BUN 8 - 27 mg/dL 13  7  12    Creatinine 0.57 - 1.00 mg/dL 4.09  8.11  9.14   Sodium 134 - 144 mmol/L 140  142  140   Potassium 3.5 - 5.2 mmol/L 4.2  4.0  4.3   Chloride 96 - 106 mmol/L 105  106  106   CO2 20 - 29 mmol/L 23  24  20    Calcium 8.7 - 10.3 mg/dL 9.3  9.2  9.4   Total Protein 6.0 - 8.5 g/dL 7.9  7.7  7.6   Total Bilirubin 0.0 - 1.2 mg/dL 0.2  0.5  0.2   Alkaline Phos 44 - 121 IU/L 136  132  105   AST 0 - 40 IU/L 44  30  24   ALT 0 - 32 IU/L 54  37  33    Lipid Panel     Component Value Date/Time   CHOL 154 04/06/2022 1102   TRIG 149 04/06/2022 1102   HDL 38 (L) 04/06/2022 1102   CHOLHDL 4.1 04/06/2022 1102   CHOLHDL 4.6 12/13/2016 1149   VLDL 41 (H) 12/13/2016 1149   LDLCALC 90 04/06/2022 1102    CBC    Component Value Date/Time   WBC 5.7 10/03/2022 0950   WBC 8.1 10/05/2020 1615   RBC 4.29 10/03/2022 0950   RBC 4.05 10/05/2020 1615   HGB 12.3 10/03/2022  0950   HCT 37.5 10/03/2022 0950   PLT 323 10/03/2022 0950   MCV 87 10/03/2022 0950   MCH 28.7 10/03/2022 0950   MCH 28.8 12/13/2016 1149   MCHC 32.8 10/03/2022 0950   MCHC 33.1 10/05/2020 1615   RDW 13.9 10/03/2022 0950   LYMPHSABS 2.3 10/05/2020 1615   MONOABS 0.4 10/05/2020 1615   EOSABS 0.2 10/05/2020 1615   BASOSABS 0.1 10/05/2020 1615    ASSESSMENT AND PLAN: 1. Type 2 diabetes mellitus with hyperlipidemia (HCC) Not at goal. Continue Janumet. Recommend restarting basal insulin at bedtime 8 units.  Changed to glargine insulin as Levemir no longer on the market.  Encouraged her to check blood sugars at least twice a day before breakfast and dinner.  Bring readings with her in several weeks to see our clinical pharmacist. - POCT glucose (manual entry) - POCT glycosylated hemoglobin (Hb A1C) - sitaGLIPtin-metformin (JANUMET) 50-1000 MG tablet; Take 1 tablet by mouth 2 (two) times daily with a meal.  Dispense: 180 tablet; Refill: 1 - atorvastatin (LIPITOR) 20 MG tablet; Take 1 tablet (20 mg total) by mouth daily.  Dispense: 90 tablet; Refill: 1 - Microalbumin / creatinine urine ratio - Ambulatory referral to Ophthalmology - Lipid panel - Comprehensive metabolic panel - insulin glargine (LANTUS SOLOSTAR) 100 UNIT/ML Solostar Pen; Inject 8 Units into the skin daily.  Dispense: 15 mL; Refill: PRN - Insulin Pen Needle (BD PEN NEEDLE NANO U/F) 32G X 4 MM MISC; Use as directed with  insulin pen  Dispense: 100 each; Refill: 1  2. Rheumatoid arthritis involving multiple sites with positive rheumatoid factor (HCC) Keep appointment tomorrow with her rheumatologist.  She can request refills on her medicines at that appointment.  3. Need for shingles vaccine Agreeable to receiving shingles vaccine.  Printed prescription given to her to get the first Shingrix shot at her pharmacy. - Zoster Vaccine Adjuvanted [facility]) injection; Inject 0.5 mLs into the muscle once for 1 dose.  Dispense: 0.5 mL;  Refill: 0  4. Encounter for screening mammogram for malignant neoplasm of breast - MM Digital Screening; Future  5. Screening for colon cancer Patient prefers colonoscopy.  Referred to gastroenterology.    Patient was given the opportunity to ask questions.  Patient verbalized understanding of the plan and was able to repeat key elements of the plan.   This documentation was completed using Paediatric nurse.  Any transcriptional errors are unintentional.  Orders Placed This Encounter  Procedures   POCT glucose (manual entry)   POCT glycosylated hemoglobin (Hb A1C)     Requested Prescriptions   Pending Prescriptions Disp Refills   sitaGLIPtin-metformin (JANUMET) 50-1000 MG tablet 180 tablet 0    Sig: Take 1 tablet by mouth 2 (two) times daily with a meal.   atorvastatin (LIPITOR) 20 MG tablet 90 tablet 0    Sig: Take 1 tablet (20 mg total) by mouth daily.    No follow-ups on file.  Jonah Blue, MD, FACP

## 2023-05-05 ENCOUNTER — Other Ambulatory Visit: Payer: Self-pay

## 2023-05-05 DIAGNOSIS — Z79899 Other long term (current) drug therapy: Secondary | ICD-10-CM | POA: Diagnosis not present

## 2023-05-05 DIAGNOSIS — M1990 Unspecified osteoarthritis, unspecified site: Secondary | ICD-10-CM | POA: Diagnosis not present

## 2023-05-05 DIAGNOSIS — M810 Age-related osteoporosis without current pathological fracture: Secondary | ICD-10-CM | POA: Diagnosis not present

## 2023-05-05 DIAGNOSIS — E785 Hyperlipidemia, unspecified: Secondary | ICD-10-CM | POA: Diagnosis not present

## 2023-05-05 DIAGNOSIS — I10 Essential (primary) hypertension: Secondary | ICD-10-CM | POA: Diagnosis not present

## 2023-05-05 DIAGNOSIS — M069 Rheumatoid arthritis, unspecified: Secondary | ICD-10-CM | POA: Diagnosis not present

## 2023-05-05 DIAGNOSIS — E119 Type 2 diabetes mellitus without complications: Secondary | ICD-10-CM | POA: Diagnosis not present

## 2023-05-05 LAB — COMPREHENSIVE METABOLIC PANEL
ALT: 72 IU/L — ABNORMAL HIGH (ref 0–32)
AST: 58 IU/L — ABNORMAL HIGH (ref 0–40)
Albumin/Globulin Ratio: 1.2 (ref 1.2–2.2)
Albumin: 4.4 g/dL (ref 3.9–4.9)
Alkaline Phosphatase: 139 IU/L — ABNORMAL HIGH (ref 44–121)
BUN/Creatinine Ratio: 16 (ref 12–28)
BUN: 10 mg/dL (ref 8–27)
Bilirubin Total: 0.2 mg/dL (ref 0.0–1.2)
CO2: 21 mmol/L (ref 20–29)
Calcium: 9.8 mg/dL (ref 8.7–10.3)
Chloride: 102 mmol/L (ref 96–106)
Creatinine, Ser: 0.63 mg/dL (ref 0.57–1.00)
Globulin, Total: 3.6 g/dL (ref 1.5–4.5)
Glucose: 179 mg/dL — ABNORMAL HIGH (ref 70–99)
Potassium: 3.9 mmol/L (ref 3.5–5.2)
Sodium: 138 mmol/L (ref 134–144)
Total Protein: 8 g/dL (ref 6.0–8.5)
eGFR: 100 mL/min/{1.73_m2} (ref >59)

## 2023-05-05 LAB — LIPID PANEL
Chol/HDL Ratio: 4 ratio (ref 0.0–4.4)
Cholesterol, Total: 187 mg/dL (ref 100–199)
HDL: 47 mg/dL (ref >39)
LDL Chol Calc (NIH): 110 mg/dL — ABNORMAL HIGH (ref 0–99)
Triglycerides: 172 mg/dL — ABNORMAL HIGH (ref 0–149)
VLDL Cholesterol Cal: 30 mg/dL (ref 5–40)

## 2023-05-07 LAB — MICROALBUMIN / CREATININE URINE RATIO
Creatinine, Urine: 33 mg/dL
Microalb/Creat Ratio: 12 mg/g creat (ref 0–29)
Microalbumin, Urine: 4.1 ug/mL

## 2023-05-08 ENCOUNTER — Other Ambulatory Visit: Payer: Self-pay

## 2023-05-12 ENCOUNTER — Other Ambulatory Visit: Payer: Self-pay

## 2023-06-08 ENCOUNTER — Ambulatory Visit: Payer: Self-pay | Admitting: Pharmacist

## 2023-06-09 ENCOUNTER — Other Ambulatory Visit: Payer: Self-pay

## 2023-06-12 ENCOUNTER — Other Ambulatory Visit: Payer: Self-pay

## 2023-06-14 ENCOUNTER — Other Ambulatory Visit: Payer: Self-pay

## 2023-06-14 MED ORDER — ZOSTER VAC RECOMB ADJUVANTED 50 MCG/0.5ML IM SUSR
0.5000 mL | Freq: Once | INTRAMUSCULAR | 0 refills | Status: AC
  Filled 2023-06-14: qty 0.5, 1d supply, fill #0

## 2023-07-27 ENCOUNTER — Other Ambulatory Visit: Payer: Self-pay

## 2023-07-27 ENCOUNTER — Other Ambulatory Visit: Payer: Self-pay | Admitting: Internal Medicine

## 2023-07-28 ENCOUNTER — Other Ambulatory Visit: Payer: Self-pay

## 2023-08-08 DIAGNOSIS — Z79899 Other long term (current) drug therapy: Secondary | ICD-10-CM | POA: Diagnosis not present

## 2023-08-08 DIAGNOSIS — E785 Hyperlipidemia, unspecified: Secondary | ICD-10-CM | POA: Diagnosis not present

## 2023-08-08 DIAGNOSIS — E119 Type 2 diabetes mellitus without complications: Secondary | ICD-10-CM | POA: Diagnosis not present

## 2023-08-08 DIAGNOSIS — M25521 Pain in right elbow: Secondary | ICD-10-CM | POA: Diagnosis not present

## 2023-08-08 DIAGNOSIS — M810 Age-related osteoporosis without current pathological fracture: Secondary | ICD-10-CM | POA: Diagnosis not present

## 2023-08-08 DIAGNOSIS — I10 Essential (primary) hypertension: Secondary | ICD-10-CM | POA: Diagnosis not present

## 2023-08-08 DIAGNOSIS — M1990 Unspecified osteoarthritis, unspecified site: Secondary | ICD-10-CM | POA: Diagnosis not present

## 2023-08-08 DIAGNOSIS — M069 Rheumatoid arthritis, unspecified: Secondary | ICD-10-CM | POA: Diagnosis not present

## 2023-08-28 ENCOUNTER — Other Ambulatory Visit: Payer: Self-pay | Admitting: Internal Medicine

## 2023-08-28 ENCOUNTER — Other Ambulatory Visit: Payer: Self-pay

## 2023-08-28 DIAGNOSIS — E1169 Type 2 diabetes mellitus with other specified complication: Secondary | ICD-10-CM

## 2023-08-29 ENCOUNTER — Other Ambulatory Visit: Payer: Self-pay | Admitting: Internal Medicine

## 2023-08-29 ENCOUNTER — Other Ambulatory Visit: Payer: Self-pay

## 2023-08-29 DIAGNOSIS — E1169 Type 2 diabetes mellitus with other specified complication: Secondary | ICD-10-CM

## 2023-08-29 MED ORDER — JANUMET 50-1000 MG PO TABS
1.0000 | ORAL_TABLET | Freq: Two times a day (BID) | ORAL | 1 refills | Status: DC
Start: 2023-08-29 — End: 2024-01-09
  Filled 2023-08-29 – 2023-12-04 (×3): qty 180, 90d supply, fill #0

## 2023-08-29 NOTE — Telephone Encounter (Signed)
Requested Prescriptions  Pending Prescriptions Disp Refills   sitaGLIPtin-metformin (JANUMET) 50-1000 MG tablet 180 tablet 1    Sig: Take 1 tablet by mouth 2 (two) times daily with a meal.     Endocrinology:  Diabetes - Biguanide + DPP-4 Inhibitor Combos Failed - 08/28/2023  3:24 PM      Failed - HBA1C is between 0 and 7.9 and within 180 days    HbA1c, POC (controlled diabetic range)  Date Value Ref Range Status  05/04/2023 10.9 (A) 0.0 - 7.0 % Final         Failed - B12 Level in normal range and within 720 days    No results found for: "VITAMINB12"       Failed - CBC within normal limits and completed in the last 12 months    WBC  Date Value Ref Range Status  10/03/2022 5.7 3.4 - 10.8 x10E3/uL Final  10/05/2020 8.1 4.0 - 10.5 K/uL Final   RBC  Date Value Ref Range Status  10/03/2022 4.29 3.77 - 5.28 x10E6/uL Final  10/05/2020 4.05 3.87 - 5.11 Mil/uL Final   Hemoglobin  Date Value Ref Range Status  10/03/2022 12.3 11.1 - 15.9 g/dL Final   Hematocrit  Date Value Ref Range Status  10/03/2022 37.5 34.0 - 46.6 % Final   MCHC  Date Value Ref Range Status  10/03/2022 32.8 31.5 - 35.7 g/dL Final  16/08/9603 54.0 30.0 - 36.0 g/dL Final   [facility]  Date Value Ref Range Status  10/03/2022 28.7 26.6 - 33.0 pg Final  12/13/2016 28.8 27.0 - 33.0 pg Final   MCV  Date Value Ref Range Status  10/03/2022 87 79 - 97 fL Final   No results found for: "PLTCOUNTKUC", "LABPLAT", "POCPLA" RDW  Date Value Ref Range Status  10/03/2022 13.9 11.7 - 15.4 % Final         Passed - Cr in normal range and within 360 days    Creat  Date Value Ref Range Status  12/13/2016 0.80 0.50 - 1.05 mg/dL Final    Comment:      For patients > or = 63 years of age: The upper reference limit for Creatinine is approximately 13% higher for people identified as African-American.      Creatinine, Ser  Date Value Ref Range Status  05/04/2023 0.63 0.57 - 1.00 mg/dL Final   Creatinine, Urine  Date Value  Ref Range Status  12/13/2016 268 20 - 320 mg/dL Final         Passed - eGFR in normal range and within 360 days    GFR, Est African American  Date Value Ref Range Status  12/13/2016 >89 >=60 mL/min Final   GFR calc Af Amer  Date Value Ref Range Status  07/24/2020 111 >59 mL/min/1.73 Final    Comment:    **[facility] currently reports eGFR in compliance with the current**   recommendations of the [employer]. [facility] will   update reporting as new guidelines are published from the NKF-ASN   Task force.    GFR, Est Non African American  Date Value Ref Range Status  12/13/2016 83 >=60 mL/min Final   GFR calc non Af Amer  Date Value Ref Range Status  07/24/2020 96 >59 mL/min/1.73 Final   GFR  Date Value Ref Range Status  10/05/2020 95.38 >60.00 mL/min Final    Comment:    Calculated using the CKD-EPI Creatinine Equation (2021)   eGFR  Date Value Ref Range Status  05/04/2023 100 >  59 mL/min/1.73 Final         Passed - Valid encounter within last 6 months    Recent Outpatient Visits           3 months ago Type 2 diabetes mellitus with hyperlipidemia [facility])   [place] [facility] Marcine Matar, MD   4 months ago Hospital discharge follow-up   [facility] Claiborne Rigg, NP   11 months ago Type 2 diabetes mellitus with hyperlipidemia [facility])   [place] Jonah Blue B, MD   1 year ago Uncontrolled type 2 diabetes mellitus with hyperglycemia [facility])   [place] [place], [place], [place]   1 year ago Type 2 diabetes mellitus with hyperlipidemia [facility])   [place] [facility] Marcine Matar, MD       Future Appointments             In 1 week Marcine Matar, MD [facility]

## 2023-09-01 ENCOUNTER — Other Ambulatory Visit: Payer: Self-pay | Admitting: Internal Medicine

## 2023-09-01 DIAGNOSIS — Z1211 Encounter for screening for malignant neoplasm of colon: Secondary | ICD-10-CM

## 2023-09-01 DIAGNOSIS — Z1212 Encounter for screening for malignant neoplasm of rectum: Secondary | ICD-10-CM

## 2023-09-05 ENCOUNTER — Ambulatory Visit: Payer: Self-pay | Attending: Internal Medicine | Admitting: Internal Medicine

## 2023-09-05 ENCOUNTER — Encounter: Payer: Self-pay | Admitting: Internal Medicine

## 2023-09-05 ENCOUNTER — Other Ambulatory Visit: Payer: Self-pay

## 2023-09-05 VITALS — BP 131/76 | HR 62 | Temp 98.1°F | Ht 62.0 in | Wt 164.0 lb

## 2023-09-05 DIAGNOSIS — Z7984 Long term (current) use of oral hypoglycemic drugs: Secondary | ICD-10-CM | POA: Insufficient documentation

## 2023-09-05 DIAGNOSIS — Z2839 Other underimmunization status: Secondary | ICD-10-CM | POA: Insufficient documentation

## 2023-09-05 DIAGNOSIS — E785 Hyperlipidemia, unspecified: Secondary | ICD-10-CM | POA: Insufficient documentation

## 2023-09-05 DIAGNOSIS — R7989 Other specified abnormal findings of blood chemistry: Secondary | ICD-10-CM

## 2023-09-05 DIAGNOSIS — Z2821 Immunization not carried out because of patient refusal: Secondary | ICD-10-CM | POA: Diagnosis not present

## 2023-09-05 DIAGNOSIS — E1169 Type 2 diabetes mellitus with other specified complication: Secondary | ICD-10-CM | POA: Diagnosis not present

## 2023-09-05 DIAGNOSIS — E119 Type 2 diabetes mellitus without complications: Secondary | ICD-10-CM

## 2023-09-05 DIAGNOSIS — R030 Elevated blood-pressure reading, without diagnosis of hypertension: Secondary | ICD-10-CM

## 2023-09-05 DIAGNOSIS — M0579 Rheumatoid arthritis with rheumatoid factor of multiple sites without organ or systems involvement: Secondary | ICD-10-CM

## 2023-09-05 DIAGNOSIS — M0589 Other rheumatoid arthritis with rheumatoid factor of multiple sites: Secondary | ICD-10-CM | POA: Diagnosis not present

## 2023-09-05 DIAGNOSIS — Z794 Long term (current) use of insulin: Secondary | ICD-10-CM

## 2023-09-05 DIAGNOSIS — Z2882 Immunization not carried out because of caregiver refusal: Secondary | ICD-10-CM | POA: Insufficient documentation

## 2023-09-05 LAB — POCT GLYCOSYLATED HEMOGLOBIN (HGB A1C): HbA1c, POC (controlled diabetic range): 7.8 % — AB (ref 0.0–7.0)

## 2023-09-05 LAB — GLUCOSE, POCT (MANUAL RESULT ENTRY): POC Glucose: 160 mg/dL — AB (ref 70–99)

## 2023-09-05 MED ORDER — LANTUS SOLOSTAR 100 UNIT/ML ~~LOC~~ SOPN
9.0000 [IU] | PEN_INJECTOR | Freq: Every day | SUBCUTANEOUS | 99 refills | Status: DC
Start: 2023-09-05 — End: 2024-01-09
  Filled 2023-09-05: qty 15, 166d supply, fill #0
  Filled 2023-10-12: qty 9, 90d supply, fill #0

## 2023-09-05 MED ORDER — FREESTYLE LIBRE 3 SENSOR MISC
11 refills | Status: DC
Start: 2023-09-05 — End: 2024-05-27
  Filled 2023-09-05: qty 2, 28d supply, fill #0

## 2023-09-05 MED ORDER — ATORVASTATIN CALCIUM 20 MG PO TABS
20.0000 mg | ORAL_TABLET | Freq: Every day | ORAL | 1 refills | Status: DC
  Filled 2023-09-05: qty 90, 90d supply, fill #0

## 2023-09-05 NOTE — Progress Notes (Signed)
Patient ID: Natalie Cherry, female    DOB: [birth year]  MRN: [id number]  CC: Diabetes (DM f/u. /No questions / concerns/No to flu vax.no to shingles vax.)   Subjective: Natalie Cherry is a 62 y.o. female who presents for chronic ds management. Her concerns today include:  Pt with hx of DM, HL, RA, osteoporosis on Prolia, acute pancreatitis   [employer] Language interpreter used during this encounter. # Minerva Areola [phone]   DM: Results for orders placed or performed in visit on 09/05/23  POCT glycosylated hemoglobin (Hb A1C)  Result Value Ref Range   Hemoglobin A1C     HbA1c POC (<> result, manual entry)     HbA1c, POC (prediabetic range)     HbA1c, POC (controlled diabetic range) 7.8 (A) 0.0 - 7.0 %  POCT glucose (manual entry)  Result Value Ref Range   POC Glucose 160 (A) 70 - 99 mg/dl  Z6W has improved from 10.9 4 mths ago Should be on Glargine insulin 8 units daily and Janumet 50/1000 mg BID; reports compliance Checks BS Q 3 days at bedtime; gives range 117-143 Has decrease white starches and sugars, less red meat and cooks her meals.  Walking now 30 mins a day Out Lipitor 20 mg x 2 mths; Last LDL was 110.  Has mild elev in AST/ALT and Alk phos that was likely due to Lipitor  RA;  followed by Dr. Kathi Ludwig.  Last seen 07/2023. On Embrel and Plaquenil.  Request RF on Folic Acid.  Was on MXT in past but taken off.  Reports she is doing well on Enbrel and Plaquenil.  Hx of Osteoporosis: on Prolia through her rheumatologist.  Last shot was July.    Patient Active Problem List   Diagnosis Date Noted   History of acute pancreatitis 10/03/2022   Hyperlipidemia associated with type 2 diabetes mellitus (HCC) 07/24/2020   Over weight 07/24/2020   Influenza vaccination declined 07/24/2020   Osteoporosis without current pathological fracture 12/07/2018   Type 2 diabetes mellitus with hyperglycemia, without long-term current use of insulin (HCC) 11/06/2015   Rheumatoid arthritis (HCC) 01/29/2014   Vitamin  D deficiency 01/29/2014   Dental caries 03/02/2009   Osteoarthritis of spine 10/05/2007     Current Outpatient Medications on File Prior to Visit  Medication Sig Dispense Refill   Accu-Chek Softclix Lancets lancets Use as instructed to check blood sugar three times daily. E11.9 100 each 12   acetaminophen-codeine (TYLENOL #3) 300-30 MG tablet Take 1 tablet by mouth every 12 (twelve) hours as needed for moderate pain. 60 tablet 0   Blood Glucose Monitoring Suppl (ACCU-CHEK GUIDE) w/Device KIT Use as instructed to check blood sugar three times daily. 1 kit 0   cetirizine (ZYRTEC ALLERGY) 10 MG tablet Take 1 tablet (10 mg total) by mouth daily as needed for allergies. 90 tablet 3   denosumab (PROLIA) 60 MG/ML SOSY injection Inject 60 mg into the skin every 6 (six) months.     etanercept (ENBREL SURECLICK) 50 MG/ML injection      glucose blood (ACCU-CHEK GUIDE) test strip Use as instructed to check blood sugar three times daily. E11.9 100 each 2   Insulin Pen Needle (BD PEN NEEDLE NANO U/F) 32G X 4 MM MISC Use as directed with insulin pen 100 each 1   sitaGLIPtin-metformin (JANUMET) 50-1000 MG tablet Take 1 tablet by mouth 2 (two) times daily with a meal. 180 tablet 1   diclofenac Sodium (VOLTAREN) 1 % GEL Apply 2 g topically 4 (  four) times daily. (Patient not taking: Reported on 09/05/2023) 100 g 2   hydroxychloroquine (PLAQUENIL) 200 MG tablet Take 200 mg by mouth daily. (Patient not taking: Reported on 09/05/2023)     No current facility-administered medications on file prior to visit.    No Known Allergies  Social History   Socioeconomic History   Marital status: Married    Spouse name: Not on file   Number of children: Not on file   Years of education: Not on file   Highest education level: Not on file  Occupational History   Not on file  Tobacco Use   Smoking status: Never   Smokeless tobacco: Never  Vaping Use   Vaping status: Never Used  Substance and Sexual Activity    Alcohol use: No   Drug use: No   Sexual activity: Not Currently  Other Topics Concern   Not on file  Social History Narrative   Not on file   Social Determinants of Health   Financial Resource Strain: Medium Risk (09/05/2023)   Overall Financial Resource Strain (CARDIA)    Difficulty of Paying Living Expenses: Somewhat hard  Food Insecurity: Food Insecurity Present (09/05/2023)   Hunger Vital Sign    Worried About Running Out of Food in the Last Year: Sometimes true    Ran Out of Food in the Last Year: Sometimes true  Transportation Needs: No Transportation Needs (09/05/2023)   PRAPARE - Administrator, Civil Service (Medical): No    Lack of Transportation (Non-Medical): No  Physical Activity: Inactive (09/05/2023)   Exercise Vital Sign    Days of Exercise per Week: 0 days    Minutes of Exercise per Session: 0 min  Stress: Stress Concern Present (09/05/2023)   [employer] of Occupational Health - Occupational Stress Questionnaire    Feeling of Stress : Rather much  Social Connections: Socially Integrated (09/05/2023)   Social Connection and Isolation Panel [NHANES]    Frequency of Communication with Friends and Family: More than three times a week    Frequency of Social Gatherings with Friends and Family: Three times a week    Attends Religious Services: More than 4 times per year    Active Member of Clubs or Organizations: Yes    Attends Banker Meetings: More than 4 times per year    Marital Status: Married  Catering manager Violence: Not At Risk (09/05/2023)   Humiliation, Afraid, Rape, and Kick questionnaire    Fear of Current or Ex-Partner: No    Emotionally Abused: No    Physically Abused: No    Sexually Abused: No    Family History  Problem Relation Age of Onset   Diabetes Mother    Asthma Father    Colon cancer Neg Hx    Stomach cancer Neg Hx    Esophageal cancer Neg Hx    Pancreatic cancer Neg Hx    Liver disease Neg Hx     Past  Surgical History:  Procedure Laterality Date   CHOLECYSTECTOMY     approx 6 yrs ago in [place]   COLONOSCOPY     approx 15 years ago in [place]    ROS: Review of Systems Negative except as stated above  PHYSICAL EXAM: BP 131/76   Pulse 62   Temp 98.1 F (36.7 C) (Oral)   Ht 5\' 2"  (1.575 m)   Wt 164 lb (74.4 kg)   SpO2 98%   BMI 30.00 kg/m   Physical Exam  General  appearance - alert, well appearing, and in no distress Mental status - normal mood, behavior, speech, dress, motor activity, and thought processes Chest - clear to auscultation, no wheezes, rales or rhonchi, symmetric air entry Heart - normal rate, regular rhythm, normal S1, S2, no murmurs, rubs, clicks or gallops Musculoskeletal -she has swan-neck deformities of the fingers with some ulnar deviation Extremities -no lower extremity edema.      Latest Ref Rng & Units 05/04/2023    4:42 PM 04/06/2022   11:02 AM 01/12/2022    9:34 AM  CMP  Glucose 70 - 99 mg/dL 161  096  045   BUN 8 - 27 mg/dL 10  13  7    Creatinine 0.57 - 1.00 mg/dL 4.09  8.11  9.14   Sodium 134 - 144 mmol/L 138  140  142   Potassium 3.5 - 5.2 mmol/L 3.9  4.2  4.0   Chloride 96 - 106 mmol/L 102  105  106   CO2 20 - 29 mmol/L 21  23  24    Calcium 8.7 - 10.3 mg/dL 9.8  9.3  9.2   Total Protein 6.0 - 8.5 g/dL 8.0  7.9  7.7   Total Bilirubin 0.0 - 1.2 mg/dL 0.2  0.2  0.5   Alkaline Phos 44 - 121 IU/L 139  136  132   AST 0 - 40 IU/L 58  44  30   ALT 0 - 32 IU/L 72  54  37    Lipid Panel     Component Value Date/Time   CHOL 187 05/04/2023 1642   TRIG 172 (H) 05/04/2023 1642   HDL 47 05/04/2023 1642   CHOLHDL 4.0 05/04/2023 1642   CHOLHDL 4.6 12/13/2016 1149   VLDL 41 (H) 12/13/2016 1149   LDLCALC 110 (H) 05/04/2023 1642    CBC    Component Value Date/Time   WBC 5.7 10/03/2022 0950   WBC 8.1 10/05/2020 1615   RBC 4.29 10/03/2022 0950   RBC 4.05 10/05/2020 1615   HGB 12.3 10/03/2022 0950   HCT 37.5 10/03/2022 0950   PLT 323 10/03/2022 0950    MCV 87 10/03/2022 0950   MCH 28.7 10/03/2022 0950   MCH 28.8 12/13/2016 1149   MCHC 32.8 10/03/2022 0950   MCHC 33.1 10/05/2020 1615   RDW 13.9 10/03/2022 0950   LYMPHSABS 2.3 10/05/2020 1615   MONOABS 0.4 10/05/2020 1615   EOSABS 0.2 10/05/2020 1615   BASOSABS 0.1 10/05/2020 1615    ASSESSMENT AND PLAN: 1. Type 2 diabetes mellitus with hyperlipidemia (HCC) A1c is significantly improved since last visit.  I have commended her on this and the significant changes that she has made and eating habits.  Encouraged her to keep up the good work.  Encouraged her to continue regular exercise as well. Increase glargine insulin to 9 units daily.  Continue Janumet twice a day. Discussed getting her a continuous glucose monitor and patient was agreeable to this.  She has an iPhone so would be able to set up the app on her phone to be used as a reader. Refill atorvastatin. - POCT glycosylated hemoglobin (Hb A1C) - POCT glucose (manual entry) - Continuous Glucose Sensor (FREESTYLE LIBRE 3 SENSOR) MISC; Place 1 sensor on the skin every 14 days. Use to check glucose continuously  Dispense: 2 each; Refill: 11 - atorvastatin (LIPITOR) 20 MG tablet; Take 1 tablet (20 mg total) by mouth daily.  Dispense: 90 tablet; Refill: 1 - insulin glargine (LANTUS SOLOSTAR) 100 UNIT/ML  Solostar Pen; Inject 9 Units into the skin daily.  Dispense: 15 mL; Refill: PRN  2. Insulin long-term use (HCC) See #1 above.  3. Diabetes mellitus treated with oral medication (HCC) See #1 above.  4. Rheumatoid arthritis involving multiple sites with positive rheumatoid factor (HCC) Followed by rheumatology.  Advised that she should no longer need folic acid if she is not on methotrexate.  She can confirm with her rheumatologist.  5. Influenza vaccination declined Recommended.  Patient declined.  6. Abnormal LFTs - Hepatic Function Panel; Future  7. Elevated blood pressure reading without diagnosis of hypertension DASH diet  discussed and encouraged.  Will have her follow-up with clinical pharmacist in 1 month for recheck.     Patient was given the opportunity to ask questions.  Patient verbalized understanding of the plan and was able to repeat key elements of the plan.   This documentation was completed using Paediatric nurse.  Any transcriptional errors are unintentional.  Orders Placed This Encounter  Procedures   Hepatic Function Panel   POCT glycosylated hemoglobin (Hb A1C)   POCT glucose (manual entry)     Requested Prescriptions   Signed Prescriptions Disp Refills   Continuous Glucose Sensor (FREESTYLE LIBRE 3 SENSOR) MISC 2 each 11    Sig: Place 1 sensor on the skin every 14 days. Use to check glucose continuously   atorvastatin (LIPITOR) 20 MG tablet 90 tablet 1    Sig: Take 1 tablet (20 mg total) by mouth daily.   insulin glargine (LANTUS SOLOSTAR) 100 UNIT/ML Solostar Pen 15 mL PRN    Sig: Inject 9 Units into the skin daily.    Return in about 4 months (around 01/06/2024) for Medicare Wellness Visit in   2-3 wks with CMA, Franky Macho in 1 mth for BP check.  Jonah Blue, MD, FACP

## 2023-09-06 ENCOUNTER — Other Ambulatory Visit: Payer: Self-pay

## 2023-09-08 ENCOUNTER — Ambulatory Visit: Payer: Medicare Other | Attending: Internal Medicine

## 2023-09-08 ENCOUNTER — Other Ambulatory Visit: Payer: Self-pay

## 2023-09-08 DIAGNOSIS — R7989 Other specified abnormal findings of blood chemistry: Secondary | ICD-10-CM

## 2023-09-09 LAB — HEPATIC FUNCTION PANEL
ALT: 19 [IU]/L (ref 0–32)
AST: 19 [IU]/L (ref 0–40)
Albumin: 4.1 g/dL (ref 3.9–4.9)
Alkaline Phosphatase: 80 [IU]/L (ref 44–121)
Bilirubin Total: 0.2 mg/dL (ref 0.0–1.2)
Bilirubin, Direct: <0.1 mg/dL (ref 0.00–0.40)
Total Protein: 7.4 g/dL (ref 6.0–8.5)

## 2023-10-05 NOTE — Progress Notes (Deleted)
   S:     No chief complaint on file.  62 y.o. female who presents for hypertension evaluation, education, and management.  PMH is significant for Diabetes, HLD, and obesity (BMI 30.00 kg/m^2). Patient was referred and last seen by Primary Care Provider, Dr. Laural Benes, on 09/05/2023 where BP was slightly elevated at 131/76.   Today, patient arrives in *** spirits and presents without *** assistance. *** Denies dizziness, headache, blurred vision, swelling.   Family/Social history:  Diabetes (mother)  Medication adherence *** . Patient has *** taken BP medications today.   Current antihypertensives include: None  Antihypertensives tried in the past include: None   Reported home BP readings: ***  Patient reported dietary habits: Eats *** meals/day Breakfast: *** Lunch: *** Dinner: *** Snacks: *** Drinks: ***  Patient-reported exercise habits: ***  ASCVD risk factors include: Diabetes Mellitus    Last 3 Office BP readings: BP Readings from Last 3 Encounters:  09/05/23 131/76  05/04/23 97/67  04/03/23 116/73    BMET    Component Value Date/Time   NA 138 05/04/2023 1642   K 3.9 05/04/2023 1642   CL 102 05/04/2023 1642   CO2 21 05/04/2023 1642   GLUCOSE 179 (H) 05/04/2023 1642   GLUCOSE 89 10/05/2020 1615   BUN 10 05/04/2023 1642   CREATININE 0.63 05/04/2023 1642   CREATININE 0.80 12/13/2016 1149   CALCIUM 9.8 05/04/2023 1642   GFRNONAA 96 07/24/2020 1218   GFRNONAA 83 12/13/2016 1149   GFRAA 111 07/24/2020 1218   GFRAA >89 12/13/2016 1149    Renal function: CrCl cannot be calculated (Patient's most recent lab result is older than the maximum 21 days allowed.).  Clinical ASCVD: No  The 10-year ASCVD risk score (Arnett DK, et al., 2019) is: 8.4%   Values used to calculate the score:     Age: 8 years     Sex: Female     Is Non-Hispanic African American: No     Diabetic: Yes     Tobacco smoker: No     Systolic Blood Pressure: 131 mmHg     Is BP treated:  No     HDL Cholesterol: 47 mg/dL     Total Cholesterol: 187 mg/dL  Patient is participating in a medicare part A and B and Medicaid    A/P: Hypertension diagnosed *** currently *** on current medications. BP goal < 130/80 *** mmHg. Medication adherence appears ***. Control is suboptimal due to ***.  -{Meds adjust:18428} ***.  -{Meds adjust:18428} ***.  -Patient educated on purpose, proper use, and potential adverse effects of ***.  -F/u labs ordered - *** -Counseled on lifestyle modifications for blood pressure control including reduced dietary sodium, increased exercise, adequate sleep. -Encouraged patient to check BP at home and bring log of readings to next visit. Counseled on proper use of home BP cuff.   Results reviewed and written information provided.    Written patient instructions provided. Patient verbalized understanding of treatment plan.  Total time in face to face counseling *** minutes.    Follow-up:  Pharmacist ***. PCP clinic visit in 01/09/2024.   Patient seen with:  Erasmo Leventhal, PharmD Candidate  Class of 2025 [employer] [facility] SOP   Butch Penny, PharmD, [place], CPP Clinical Pharmacist [facility] [phone]

## 2023-10-06 ENCOUNTER — Ambulatory Visit: Payer: Medicare Other | Admitting: Pharmacist

## 2023-10-12 ENCOUNTER — Other Ambulatory Visit: Payer: Self-pay

## 2023-11-20 ENCOUNTER — Other Ambulatory Visit: Payer: Self-pay

## 2023-11-28 ENCOUNTER — Other Ambulatory Visit: Payer: Self-pay

## 2023-12-04 ENCOUNTER — Other Ambulatory Visit: Payer: Self-pay

## 2023-12-08 ENCOUNTER — Other Ambulatory Visit: Payer: Self-pay

## 2024-01-01 DIAGNOSIS — M810 Age-related osteoporosis without current pathological fracture: Secondary | ICD-10-CM | POA: Diagnosis not present

## 2024-01-09 ENCOUNTER — Ambulatory Visit: Payer: Medicare Other | Attending: Internal Medicine | Admitting: Internal Medicine

## 2024-01-09 ENCOUNTER — Encounter: Payer: Self-pay | Admitting: Internal Medicine

## 2024-01-09 ENCOUNTER — Other Ambulatory Visit: Payer: Self-pay

## 2024-01-09 VITALS — BP 130/72 | HR 72 | Temp 98.1°F | Ht 62.0 in | Wt 165.0 lb

## 2024-01-09 DIAGNOSIS — Z794 Long term (current) use of insulin: Secondary | ICD-10-CM | POA: Diagnosis not present

## 2024-01-09 DIAGNOSIS — Z23 Encounter for immunization: Secondary | ICD-10-CM

## 2024-01-09 DIAGNOSIS — I152 Hypertension secondary to endocrine disorders: Secondary | ICD-10-CM | POA: Diagnosis not present

## 2024-01-09 DIAGNOSIS — M0579 Rheumatoid arthritis with rheumatoid factor of multiple sites without organ or systems involvement: Secondary | ICD-10-CM | POA: Diagnosis not present

## 2024-01-09 DIAGNOSIS — E1169 Type 2 diabetes mellitus with other specified complication: Secondary | ICD-10-CM | POA: Diagnosis not present

## 2024-01-09 DIAGNOSIS — Z7984 Long term (current) use of oral hypoglycemic drugs: Secondary | ICD-10-CM

## 2024-01-09 DIAGNOSIS — E785 Hyperlipidemia, unspecified: Secondary | ICD-10-CM

## 2024-01-09 DIAGNOSIS — E119 Type 2 diabetes mellitus without complications: Secondary | ICD-10-CM

## 2024-01-09 DIAGNOSIS — K859 Acute pancreatitis without necrosis or infection, unspecified: Secondary | ICD-10-CM | POA: Insufficient documentation

## 2024-01-09 DIAGNOSIS — Z1331 Encounter for screening for depression: Secondary | ICD-10-CM | POA: Diagnosis not present

## 2024-01-09 DIAGNOSIS — M0589 Other rheumatoid arthritis with rheumatoid factor of multiple sites: Secondary | ICD-10-CM | POA: Insufficient documentation

## 2024-01-09 DIAGNOSIS — Z79620 Long term (current) use of immunosuppressive biologic: Secondary | ICD-10-CM | POA: Insufficient documentation

## 2024-01-09 DIAGNOSIS — M810 Age-related osteoporosis without current pathological fracture: Secondary | ICD-10-CM | POA: Diagnosis not present

## 2024-01-09 DIAGNOSIS — M069 Rheumatoid arthritis, unspecified: Secondary | ICD-10-CM | POA: Diagnosis present

## 2024-01-09 LAB — POCT GLYCOSYLATED HEMOGLOBIN (HGB A1C): HbA1c, POC (controlled diabetic range): 8.3 % — AB (ref 0.0–7.0)

## 2024-01-09 LAB — GLUCOSE, POCT (MANUAL RESULT ENTRY): POC Glucose: 157 mg/dL — AB (ref 70–99)

## 2024-01-09 MED ORDER — LANTUS SOLOSTAR 100 UNIT/ML ~~LOC~~ SOPN
10.0000 [IU] | PEN_INJECTOR | Freq: Every day | SUBCUTANEOUS | 99 refills | Status: DC
  Filled 2024-01-09: qty 15, 150d supply, fill #0

## 2024-01-09 MED ORDER — ZOSTER VAC RECOMB ADJUVANTED 50 MCG/0.5ML IM SUSR: 0.5000 mL | Freq: Once | INTRAMUSCULAR | 0 refills | Status: AC

## 2024-01-09 MED ORDER — JANUMET 50-1000 MG PO TABS
1.0000 | ORAL_TABLET | Freq: Two times a day (BID) | ORAL | 1 refills | Status: DC
Start: 2024-01-09 — End: 2024-05-27
  Filled 2024-01-09 – 2024-03-07 (×2): qty 180, 90d supply, fill #0

## 2024-01-09 MED ORDER — ATORVASTATIN CALCIUM 20 MG PO TABS
20.0000 mg | ORAL_TABLET | Freq: Every day | ORAL | 1 refills | Status: DC
  Filled 2024-01-09: qty 90, 90d supply, fill #0

## 2024-01-09 NOTE — Progress Notes (Signed)
Patient ID: Natalie Cherry, female    DOB: 1961/04/15  MRN: [id number]  CC: Diabetes (DM f/u. Med refill. /No questions / concerns/Yes to pneumonia vax. Yes to shingles vax)   Subjective: Natalie Cherry is a 63 y.o. female who presents for chronic ds management. Her concerns today include:  Pt with hx of DM, HL, RA, osteoporosis on Prolia, acute pancreatitis   [employer] Language interpreter used during this encounter. #[id number] Zulma  HM:  due for 2nd shingrix and PCV.  Due for DM eye exam.  Cologuard ordered on last visit 4 mths ago; received it and sent it in but I did not receive it. I will have my CMA call [employer] about this.  DM:  Results for orders placed or performed in visit on 01/09/24  POCT glucose (manual entry)   Collection Time: 01/09/24 10:50 AM  Result Value Ref Range   POC Glucose 157 (A) 70 - 99 mg/dl  POCT glycosylated hemoglobin (Hb A1C)   Collection Time: 01/09/24 10:56 AM  Result Value Ref Range   Hemoglobin A1C     HbA1c POC (<> result, manual entry)     HbA1c, POC (prediabetic range)     HbA1c, POC (controlled diabetic range) 8.3 (A) 0.0 - 7.0 %  Should be on Glargine insulin 9 units daily (taking 8 units) and Janumet 50/1000 mg BID; reports compliance but forgot that I had recommended increase glargine to 9 units  -CGM prescribed on last visit.  Said her daughter was not able to get from pharmacy. -checks BS every other day in the mornings; gives range 116-156.  Reports A1C is up today because she over ate during the christmas holidays, especially sweets.  Started this mth trying to go back to healthier routine. Also not getting in much exercise due to cold weather; prior to winter she was walking daily  RA: followed by Dr. Kathi Ludwig. Last seen 10/2023. On Embrel and Plaquenil and doing well. Still gets Prolia injections Q 6 mths.  Endorses taking a MV tab that has Vit D in it.    BP was elev on last visit.  DASH was discussed.  She did cut back on salt.  She has  been checking BP 3x/wk and gives SBP readings of 120-130s.  Last reading was 122  + depression screen in past.  PHQ9 score lower today. No issues at this time. Patient Active Problem List   Diagnosis Date Noted   History of acute pancreatitis 10/03/2022   Hyperlipidemia associated with type 2 diabetes mellitus (HCC) 07/24/2020   Over weight 07/24/2020   Influenza vaccination declined 07/24/2020   Osteoporosis without current pathological fracture 12/07/2018   Type 2 diabetes mellitus with hyperglycemia, without long-term current use of insulin (HCC) 11/06/2015   Rheumatoid arthritis (HCC) 01/29/2014   Vitamin D deficiency 01/29/2014   Dental caries 03/02/2009   Osteoarthritis of spine 10/05/2007     Current Outpatient Medications on File Prior to Visit  Medication Sig Dispense Refill   Accu-Chek Softclix Lancets lancets Use as instructed to check blood sugar three times daily. E11.9 100 each 12   acetaminophen-codeine (TYLENOL #3) 300-30 MG tablet Take 1 tablet by mouth every 12 (twelve) hours as needed for moderate pain. 60 tablet 0   Blood Glucose Monitoring Suppl (ACCU-CHEK GUIDE) w/Device KIT Use as instructed to check blood sugar three times daily. 1 kit 0   cetirizine (ZYRTEC ALLERGY) 10 MG tablet Take 1 tablet (10 mg total) by mouth daily as needed for  allergies. 90 tablet 3   Continuous Glucose Sensor (FREESTYLE LIBRE 3 SENSOR) MISC Place 1 sensor on the skin every 14 days. Use to check glucose continuously 2 each 11   denosumab (PROLIA) 60 MG/ML SOSY injection Inject 60 mg into the skin every 6 (six) months.     diclofenac Sodium (VOLTAREN) 1 % GEL Apply 2 g topically 4 (four) times daily. 100 g 2   etanercept (ENBREL SURECLICK) 50 MG/ML injection      glucose blood (ACCU-CHEK GUIDE) test strip Use as instructed to check blood sugar three times daily. E11.9 100 each 2   hydroxychloroquine (PLAQUENIL) 200 MG tablet Take 200 mg by mouth daily.     Insulin Pen Needle (BD PEN NEEDLE  NANO U/F) 32G X 4 MM MISC Use as directed with insulin pen 100 each 1   No current facility-administered medications on file prior to visit.    No Known Allergies  Social History   Socioeconomic History   Marital status: Married    Spouse name: Not on file   Number of children: Not on file   Years of education: Not on file   Highest education level: Not on file  Occupational History   Not on file  Tobacco Use   Smoking status: Never   Smokeless tobacco: Never  Vaping Use   Vaping status: Never Used  Substance and Sexual Activity   Alcohol use: No   Drug use: No   Sexual activity: Not Currently  Other Topics Concern   Not on file  Social History Narrative   Not on file   Social Drivers of Health   Financial Resource Strain: Medium Risk (09/05/2023)   Overall Financial Resource Strain (CARDIA)    Difficulty of Paying Living Expenses: Somewhat hard  Food Insecurity: Food Insecurity Present (09/05/2023)   Hunger Vital Sign    Worried About Running Out of Food in the Last Year: Sometimes true    Ran Out of Food in the Last Year: Sometimes true  Transportation Needs: No Transportation Needs (09/05/2023)   PRAPARE - Administrator, Civil Service (Medical): No    Lack of Transportation (Non-Medical): No  Physical Activity: Inactive (09/05/2023)   Exercise Vital Sign    Days of Exercise per Week: 0 days    Minutes of Exercise per Session: 0 min  Stress: Stress Concern Present (09/05/2023)   [employer] of Occupational Health - Occupational Stress Questionnaire    Feeling of Stress : Rather much  Social Connections: Socially Integrated (09/05/2023)   Social Connection and Isolation Panel [NHANES]    Frequency of Communication with Friends and Family: More than three times a week    Frequency of Social Gatherings with Friends and Family: Three times a week    Attends Religious Services: More than 4 times per year    Active Member of Clubs or Organizations: Yes     Attends Banker Meetings: More than 4 times per year    Marital Status: Married  Catering manager Violence: Not At Risk (09/05/2023)   Humiliation, Afraid, Rape, and Kick questionnaire    Fear of Current or Ex-Partner: No    Emotionally Abused: No    Physically Abused: No    Sexually Abused: No    Family History  Problem Relation Age of Onset   Diabetes Mother    Asthma Father    Colon cancer Neg Hx    Stomach cancer Neg Hx    Esophageal cancer Neg Hx  Pancreatic cancer Neg Hx    Liver disease Neg Hx     Past Surgical History:  Procedure Laterality Date   CHOLECYSTECTOMY     approx 6 yrs ago in [place]   COLONOSCOPY     approx 15 years ago in [place]    ROS: Review of Systems Negative except as stated above  PHYSICAL EXAM: BP 130/72   Pulse 72   Temp 98.1 F (36.7 C) (Oral)   Ht 5\' 2"  (1.575 m)   Wt 165 lb (74.8 kg)   SpO2 99%   BMI 30.18 kg/m   Physical Exam  General appearance - alert, well appearing, and in no distress Mental status - normal mood, behavior, speech, dress, motor activity, and thought processes Neck - supple, no significant adenopathy Chest - clear to auscultation, no wheezes, rales or rhonchi, symmetric air entry Heart - normal rate, regular rhythm, normal S1, S2, no murmurs, rubs, clicks or gallops Musculoskeletal -hands: Enlargement of MCP joints and swan-neck deformities at the PIP and DIP joints Extremities -no lower extremity edema Diabetic Foot Exam - Simple   Simple Foot Form Diabetic Foot exam was performed with the following findings: Yes 01/09/2024 11:21 AM  Visual Inspection See comments: Yes Sensation Testing Intact to touch and monofilament testing bilaterally: Yes Pulse Check Posterior Tibialis and Dorsalis pulse intact bilaterally: Yes Comments Mild swelling anterior to lateral malleolus LT foot       01/09/2024   11:45 AM 05/04/2023    3:42 PM 04/03/2023    2:39 PM  Depression screen PHQ 2/9  Decreased  Interest 2 2 2   Down, Depressed, Hopeless 1 2 2   PHQ - 2 Score 3 4 4   Altered sleeping 1 1 2   Tired, decreased energy 1 2 2   Change in appetite 0 1 1  Feeling bad or failure about yourself  1 1 2   Trouble concentrating 1 1 2   Moving slowly or fidgety/restless 0 0 0  Suicidal thoughts 0 0 0  PHQ-9 Score 7 10 13   Difficult doing work/chores Somewhat difficult          Latest Ref Rng & Units 09/08/2023   10:01 AM 05/04/2023    4:42 PM 04/06/2022   11:02 AM  CMP  Glucose 70 - 99 mg/dL  604  540   BUN 8 - 27 mg/dL  10  13   Creatinine 9.81 - 1.00 mg/dL  1.91  4.78   Sodium 295 - 144 mmol/L  138  140   Potassium 3.5 - 5.2 mmol/L  3.9  4.2   Chloride 96 - 106 mmol/L  102  105   CO2 20 - 29 mmol/L  21  23   Calcium 8.7 - 10.3 mg/dL  9.8  9.3   Total Protein 6.0 - 8.5 g/dL 7.4  8.0  7.9   Total Bilirubin 0.0 - 1.2 mg/dL 0.2  0.2  0.2   Alkaline Phos 44 - 121 IU/L 80  139  136   AST 0 - 40 IU/L 19  58  44   ALT 0 - 32 IU/L 19  72  54    Lipid Panel     Component Value Date/Time   CHOL 187 05/04/2023 1642   TRIG 172 (H) 05/04/2023 1642   HDL 47 05/04/2023 1642   CHOLHDL 4.0 05/04/2023 1642   CHOLHDL 4.6 12/13/2016 1149   VLDL 41 (H) 12/13/2016 1149   LDLCALC 110 (H) 05/04/2023 1642    CBC    Component  Value Date/Time   WBC 5.7 10/03/2022 0950   WBC 8.1 10/05/2020 1615   RBC 4.29 10/03/2022 0950   RBC 4.05 10/05/2020 1615   HGB 12.3 10/03/2022 0950   HCT 37.5 10/03/2022 0950   PLT 323 10/03/2022 0950   MCV 87 10/03/2022 0950   MCH 28.7 10/03/2022 0950   MCH 28.8 12/13/2016 1149   MCHC 32.8 10/03/2022 0950   MCHC 33.1 10/05/2020 1615   RDW 13.9 10/03/2022 0950   LYMPHSABS 2.3 10/05/2020 1615   MONOABS 0.4 10/05/2020 1615   EOSABS 0.2 10/05/2020 1615   BASOSABS 0.1 10/05/2020 1615    ASSESSMENT AND PLAN: 1. Type 2 diabetes mellitus with hyperlipidemia (HCC) (Primary) Not at goal due to dietary indiscretion and not getting as much exercise over the past few  months.  Encouraged her to get back to healthy eating habits.  Increase Lantus insulin to 10 units daily.  Advised patient that if she finds blood sugars in the mornings are too low at this dose, she can decrease it by 1 unit.  Continue Janumet - POCT glycosylated hemoglobin (Hb A1C) - POCT glucose (manual entry) - atorvastatin (LIPITOR) 20 MG tablet; Take 1 tablet (20 mg total) by mouth daily.  Dispense: 90 tablet; Refill: 1 - insulin glargine (LANTUS SOLOSTAR) 100 UNIT/ML Solostar Pen; Inject 10 Units into the skin daily.  Dispense: 15 mL; Refill: PRN - sitaGLIPtin-metformin (JANUMET) 50-1000 MG tablet; Take 1 tablet by mouth 2 (two) times daily with a meal.  Dispense: 180 tablet; Refill: 1 - Ambulatory referral to Ophthalmology  2. Diabetes mellitus treated with oral medication (HCC) 3. Insulin long-term use (HCC) See #1 above  4. Hypertension associated with type 2 diabetes mellitus (HCC) Repeat blood pressure today was 130/72.  Advised to continue DASH diet.  We will hold off on prescribing medication at this time  5. Rheumatoid arthritis involving multiple sites with positive rheumatoid factor (HCC) Stable on Enbrel and Plaquenil through her rheumatologist  6. Positive depression screening Patient reports this is not a major issue for her at this time.  7. Need for shingles vaccine Given prescription to get shingles vaccine at any outside pharmacy. - Zoster Vaccine Adjuvanted [facility]) injection; Inject 0.5 mLs into the muscle once for 1 dose.  Dispense: 0.5 mL; Refill: 0  8. Need for vaccination against Streptococcus pneumoniae - PNEUMOCOCCAL CONJUGATE VACCINE 15-VALENT   Patient was given the opportunity to ask questions.  Patient verbalized understanding of the plan and was able to repeat key elements of the plan.   This documentation was completed using Paediatric nurse.  Any transcriptional errors are unintentional.  Orders Placed This Encounter   Procedures   PNEUMOCOCCAL CONJUGATE VACCINE 15-VALENT   Ambulatory referral to Ophthalmology   POCT glycosylated hemoglobin (Hb A1C)   POCT glucose (manual entry)     Requested Prescriptions   Signed Prescriptions Disp Refills   atorvastatin (LIPITOR) 20 MG tablet 90 tablet 1    Sig: Take 1 tablet (20 mg total) by mouth daily.   insulin glargine (LANTUS SOLOSTAR) 100 UNIT/ML Solostar Pen 15 mL PRN    Sig: Inject 10 Units into the skin daily.   sitaGLIPtin-metformin (JANUMET) 50-1000 MG tablet 180 tablet 1    Sig: Take 1 tablet by mouth 2 (two) times daily with a meal.   Zoster Vaccine Adjuvanted (SHINGRIX) injection 0.5 mL 0    Sig: Inject 0.5 mLs into the muscle once for 1 dose.    Return in about 4 months (  around 05/08/2024) for Medicare Wellness Visit in 2-4  wks with CMA.  Jonah Blue, MD, FACP

## 2024-01-11 DIAGNOSIS — Z1212 Encounter for screening for malignant neoplasm of rectum: Secondary | ICD-10-CM | POA: Diagnosis not present

## 2024-01-11 DIAGNOSIS — Z1211 Encounter for screening for malignant neoplasm of colon: Secondary | ICD-10-CM | POA: Diagnosis not present

## 2024-01-17 ENCOUNTER — Telehealth: Payer: Self-pay | Admitting: Internal Medicine

## 2024-01-17 NOTE — Telephone Encounter (Signed)
-----   Message from [facility] Clarisa A sent at 01/16/2024  4:50 PM EST ----- Regarding: RE: Cologuard Called & spoke to an [employer] rep. She stated that the test was actually received at their lab yesterday. The results are a 14 day turn around time. They will send you the results when it's ready ----- Message ----- From: Marcine Matar, MD Sent: 01/09/2024  11:30 AM EST To: Johna Roles, CMA Subject: Cologuard                                      Pt states she received, used and mailed in cologuard test that I ordered in November.  Please call [employer] to inquire if they received it.  I never received results.

## 2024-01-18 LAB — COLOGUARD: COLOGUARD: NEGATIVE

## 2024-03-07 ENCOUNTER — Other Ambulatory Visit: Payer: Self-pay

## 2024-03-08 ENCOUNTER — Other Ambulatory Visit: Payer: Self-pay

## 2024-05-27 ENCOUNTER — Other Ambulatory Visit: Payer: Self-pay

## 2024-05-27 ENCOUNTER — Other Ambulatory Visit: Payer: Self-pay | Admitting: Pharmacist

## 2024-05-27 ENCOUNTER — Ambulatory Visit: Admitting: Internal Medicine

## 2024-05-27 ENCOUNTER — Other Ambulatory Visit (HOSPITAL_COMMUNITY)
Admission: RE | Admit: 2024-05-27 | Discharge: 2024-05-27 | Disposition: A | Discharge status: Home / Self Care | Source: Ambulatory Visit | Attending: Internal Medicine | Admitting: Internal Medicine

## 2024-05-27 ENCOUNTER — Encounter: Payer: Self-pay | Admitting: Internal Medicine

## 2024-05-27 VITALS — BP 119/71 | HR 71 | Temp 98.2°F | Ht 62.0 in | Wt 160.0 lb

## 2024-05-27 DIAGNOSIS — E1169 Type 2 diabetes mellitus with other specified complication: Secondary | ICD-10-CM

## 2024-05-27 DIAGNOSIS — Z79899 Other long term (current) drug therapy: Secondary | ICD-10-CM | POA: Insufficient documentation

## 2024-05-27 DIAGNOSIS — E119 Type 2 diabetes mellitus without complications: Secondary | ICD-10-CM

## 2024-05-27 DIAGNOSIS — E1165 Type 2 diabetes mellitus with hyperglycemia: Secondary | ICD-10-CM | POA: Diagnosis not present

## 2024-05-27 DIAGNOSIS — Z7984 Long term (current) use of oral hypoglycemic drugs: Secondary | ICD-10-CM | POA: Diagnosis not present

## 2024-05-27 DIAGNOSIS — R350 Frequency of micturition: Secondary | ICD-10-CM | POA: Insufficient documentation

## 2024-05-27 DIAGNOSIS — E1159 Type 2 diabetes mellitus with other circulatory complications: Secondary | ICD-10-CM | POA: Insufficient documentation

## 2024-05-27 DIAGNOSIS — E785 Hyperlipidemia, unspecified: Secondary | ICD-10-CM

## 2024-05-27 DIAGNOSIS — M0579 Rheumatoid arthritis with rheumatoid factor of multiple sites without organ or systems involvement: Secondary | ICD-10-CM

## 2024-05-27 DIAGNOSIS — I152 Hypertension secondary to endocrine disorders: Secondary | ICD-10-CM | POA: Insufficient documentation

## 2024-05-27 DIAGNOSIS — R300 Dysuria: Secondary | ICD-10-CM

## 2024-05-27 DIAGNOSIS — N941 Unspecified dyspareunia: Secondary | ICD-10-CM | POA: Diagnosis not present

## 2024-05-27 DIAGNOSIS — N898 Other specified noninflammatory disorders of vagina: Secondary | ICD-10-CM | POA: Diagnosis not present

## 2024-05-27 DIAGNOSIS — Z1231 Encounter for screening mammogram for malignant neoplasm of breast: Secondary | ICD-10-CM | POA: Insufficient documentation

## 2024-05-27 DIAGNOSIS — Z794 Long term (current) use of insulin: Secondary | ICD-10-CM | POA: Diagnosis not present

## 2024-05-27 LAB — POCT GLYCOSYLATED HEMOGLOBIN (HGB A1C): HbA1c, POC (controlled diabetic range): 12 % — AB (ref 0.0–7.0)

## 2024-05-27 LAB — POCT URINALYSIS DIP (CLINITEK)
Bilirubin, UA: NEGATIVE
Glucose, UA: >=1000 mg/dL — AB
Leukocytes, UA: NEGATIVE
Nitrite, UA: POSITIVE — AB
POC PROTEIN,UA: NEGATIVE
Spec Grav, UA: 1.015 (ref 1.010–1.025)
Urobilinogen, UA: 0.2 U/dL
pH, UA: 5.5 (ref 5.0–8.0)

## 2024-05-27 LAB — GLUCOSE, POCT (MANUAL RESULT ENTRY): POC Glucose: 295 mg/dL — AB (ref 70–99)

## 2024-05-27 MED ORDER — INSULIN GLARGINE-YFGN 100 UNIT/ML ~~LOC~~ SOPN
PEN_INJECTOR | SUBCUTANEOUS | 0 refills | Status: DC
  Filled 2024-05-27: qty 9, 30d supply, fill #0

## 2024-05-27 MED ORDER — ATORVASTATIN CALCIUM 20 MG PO TABS
20.0000 mg | ORAL_TABLET | Freq: Every day | ORAL | 1 refills | Status: DC
  Filled 2024-05-27: qty 90, 90d supply, fill #0

## 2024-05-27 MED ORDER — FREESTYLE LIBRE 3 PLUS SENSOR MISC
11 refills | Status: DC
  Filled 2024-05-27: qty 2, 30d supply, fill #0

## 2024-05-27 MED ORDER — INSULIN GLARGINE-YFGN 100 UNIT/ML ~~LOC~~ SOPN
12.0000 [IU] | PEN_INJECTOR | Freq: Every day | SUBCUTANEOUS | 0 refills | Status: DC
  Filled 2024-05-27: qty 15, 125d supply, fill #0

## 2024-05-27 MED ORDER — CIPROFLOXACIN HCL 500 MG PO TABS
500.0000 mg | ORAL_TABLET | Freq: Two times a day (BID) | ORAL | 0 refills | Status: AC
  Filled 2024-05-27: qty 14, 7d supply, fill #0

## 2024-05-27 MED ORDER — FLUCONAZOLE 150 MG PO TABS
150.0000 mg | ORAL_TABLET | Freq: Every day | ORAL | 0 refills | Status: DC
  Filled 2024-05-27: qty 1, 1d supply, fill #0

## 2024-05-27 MED ORDER — JANUMET 50-1000 MG PO TABS
1.0000 | ORAL_TABLET | Freq: Two times a day (BID) | ORAL | 1 refills | Status: AC
  Filled 2024-05-27: qty 180, 90d supply, fill #0
  Filled 2024-09-02: qty 180, 90d supply, fill #1

## 2024-05-27 MED ORDER — LANTUS SOLOSTAR 100 UNIT/ML ~~LOC~~ SOPN
12.0000 [IU] | PEN_INJECTOR | Freq: Every day | SUBCUTANEOUS | 99 refills | Status: DC
  Filled 2024-05-27: qty 9, 75d supply, fill #0

## 2024-05-27 NOTE — Progress Notes (Signed)
 Patient ID: Natalie Cherry, female    DOB: 01/03/1961  MRN: [id number]  CC: Diabetes (DM  f/u. Med refill. Layvonne rx for omeprazole  /Vaginal itching, painful intercourse, burning while urinating, urinary frequency x1 week)   Subjective: Natalie Cherry is a 63 y.o. female who presents for chronic ds management. Marta, from [facility] is with her and interprets.  Her concerns today include:  Pt with hx of DM, HL, RA, osteoporosis on Prolia , acute pancreatitis   Discussed the use of AI scribe software for clinical note transcription with the patient, who gave verbal consent to proceed.  History of Present Illness Natalie Cherry is a 63 year old female with diabetes and hypertension who presents for chronic ds management..  She experiences significant vaginal itching primarily on the external area, with additional internal itching for over a week. Burning during urination and dyspareunia are also present over same time period.  Her diabetes management includes Glargine insulin  at 8 units daily (suppose to be 10 units based on instructions from last visit).  Patient states she was just following the instructions on the bottle.  Her previous dose was 8 units but we had told her on last visit to increase to 10 units and an updated prescription was sent to her pharmacy with that instructions.  However she has not had to refill since last visit.  Her A1c has increased to 12 from 8.3 in February, with blood sugar readings between 160 and 178. She missed her medication and insulin  for past two days during a vacation trip. She takes Janumet  twice daily She admits to drinking soda mainly Coca-Cola regularly. HL: Reports compliance with atorvastatin  for cholesterol.  RA: She is still followed by rheumatology.  Reports doing well on Enbrel and Plaquenil.  HM: Due for mammogram.  Reports having had her second Shingrix  vaccine through [facility] [employer].  Due for diabetic eye exam.     Patient Active  Problem List   Diagnosis Date Noted   History of acute pancreatitis 10/03/2022   Hyperlipidemia associated with type 2 diabetes mellitus (HCC) 07/24/2020   Over weight 07/24/2020   Influenza vaccination declined 07/24/2020   Osteoporosis without current pathological fracture 12/07/2018   Type 2 diabetes mellitus with hyperglycemia, without long-term current use of insulin  (HCC) 11/06/2015   Rheumatoid arthritis (HCC) 01/29/2014   Vitamin D  deficiency 01/29/2014   Dental caries 03/02/2009   Osteoarthritis of spine 10/05/2007     Current Outpatient Medications on File Prior to Visit  Medication Sig Dispense Refill   Accu-Chek Softclix Lancets lancets Use as instructed to check blood sugar three times daily. E11.9 100 each 12   acetaminophen -codeine  (TYLENOL  #3) 300-30 MG tablet Take 1 tablet by mouth every 12 (twelve) hours as needed for moderate pain. 60 tablet 0   Blood Glucose Monitoring Suppl (ACCU-CHEK GUIDE) w/Device KIT Use as instructed to check blood sugar three times daily. 1 kit 0   cetirizine  (ZYRTEC  ALLERGY) 10 MG tablet Take 1 tablet (10 mg total) by mouth daily as needed for allergies. 90 tablet 3   denosumab  (PROLIA ) 60 MG/ML SOSY injection Inject 60 mg into the skin every 6 (six) months.     diclofenac  Sodium (VOLTAREN ) 1 % GEL Apply 2 g topically 4 (four) times daily. 100 g 2   etanercept (ENBREL SURECLICK) 50 MG/ML injection      glucose blood (ACCU-CHEK GUIDE) test strip Use as instructed to check blood sugar three times daily. E11.9 100 each 2   hydroxychloroquine (  PLAQUENIL) 200 MG tablet Take 200 mg by mouth daily.     Insulin  Pen Needle (BD PEN NEEDLE NANO U/F) 32G X 4 MM MISC Use as directed with insulin  pen 100 each 1   No current facility-administered medications on file prior to visit.    No Known Allergies  Social History   Socioeconomic History   Marital status: Married    Spouse name: Not on file   Number of children: Not on file   Years of  education: Not on file   Highest education level: Not on file  Occupational History   Not on file  Tobacco Use   Smoking status: Never   Smokeless tobacco: Never  Vaping Use   Vaping status: Never Used  Substance and Sexual Activity   Alcohol use: No   Drug use: No   Sexual activity: Not Currently  Other Topics Concern   Not on file  Social History Narrative   Not on file   Social Drivers of Health   Financial Resource Strain: Medium Risk (09/05/2023)   Overall Financial Resource Strain (CARDIA)    Difficulty of Paying Living Expenses: Somewhat hard  Food Insecurity: Food Insecurity Present (09/05/2023)   Hunger Vital Sign    Worried About Running Out of Food in the Last Year: Sometimes true    Ran Out of Food in the Last Year: Sometimes true  Transportation Needs: No Transportation Needs (09/05/2023)   PRAPARE - Administrator, Civil Service (Medical): No    Lack of Transportation (Non-Medical): No  Physical Activity: Inactive (09/05/2023)   Exercise Vital Sign    Days of Exercise per Week: 0 days    Minutes of Exercise per Session: 0 min  Stress: Stress Concern Present (09/05/2023)   [employer] of Occupational Health - Occupational Stress Questionnaire    Feeling of Stress : Rather much  Social Connections: Socially Integrated (09/05/2023)   Social Connection and Isolation Panel    Frequency of Communication with Friends and Family: More than three times a week    Frequency of Social Gatherings with Friends and Family: Three times a week    Attends Religious Services: More than 4 times per year    Active Member of Clubs or Organizations: Yes    Attends Banker Meetings: More than 4 times per year    Marital Status: Married  Catering manager Violence: Not At Risk (09/05/2023)   Humiliation, Afraid, Rape, and Kick questionnaire    Fear of Current or Ex-Partner: No    Emotionally Abused: No    Physically Abused: No    Sexually Abused: No     Family History  Problem Relation Age of Onset   Diabetes Mother    Asthma Father    Colon cancer Neg Hx    Stomach cancer Neg Hx    Esophageal cancer Neg Hx    Pancreatic cancer Neg Hx    Liver disease Neg Hx     Past Surgical History:  Procedure Laterality Date   CHOLECYSTECTOMY     approx 6 yrs ago in [place]    COLONOSCOPY     approx 15 years ago in [place]    ROS: Review of Systems Negative except as stated above  PHYSICAL EXAM: BP 119/71 (BP Location: Left Arm, Patient Position: Sitting, Cuff Size: Normal)   Pulse 71   Temp 98.2 F (36.8 C) (Oral)   Ht 5' 2 (1.575 m)   Wt 160 lb (72.6 kg)   SpO2  97%   BMI 29.26 kg/m   Physical Exam  General appearance - alert, well appearing, and in no distress Mental status - normal mood, behavior, speech, dress, motor activity, and thought processes Chest - clear to auscultation, no wheezes, rales or rhonchi, symmetric air entry Heart - normal rate, regular rhythm, normal S1, S2, no murmurs, rubs, clicks or gallops Abdomen - soft, nontender, nondistended, no masses or organomegaly Extremities - peripheral pulses normal, no pedal edema, no clubbing or cyanosis  Results for orders placed or performed in visit on 05/27/24  POCT glycosylated hemoglobin (Hb A1C)   Collection Time: 05/27/24 10:32 AM  Result Value Ref Range   Hemoglobin A1C     HbA1c POC (<> result, manual entry)     HbA1c, POC (prediabetic range)     HbA1c, POC (controlled diabetic range) 12.0 (A) 0.0 - 7.0 %  POCT glucose (manual entry)   Collection Time: 05/27/24 10:32 AM  Result Value Ref Range   POC Glucose 295 (A) 70 - 99 mg/dl  POCT URINALYSIS DIP (CLINITEK)   Collection Time: 05/27/24 11:17 AM  Result Value Ref Range   Color, UA yellow yellow   Clarity, UA clear clear   Glucose, UA >=1,000 (A) negative mg/dL   Bilirubin, UA negative negative   Ketones, POC UA trace (5) (A) negative mg/dL   Spec Grav, UA 8.984 8.989 - 1.025   Blood, UA small (A)  negative   pH, UA 5.5 5.0 - 8.0   POC PROTEIN,UA negative negative, trace   Urobilinogen, UA 0.2 0.2 or 1.0 E.U./dL   Nitrite, UA Positive (A) Negative   Leukocytes, UA Negative Negative       Latest Ref Rng & Units 09/08/2023   10:01 AM 05/04/2023    4:42 PM 04/06/2022   11:02 AM  CMP  Glucose 70 - 99 mg/dL  820  850   BUN 8 - 27 mg/dL  10  13   Creatinine 9.42 - 1.00 mg/dL  9.36  9.32   Sodium 865 - 144 mmol/L  138  140   Potassium 3.5 - 5.2 mmol/L  3.9  4.2   Chloride 96 - 106 mmol/L  102  105   CO2 20 - 29 mmol/L  21  23   Calcium  8.7 - 10.3 mg/dL  9.8  9.3   Total Protein 6.0 - 8.5 g/dL 7.4  8.0  7.9   Total Bilirubin 0.0 - 1.2 mg/dL 0.2  0.2  0.2   Alkaline Phos 44 - 121 IU/L 80  139  136   AST 0 - 40 IU/L 19  58  44   ALT 0 - 32 IU/L 19  72  54    Lipid Panel     Component Value Date/Time   CHOL 187 05/04/2023 1642   TRIG 172 (H) 05/04/2023 1642   HDL 47 05/04/2023 1642   CHOLHDL 4.0 05/04/2023 1642   CHOLHDL 4.6 12/13/2016 1149   VLDL 41 (H) 12/13/2016 1149   LDLCALC 110 (H) 05/04/2023 1642    CBC    Component Value Date/Time   WBC 5.7 10/03/2022 0950   WBC 8.1 10/05/2020 1615   RBC 4.29 10/03/2022 0950   RBC 4.05 10/05/2020 1615   HGB 12.3 10/03/2022 0950   HCT 37.5 10/03/2022 0950   PLT 323 10/03/2022 0950   MCV 87 10/03/2022 0950   MCH 28.7 10/03/2022 0950   MCH 28.8 12/13/2016 1149   MCHC 32.8 10/03/2022 0950   MCHC 33.1 10/05/2020  1615   RDW 13.9 10/03/2022 0950   LYMPHSABS 2.3 10/05/2020 1615   MONOABS 0.4 10/05/2020 1615   EOSABS 0.2 10/05/2020 1615   BASOSABS 0.1 10/05/2020 1615    ASSESSMENT AND PLAN: 1. Type 2 diabetes mellitus with hyperglycemia, with long-term current use of insulin  (HCC) (Primary) Not at goal. Encouraged her to eliminate sugary drinks from the diet. Continue Janumet  50/1001 tablet twice a day.  Increase insulin  from 8 units daily to 12 units daily. Discussed trying to get her a continuous glucose monitor.  She does  have an iPhone so would be able to use that as her reader.  Advised to have the pharmacist show her how to apply the sensor and download the app on her phone. - POCT glycosylated hemoglobin (Hb A1C) - POCT glucose (manual entry) - sitaGLIPtin -metformin  (JANUMET ) 50-1000 MG tablet; Take 1 tablet by mouth 2 (two) times daily with a meal.  Dispense: 180 tablet; Refill: 1 - atorvastatin  (LIPITOR) 20 MG tablet; Take 1 tablet (20 mg total) by mouth daily.  Dispense: 90 tablet; Refill: 1 - Continuous Glucose Sensor (FREESTYLE LIBRE 3 PLUS SENSOR) MISC; Change sensor every 15 days.  Dispense: 2 each; Refill: 11 - Microalbumin / creatinine urine ratio - Ambulatory referral to Ophthalmology  2. Diabetes mellitus treated with oral medication (HCC) See #1 above  3. Hyperlipidemia associated with type 2 diabetes mellitus (HCC) Continue atorvastatin   4. Dysuria Patient with nitrates in the urine suggesting UTI.  Will treat with Cipro  for 7 days. - POCT URINALYSIS DIP (CLINITEK)  5. Vaginal itching Will treat empirically for yeast with Diflucan . - Cervicovaginal ancillary only - fluconazole  (DIFLUCAN ) 150 MG tablet; Take 1 tablet (150 mg total) by mouth daily.  Dispense: 1 tablet; Refill: 0  6. Dyspareunia in female If this does not resolve with treatment of yeast and UTI, patient advised to follow-up  7. Rheumatoid arthritis involving multiple sites with positive rheumatoid factor (HCC) Doing well on Enbrel and Plaquenil.  Followed by rheumatology.  8. Encounter for screening mammogram for malignant neoplasm of breast - MM 3D SCREENING MAMMOGRAM BILATERAL BREAST; Future    Patient was given the opportunity to ask questions.  Patient verbalized understanding of the plan and was able to repeat key elements of the plan.   This documentation was completed using Paediatric nurse.  Any transcriptional errors are unintentional.  Orders Placed This Encounter  Procedures   MM 3D  SCREENING MAMMOGRAM BILATERAL BREAST   Microalbumin / creatinine urine ratio   Ambulatory referral to Ophthalmology   POCT glycosylated hemoglobin (Hb A1C)   POCT glucose (manual entry)   POCT URINALYSIS DIP (CLINITEK)     Requested Prescriptions   Signed Prescriptions Disp Refills   sitaGLIPtin -metformin  (JANUMET ) 50-1000 MG tablet 180 tablet 1    Sig: Take 1 tablet by mouth 2 (two) times daily with a meal.   atorvastatin  (LIPITOR) 20 MG tablet 90 tablet 1    Sig: Take 1 tablet (20 mg total) by mouth daily.   fluconazole  (DIFLUCAN ) 150 MG tablet 1 tablet 0    Sig: Take 1 tablet (150 mg total) by mouth daily.   Continuous Glucose Sensor (FREESTYLE LIBRE 3 PLUS SENSOR) MISC 2 each 11    Sig: Change sensor every 15 days.   ciprofloxacin  (CIPRO ) 500 MG tablet 14 tablet 0    Sig: Take 1 tablet (500 mg total) by mouth 2 (two) times daily for 7 days.    Return in about 3 months (around 08/27/2024)  for 3 weeks with clinical pharmacist for DM.  Barnie Louder, MD, FACP

## 2024-05-27 NOTE — Patient Instructions (Signed)
 Increase insulin  to 12 units daily. Stop drinking sodas. Prescription sent for continuous glucose monitor to the pharmacy.   Alimentacin saludable en los adultos Healthy Eating, Adult Una alimentacin saludable puede ayudarlo a Barista y Pharmacologist un peso saludable, reducir el riesgo de tener enfermedades crnicas y vivir una vida larga y productiva. Es importante que siga una modalidad de alimentacin saludable. Sus necesidades nutricionales y calricas deben satisfacerse principalmente con distintos alimentos ricos en nutrientes. Consejos para seguir Surveyor, minerals Lea las etiquetas de los alimentos Lea las etiquetas y elija las que digan lo siguiente: Productos reducidos en sodio o con bajo contenido de sodio. Jugos con 100 % jugo de fruta. Alimentos con bajo contenido de grasas saturadas (menos de 3 g por porcin) y alto contenido de grasas poliinsaturadas y Mining engineer. Alimentos con cereales integrales, como trigo integral, trigo partido, arroz integral y arroz salvaje. Cereales integrales fortificados con cido flico. Esto se recomienda a las mujeres embarazadas o que desean quedar embarazadas. Lea las etiquetas y no coma ni beba lo siguiente: Alimentos o bebidas con azcar agregada. Estos incluyen los alimentos que contienen azcar moreno, endulzante a base de maz, jarabe de maz, dextrosa, fructosa, glucosa, jarabe de maz de alta fructosa, miel, azcar invertido, lactosa, jarabe de malta, maltosa, melaza, azcar sin refinar, sacarosa, trehalosa y azcar turbinado. Limite el consumo de azcar agregada a menos del 10 % del total de caloras diarias. No consuma ms que las siguientes cantidades de azcar agregada por da: 6 cucharaditas (25 g) para las mujeres. 9 cucharaditas (38 g) para los hombres. Los alimentos que contienen almidones y cereales refinados o procesados. Los productos de cereales refinados, como harina blanca, harina de maz desgerminada, pan blanco y arroz  blanco. Al ir de compras Elija refrigerios ricos en nutrientes, como verduras, frutas enteras y frutos secos. Evite los refrigerios con alto contenido de caloras y International aid/development worker, como las papas fritas, los refrigerios frutales y los caramelos. Use alios y productos para untar a base de aceite con los Publishing rights manager de grasas slidas como la [place], la [place], la crema agria o el queso crema. Limite las salsas, las mezclas y los productos "instantneos" preelaborados como el arroz saborizado, los fideos instantneos y las pastas listas para comer. Pruebe ms fuentes de protena vegetal, como tofu, tempeh, frijoles negros, edamame, lentejas, frutos secos y semillas. Explore planes de alimentacin como la dieta mediterrnea o la dieta vegetariana. Pruebe salsas cardiosaludables hechas con frijoles y grasas saludables, como hummus y guacamole. Las verduras van muy bien con ellas. Al cocinar Use aceite para Designer, multimedia de grasas slidas como [place], margarina o [place] de [place]. En lugar de frer, trate de cocinar en el horno, en la plancha o en la parrilla, o hervir los alimentos. Retire la parte grasa de las carnes antes de cocinarlas. Cocine las verduras al vapor en agua o caldo. Planificacin de las comidas  En las comidas, imagine dividir su plato en cuartos: La mitad del plato tiene frutas y verduras. Un cuarto del plato tiene cereales integrales. Un cuarto del plato tiene protena, especialmente carnes [place], aves, huevos, tofu, frijoles o frutos secos. Incluya lcteos descremados en su dieta diaria. Estilo de vida Elija opciones saludables en todos los mbitos, como en el hogar, el [place], la [place], los restaurantes y [place]. Prepare los alimentos de un modo seguro: Lvese las manos despus de manipular carnes crudas. Donde prepare alimentos, mantenga las superficies limpias lavndolas regularmente con agua caliente y [place]. Mantenga las  carnes  crudas separadas de los alimentos que estn listos para comer como las frutas y las verduras. Cocine los frutos de mar, carnes, aves y huevos hasta alcanzar la temperatura recomendada. Consiga un termmetro para alimentos. Almacene los alimentos a temperaturas seguras. En general: Mantenga los alimentos fros a una temperatura de 40 F (4,4 C) o inferior. Mantenga los alimentos calientes a una temperatura de 140 F (60 C) o superior. Mantenga el congelador a una temperatura de 0 F (-17,8 C) o inferior. Los alimentos no son seguros para su consumo cuando han estado a una temperatura de entre 40 y 140 F (4.4 y 60 C) por ms de 2 horas. Qu alimentos debo comer? Frutas Propngase comer entre 1 y 2 tazas de frutas frescas, Primary school teacher (en su jugo natural) o Primary school teacher. Una taza de fruta equivale a 1 manzana pequea, 1 banana grande, 8 fresas grandes, 1 taza (237 g) de fruta enlatada,  taza (82 g) de fruta seca o 1 taza (240 ml) de jugo al 100 %. Verduras Propngase comer de 2 a 4 tazas de verduras frescas y congeladas cada da, incluyendo diferentes variedades y colores. Una taza de verduras equivale a 1 taza (91 g) de brcoli o coliflor, 2 zanahorias medianas, 2 tazas (150 g) de verduras de [employer] crudas, 1 tomate grande, 1 pimiento morrn grande, 1 batata grande o 1 patata blanca mediana. Cereales Propngase comer el equivalente a entre 4 y 10 onzas de cereales integrales por Futures trader. Algunos ejemplos de equivalentes a 1 onza de cereales son 1 rebanada de pan, 1 taza (40 g) de cereal listo para comer, 3 tazas (24 g) de palomitas de maz o  taza (93 g) de arroz cocido. Carnes y otras protenas Propngase comer el equivalente a entre 5 y 7  onzas de protena por Futures trader. Algunos ejemplos de equivalentes a 1 onza de protenas incluyen 1 huevo,  oz de frutos secos (12 almendras, 24 pistachos o 7 mitades de nueces), 1/4 taza (90 g) de frijoles cocidos, 6 cucharadas (90 g) de hummus o 1  cucharada (16 g) de [place] de man. Un corte de carne o pescado del tamao de un mazo de cartas equivale aproximadamente a 3 a 4 onzas (85 g). De las protenas que consume cada semana, intente que al menos 8 onzas (227 g) sean frutos de mar. Esto equivale a unas 2 porciones por semana. Esto incluye salmn, trucha, arenque y anchoas. Lcteos [employer] a 3 tazas de lcteos descremados o con bajo contenido de grasa cada da. Algunos ejemplos de equivalentes a 1 taza de lcteos son 1 taza (240 ml) de leche, 8 onzas (250 g) de yogur, 1 onzas (44 g) de queso natural o 1 taza (240 ml) de leche de soja fortificada. Grasas y aceites Propngase consumir alrededor de 5 cucharaditas (21 g) de grasas y Acupuncturist. Elija grasas monoinsaturadas, como el aceite de [place] y de oliva, la [place] con aceite de [place] o de [place], [place], [place] de man y Games developer de los frutos secos, o bien grasas poliinsaturadas, como el aceite de [place], maz y soja, nueces, piones, semillas de ssamo, semillas de girasol y semillas de lino. Bebidas Propngase beber 6 vasos de 8 onzas de Warehouse manager. Limite el caf a entre 3 y 5 tazas de ocho onzas por Futures trader. Limite el consumo de bebidas con cafena que tengan caloras agregadas, como los refrescos y las bebidas energizantes. Si bebe alcohol: Limite la cantidad  que bebe a lo siguiente: De 0 a 1 medida al da si es [place]. De 0 a 2 medidas al da si es varn. Sepa cunta cantidad de alcohol hay en las bebidas que toma. En los [address], una medida es una botella de cerveza de 12 oz (355 ml), un vaso de vino de 5 oz (148 ml) o un vaso de una bebida alcohlica de alta graduacin de 1 oz (44 ml). Condimentos y otros alimentos Trate de no agregar demasiada sal a los alimentos. Trate de usar hierbas y especias en lugar de sal. Trate de no agregar azcar a los alimentos. Esta informacin se basa en las pautas de nutricin de los [place] Para obtener ms informacin, visite [email or web address]. Las Information systems manager. Es posible que necesite cantidades diferentes. Esta informacin no tiene Theme park manager el consejo del mdico. Asegrese de hacerle al mdico cualquier pregunta que tenga. Document Revised: 09/13/2022 Document Reviewed: 09/13/2022 Elsevier Patient Education  2024 [employer].

## 2024-05-28 LAB — CERVICOVAGINAL ANCILLARY ONLY
Bacterial Vaginitis (gardnerella): POSITIVE — AB
Candida Glabrata: NEGATIVE
Candida Vaginitis: POSITIVE — AB
Chlamydia: NEGATIVE
Comment: NEGATIVE
Comment: NEGATIVE
Comment: NEGATIVE
Comment: NEGATIVE
Comment: NEGATIVE
Comment: NORMAL
Neisseria Gonorrhea: NEGATIVE
Trichomonas: NEGATIVE

## 2024-05-29 ENCOUNTER — Ambulatory Visit: Payer: Self-pay | Admitting: Internal Medicine

## 2024-05-29 ENCOUNTER — Telehealth: Payer: Self-pay | Admitting: Internal Medicine

## 2024-05-29 ENCOUNTER — Other Ambulatory Visit: Payer: Self-pay

## 2024-05-29 DIAGNOSIS — N898 Other specified noninflammatory disorders of vagina: Secondary | ICD-10-CM

## 2024-05-29 LAB — MICROALBUMIN / CREATININE URINE RATIO
Creatinine, Urine: 112.4 mg/dL
Microalb/Creat Ratio: 7 mg/g{creat} (ref 0–29)
Microalbumin, Urine: 7.5 ug/mL

## 2024-05-29 MED ORDER — METRONIDAZOLE 500 MG PO TABS
500.0000 mg | ORAL_TABLET | Freq: Two times a day (BID) | ORAL | 0 refills | Status: DC
  Filled 2024-05-29: qty 14, 7d supply, fill #0

## 2024-05-29 MED ORDER — FLUCONAZOLE 150 MG PO TABS
150.0000 mg | ORAL_TABLET | Freq: Every day | ORAL | 0 refills | Status: AC
Start: 2024-05-29 — End: ?
  Filled 2024-05-29: qty 1, 1d supply, fill #0

## 2024-05-29 NOTE — Telephone Encounter (Signed)
-----   Message from [facility] Clarisa A sent at 05/29/2024  2:51 PM EDT ----- Regarding: RE: Had Shingrix  #2 at [facility] on [address] and they said that they have no record of a vaccine with them. Additionally I looked the state vax record and all I could see is her first shingles vax but no record of the second one. Would you like for me to set up a nurse visit for her to get her 2nd shingles? ----- Message ----- From: Vicci Barnie NOVAK, MD Sent: 05/27/2024   1:12 PM EDT To: Brent LOISE Crocker, CMA Subject: Had Shingrix  #2 at [facility]     Please call to get info and update HM.

## 2024-06-05 ENCOUNTER — Other Ambulatory Visit: Payer: Self-pay

## 2024-06-14 ENCOUNTER — Encounter: Payer: Self-pay | Admitting: Advanced Practice Midwife

## 2024-06-18 ENCOUNTER — Ambulatory Visit: Payer: Medicare Other

## 2024-06-20 ENCOUNTER — Telehealth: Payer: Self-pay | Admitting: Internal Medicine

## 2024-06-20 NOTE — Telephone Encounter (Signed)
 Called pt to confirm appt LVM

## 2024-06-21 ENCOUNTER — Ambulatory Visit: Admitting: Pharmacist

## 2024-07-19 ENCOUNTER — Ambulatory Visit

## 2024-07-26 DIAGNOSIS — M810 Age-related osteoporosis without current pathological fracture: Secondary | ICD-10-CM | POA: Diagnosis not present

## 2024-07-30 DIAGNOSIS — M25521 Pain in right elbow: Secondary | ICD-10-CM | POA: Diagnosis not present

## 2024-07-30 DIAGNOSIS — M1990 Unspecified osteoarthritis, unspecified site: Secondary | ICD-10-CM | POA: Diagnosis not present

## 2024-07-30 DIAGNOSIS — M069 Rheumatoid arthritis, unspecified: Secondary | ICD-10-CM | POA: Diagnosis not present

## 2024-07-30 DIAGNOSIS — Z79899 Other long term (current) drug therapy: Secondary | ICD-10-CM | POA: Diagnosis not present

## 2024-07-30 DIAGNOSIS — M810 Age-related osteoporosis without current pathological fracture: Secondary | ICD-10-CM | POA: Diagnosis not present

## 2024-07-30 DIAGNOSIS — M79641 Pain in right hand: Secondary | ICD-10-CM | POA: Diagnosis not present

## 2024-07-30 DIAGNOSIS — M79642 Pain in left hand: Secondary | ICD-10-CM | POA: Diagnosis not present

## 2024-08-28 ENCOUNTER — Telehealth: Payer: Self-pay | Admitting: Internal Medicine

## 2024-08-28 NOTE — Telephone Encounter (Signed)
 Called pt, pt will be present at appt.

## 2024-09-02 ENCOUNTER — Other Ambulatory Visit: Payer: Self-pay | Admitting: Internal Medicine

## 2024-09-02 ENCOUNTER — Other Ambulatory Visit: Payer: Self-pay

## 2024-09-02 ENCOUNTER — Ambulatory Visit: Payer: Self-pay | Attending: Internal Medicine | Admitting: Internal Medicine

## 2024-09-02 ENCOUNTER — Encounter: Payer: Self-pay | Admitting: Internal Medicine

## 2024-09-02 VITALS — BP 137/69 | HR 71 | Temp 98.2°F | Ht 62.0 in | Wt 164.0 lb

## 2024-09-02 DIAGNOSIS — E119 Type 2 diabetes mellitus without complications: Secondary | ICD-10-CM

## 2024-09-02 DIAGNOSIS — Z1231 Encounter for screening mammogram for malignant neoplasm of breast: Secondary | ICD-10-CM | POA: Insufficient documentation

## 2024-09-02 DIAGNOSIS — E785 Hyperlipidemia, unspecified: Secondary | ICD-10-CM

## 2024-09-02 DIAGNOSIS — E7849 Other hyperlipidemia: Secondary | ICD-10-CM | POA: Diagnosis not present

## 2024-09-02 DIAGNOSIS — M0579 Rheumatoid arthritis with rheumatoid factor of multiple sites without organ or systems involvement: Secondary | ICD-10-CM | POA: Diagnosis not present

## 2024-09-02 DIAGNOSIS — Z7984 Long term (current) use of oral hypoglycemic drugs: Secondary | ICD-10-CM | POA: Diagnosis not present

## 2024-09-02 DIAGNOSIS — E1169 Type 2 diabetes mellitus with other specified complication: Secondary | ICD-10-CM

## 2024-09-02 DIAGNOSIS — Z23 Encounter for immunization: Secondary | ICD-10-CM | POA: Diagnosis not present

## 2024-09-02 DIAGNOSIS — Z794 Long term (current) use of insulin: Secondary | ICD-10-CM | POA: Diagnosis not present

## 2024-09-02 DIAGNOSIS — R030 Elevated blood-pressure reading, without diagnosis of hypertension: Secondary | ICD-10-CM | POA: Diagnosis not present

## 2024-09-02 DIAGNOSIS — Z79899 Other long term (current) drug therapy: Secondary | ICD-10-CM | POA: Diagnosis not present

## 2024-09-02 DIAGNOSIS — M810 Age-related osteoporosis without current pathological fracture: Secondary | ICD-10-CM | POA: Insufficient documentation

## 2024-09-02 DIAGNOSIS — Z2821 Immunization not carried out because of patient refusal: Secondary | ICD-10-CM | POA: Diagnosis not present

## 2024-09-02 LAB — POCT GLYCOSYLATED HEMOGLOBIN (HGB A1C): HbA1c, POC (controlled diabetic range): 7.8 % — AB (ref 0.0–7.0)

## 2024-09-02 LAB — GLUCOSE, POCT (MANUAL RESULT ENTRY): POC Glucose: 169 mg/dL — AB (ref 70–99)

## 2024-09-02 MED ORDER — BD PEN NEEDLE NANO U/F 32G X 4 MM MISC
1 refills | Status: AC
  Filled 2024-09-02: qty 100, 90d supply, fill #0

## 2024-09-02 MED ORDER — ZOSTER VAC RECOMB ADJUVANTED 50 MCG/0.5ML IM SUSR
0.5000 mL | Freq: Once | INTRAMUSCULAR | 0 refills | Status: AC
Start: 2024-09-02 — End: 2024-09-03
  Filled 2024-09-02: qty 1, 1d supply, fill #0

## 2024-09-02 MED ORDER — ACCU-CHEK SOFTCLIX LANCETS MISC
12 refills | Status: AC
  Filled 2024-09-02: qty 100, fill #0

## 2024-09-02 MED ORDER — FREESTYLE LIBRE 3 PLUS SENSOR MISC
11 refills | Status: AC
Start: 2024-09-02 — End: ?
  Filled 2024-09-02: qty 2, 30d supply, fill #0

## 2024-09-02 MED ORDER — ACCU-CHEK GUIDE TEST VI STRP
ORAL_STRIP | 12 refills | Status: AC
  Filled 2024-09-02: qty 100, 50d supply, fill #0

## 2024-09-02 MED ORDER — INSULIN GLARGINE-YFGN 100 UNIT/ML ~~LOC~~ SOPN
13.0000 [IU] | PEN_INJECTOR | Freq: Every day | SUBCUTANEOUS | 0 refills | Status: AC
  Filled 2024-09-02: qty 3, 23d supply, fill #0

## 2024-09-02 MED ORDER — ACCU-CHEK GUIDE W/DEVICE KIT
PACK | 0 refills | Status: AC
  Filled 2024-09-02: qty 1, 30d supply, fill #0

## 2024-09-02 MED ORDER — ATORVASTATIN CALCIUM 20 MG PO TABS
20.0000 mg | ORAL_TABLET | Freq: Every day | ORAL | 1 refills | Status: AC
  Filled 2024-09-02 (×2): qty 90, 90d supply, fill #0

## 2024-09-02 NOTE — Progress Notes (Signed)
 Patient ID: Natalie Cherry, female    DOB: 08-Nov-1961  MRN: [id number]  CC: Diabetes (DM f/u. Med refills. /No questions / concerns/No to flu vax. )   Subjective: Natalie Cherry is a 63 y.o. female who presents for chronic ds management. Her concerns today include:  Pt with hx of DM, HL, RA, osteoporosis on Prolia , acute pancreatitis   [employer] Language interpreter used during this encounter. #Isabel [id number]  Discussed the use of AI scribe software for clinical note transcription with the patient, who gave verbal consent to proceed.  History of Present Illness   Natalie Cherry is a 63 year old female with diabetes and hyperlipidemia who presents for follow-up.  DM: Results for orders placed or performed in visit on 09/02/24  POCT glucose (manual entry)   Collection Time: 09/02/24  9:51 AM  Result Value Ref Range   POC Glucose 169 (A) 70 - 99 mg/dl  POCT glycosylated hemoglobin (Hb A1C)   Collection Time: 09/02/24  9:52 AM  Result Value Ref Range   Hemoglobin A1C     HbA1c POC (<> result, manual entry)     HbA1c, POC (prediabetic range)     HbA1c, POC (controlled diabetic range) 7.8 (A) 0.0 - 7.0 %  She has been managing her diabetes with Janumet  50/1000 mg twice daily and Lantus  insulin  12 units daily. Her hemoglobin A1c was 12% in June and is 7.8% currently. She has not been checking her blood sugars for the past two months due to a broken glucometer but has been trying to eat healthily, avoiding sodas and rice. She had not eaten breakfast or had coffee this morning.  HL: For her hyperlipidemia, she continues to take atorvastatin  20 mg daily. Last LDL 110  June 2024.  She has a history of rheumatoid arthritis and continues to see her rheumatologist. She is on Enbrel and Plaquenil and feels ds under control with these meds.  BP elev today; normal on past visits. She mentioned having consumed three cups of coffee the previous day.   HM:she has not yet received her second shingles vaccine.  The pharmacy did not have a record of her receiving it. She is agreeable to me sending a rxn to her pharmacy for it. She has an upcoming diabetic eye exam scheduled for October 8th at 10 AM with [facility].  Never received call for MMG     Patient Active Problem List   Diagnosis Date Noted   History of acute pancreatitis 10/03/2022   Hyperlipidemia associated with type 2 diabetes mellitus (HCC) 07/24/2020   Over weight 07/24/2020   Influenza vaccination declined 07/24/2020   Osteoporosis without current pathological fracture 12/07/2018   Type 2 diabetes mellitus with hyperglycemia, without long-term current use of insulin  (HCC) 11/06/2015   Rheumatoid arthritis (HCC) 01/29/2014   Vitamin D  deficiency 01/29/2014   Dental caries 03/02/2009   Osteoarthritis of spine 10/05/2007     Current Outpatient Medications on File Prior to Visit  Medication Sig Dispense Refill   acetaminophen -codeine  (TYLENOL  #3) 300-30 MG tablet Take 1 tablet by mouth every 12 (twelve) hours as needed for moderate pain. 60 tablet 0   cetirizine  (ZYRTEC  ALLERGY) 10 MG tablet Take 1 tablet (10 mg total) by mouth daily as needed for allergies. 90 tablet 3   denosumab  (PROLIA ) 60 MG/ML SOSY injection Inject 60 mg into the skin every 6 (six) months.     diclofenac  Sodium (VOLTAREN ) 1 % GEL Apply 2 g topically 4 (four) times daily.  100 g 2   etanercept (ENBREL SURECLICK) 50 MG/ML injection      fluconazole  (DIFLUCAN ) 150 MG tablet Take 1 tablet (150 mg total) by mouth daily. 1 tablet 0   hydroxychloroquine (PLAQUENIL) 200 MG tablet Take 200 mg by mouth daily.     sitaGLIPtin -metformin  (JANUMET ) 50-1000 MG tablet Take 1 tablet by mouth 2 (two) times daily with a meal. 180 tablet 1   No current facility-administered medications on file prior to visit.    No Known Allergies  Social History   Socioeconomic History   Marital status: Married    Spouse name: Not on file   Number of children: Not on file   Years of  education: Not on file   Highest education level: Not on file  Occupational History   Not on file  Tobacco Use   Smoking status: Never   Smokeless tobacco: Never  Vaping Use   Vaping status: Never Used  Substance and Sexual Activity   Alcohol use: No   Drug use: No   Sexual activity: Not Currently  Other Topics Concern   Not on file  Social History Narrative   Not on file   Social Drivers of Health   Financial Resource Strain: Medium Risk (09/05/2023)   Overall Financial Resource Strain (CARDIA)    Difficulty of Paying Living Expenses: Somewhat hard  Food Insecurity: Food Insecurity Present (09/05/2023)   Hunger Vital Sign    Worried About Running Out of Food in the Last Year: Sometimes true    Ran Out of Food in the Last Year: Sometimes true  Transportation Needs: No Transportation Needs (09/05/2023)   PRAPARE - Administrator, Civil Service (Medical): No    Lack of Transportation (Non-Medical): No  Physical Activity: Inactive (09/05/2023)   Exercise Vital Sign    Days of Exercise per Week: 0 days    Minutes of Exercise per Session: 0 min  Stress: Stress Concern Present (09/05/2023)   [employer] of Occupational Health - Occupational Stress Questionnaire    Feeling of Stress : Rather much  Social Connections: Socially Integrated (09/05/2023)   Social Connection and Isolation Panel    Frequency of Communication with Friends and Family: More than three times a week    Frequency of Social Gatherings with Friends and Family: Three times a week    Attends Religious Services: More than 4 times per year    Active Member of Clubs or Organizations: Yes    Attends Banker Meetings: More than 4 times per year    Marital Status: Married  Catering manager Violence: Not At Risk (09/05/2023)   Humiliation, Afraid, Rape, and Kick questionnaire    Fear of Current or Ex-Partner: No    Emotionally Abused: No    Physically Abused: No    Sexually Abused: No     Family History  Problem Relation Age of Onset   Diabetes Mother    Asthma Father    Colon cancer Neg Hx    Stomach cancer Neg Hx    Esophageal cancer Neg Hx    Pancreatic cancer Neg Hx    Liver disease Neg Hx     Past Surgical History:  Procedure Laterality Date   CHOLECYSTECTOMY     approx 6 yrs ago in [place]    COLONOSCOPY     approx 15 years ago in [place]    ROS: Review of Systems Negative except as stated above  PHYSICAL EXAM: BP 137/69   Pulse 71  Temp 98.2 F (36.8 C) (Oral)   Ht 5' 2 (1.575 m)   Wt 164 lb (74.4 kg)   SpO2 97%   BMI 30.00 kg/m   Physical Exam  General appearance - alert, well appearing, and in no distress Mental status - normal mood, behavior, speech, dress, motor activity, and thought processes Neck - supple, no significant adenopathy Chest - clear to auscultation, no wheezes, rales or rhonchi, symmetric air entry Heart - normal rate, regular rhythm, normal S1, S2, no murmurs, rubs, clicks or gallops Extremities - peripheral pulses normal, no pedal edema, no clubbing or cyanosis      Latest Ref Rng & Units 09/08/2023   10:01 AM 05/04/2023    4:42 PM 04/06/2022   11:02 AM  CMP  Glucose 70 - 99 mg/dL  820  850   BUN 8 - 27 mg/dL  10  13   Creatinine 9.42 - 1.00 mg/dL  9.36  9.32   Sodium 865 - 144 mmol/L  138  140   Potassium 3.5 - 5.2 mmol/L  3.9  4.2   Chloride 96 - 106 mmol/L  102  105   CO2 20 - 29 mmol/L  21  23   Calcium  8.7 - 10.3 mg/dL  9.8  9.3   Total Protein 6.0 - 8.5 g/dL 7.4  8.0  7.9   Total Bilirubin 0.0 - 1.2 mg/dL 0.2  0.2  0.2   Alkaline Phos 44 - 121 IU/L 80  139  136   AST 0 - 40 IU/L 19  58  44   ALT 0 - 32 IU/L 19  72  54    Lipid Panel     Component Value Date/Time   CHOL 187 05/04/2023 1642   TRIG 172 (H) 05/04/2023 1642   HDL 47 05/04/2023 1642   CHOLHDL 4.0 05/04/2023 1642   CHOLHDL 4.6 12/13/2016 1149   VLDL 41 (H) 12/13/2016 1149   LDLCALC 110 (H) 05/04/2023 1642    CBC    Component Value  Date/Time   WBC 5.7 10/03/2022 0950   WBC 8.1 10/05/2020 1615   RBC 4.29 10/03/2022 0950   RBC 4.05 10/05/2020 1615   HGB 12.3 10/03/2022 0950   HCT 37.5 10/03/2022 0950   PLT 323 10/03/2022 0950   MCV 87 10/03/2022 0950   MCH 28.7 10/03/2022 0950   MCH 28.8 12/13/2016 1149   MCHC 32.8 10/03/2022 0950   MCHC 33.1 10/05/2020 1615   RDW 13.9 10/03/2022 0950   LYMPHSABS 2.3 10/05/2020 1615   MONOABS 0.4 10/05/2020 1615   EOSABS 0.2 10/05/2020 1615   BASOSABS 0.1 10/05/2020 1615    ASSESSMENT AND PLAN: 1. Type 2 diabetes mellitus with other specified complication, with long-term current use of insulin  (HCC) (Primary) A1c not at goal but significantly improved since last visit.  We have no home blood sugar readings to help guide medication adjustment.  However her fasting blood sugar today is 169.  I recommend that we increase Lantus  insulin  to 13 units daily.  Continue Janumet  50/1000 mg twice a day. -Continue to work on eating habits. Keep upcoming appointment with ophthalmology later this month. - POCT glucose (manual entry) - POCT glycosylated hemoglobin (Hb A1C) - atorvastatin  (LIPITOR) 20 MG tablet; Take 1 tablet (20 mg total) by mouth daily.  Dispense: 90 tablet; Refill: 1 - Continuous Glucose Sensor (FREESTYLE LIBRE 3 PLUS SENSOR) MISC; Change sensor every 15 days.  Dispense: 2 each; Refill: 11 - CBC - Comprehensive metabolic panel with  GFR  2. Diabetes mellitus treated with oral medication (HCC) See #1 above  3. Hyperlipidemia associated with type 2 diabetes mellitus (HCC) Continue atorvastatin  20 mg daily.  Recheck lipid profile today. - Lipid panel  4. Elevated blood pressure reading without diagnosis of hypertension DASH discussed and encouraged. She does have access to a home blood pressure device.  Advised to check blood pressure twice a week and record the readings.  Will have her see the clinical pharmacist in several weeks for recheck.  5. Rheumatoid arthritis  involving multiple sites with positive rheumatoid factor (HCC) Stable on current meds mentioned above.  6. Encounter for screening mammogram for malignant neoplasm of breast - MM 3D SCREENING MAMMOGRAM BILATERAL BREAST; Future  7. Influenza vaccination declined Recommended.  Patient declined.  8. Need for shingles vaccine Rxn sent to her pharmacy for 2nd Shingrix   Patient was given the opportunity to ask questions.  Patient verbalized understanding of the plan and was able to repeat key elements of the plan.   This documentation was completed using Paediatric nurse.  Any transcriptional errors are unintentional.  Orders Placed This Encounter  Procedures   MM 3D SCREENING MAMMOGRAM BILATERAL BREAST   CBC   Comprehensive metabolic panel with GFR   Lipid panel   POCT glucose (manual entry)   POCT glycosylated hemoglobin (Hb A1C)     Requested Prescriptions   Signed Prescriptions Disp Refills   atorvastatin  (LIPITOR) 20 MG tablet 90 tablet 1    Sig: Take 1 tablet (20 mg total) by mouth daily.   insulin  glargine-yfgn (SEMGLEE ) 100 UNIT/ML Pen 15 mL 0    Sig: Inject 13 Units into the skin daily.   Continuous Glucose Sensor (FREESTYLE LIBRE 3 PLUS SENSOR) MISC 2 each 11    Sig: Change sensor every 15 days.   glucose blood (ACCU-CHEK GUIDE TEST) test strip 100 each 12    Sig: Use to check blood sugars twice a day before meals   Blood Glucose Monitoring Suppl (ACCU-CHEK GUIDE) w/Device KIT 1 kit 0    Sig: Use to check blood sugars twice a day before meals   Accu-Chek Softclix Lancets lancets 100 each 12    Sig: Use to check blood sugars twice a day before meals   Zoster Vaccine Adjuvanted (SHINGRIX ) injection 1 each 0    Sig: Inject 0.5 mLs into the muscle once for 1 dose.    Return in about 4 months (around 01/03/2025) for BP check Luke in 4 wks.  Barnie Louder, MD, FACP

## 2024-09-02 NOTE — Patient Instructions (Addendum)
 [facility] Consult date 09/04/2024 10:00 AM   VISIT SUMMARY: Today, you came in for a follow-up visit to manage your diabetes, hyperlipidemia, and other health concerns. We discussed your current medications, recent lab results, and made some adjustments to your treatment plan to help you achieve better health outcomes.  YOUR PLAN: -TYPE 2 DIABETES MELLITUS WITH HYPERGLYCEMIA: Type 2 diabetes is a condition where your body does not use insulin  properly, leading to high blood sugar levels. Your A1c has improved to 7.8%, but our goal is to get it below 7%. Since your glucometer is broken, we will send a prescription for a new glucometer and test strips. We also discussed using a continuous glucose monitor with your iPhone, and you agreed to this. Your Lantus  insulin  dose will be increased to 13 units daily. Please check your blood sugars before breakfast and dinner, and continue to eat healthily and exercise regularly.  -ELEVATED BLOOD PRESSURE: Your blood pressure was elevated at 148/74 mmHg, which may be influenced by your caffeine intake. We recommend reducing your salt intake and increasing your consumption of fruits and vegetables. Please monitor your blood pressure at home weekly and bring the readings to your next appointment. We will recheck your blood pressure in several weeks and consider medication if it remains elevated.  -HYPERLIPIDEMIA: Hyperlipidemia is a condition where you have high levels of fats (lipids) in your blood. You are currently taking atorvastatin  20 mg daily. We will order blood tests to check your cholesterol, kidney, and liver function.  -RHEUMATOID ARTHRITIS: Rheumatoid arthritis is an autoimmune condition that causes inflammation in your joints. You are currently being managed by your rheumatologist with Enbrel and Plaquenil.  -GENERAL HEALTH MAINTENANCE: For your general health, you need to schedule a mammogram and attend your diabetic eye exam on  October 8th at 10 AM. We will provide a prescription for your second shingles vaccine.  INSTRUCTIONS: Please follow up with the following: 1. Pick up your new glucometer and test strips from the pharmacy. 2. Start using the continuous glucose monitor with your iPhone. 3. Increase your Lantus  insulin  to 13 units daily. 4. Check your blood sugars before breakfast and dinner. 5. Monitor your blood pressure at home weekly and bring the readings to your next appointment. 6. Schedule and attend your mammogram. 7. Attend your diabetic eye exam on October 8th at 10 AM. 8. Get your second shingles vaccine with the provided prescription.                      Contains text generated by Abridge.                                 Contains text generated by Abridge.

## 2024-09-03 ENCOUNTER — Ambulatory Visit: Payer: Self-pay | Admitting: Internal Medicine

## 2024-09-03 LAB — COMPREHENSIVE METABOLIC PANEL WITH GFR
ALT: 21 IU/L (ref 0–32)
AST: 20 IU/L (ref 0–40)
Albumin: 4.1 g/dL (ref 3.9–4.9)
Alkaline Phosphatase: 92 IU/L (ref 49–135)
BUN/Creatinine Ratio: 15 (ref 12–28)
BUN: 11 mg/dL (ref 8–27)
Bilirubin Total: 0.2 mg/dL (ref 0.0–1.2)
CO2: 23 mmol/L (ref 20–29)
Calcium: 9.2 mg/dL (ref 8.7–10.3)
Chloride: 106 mmol/L (ref 96–106)
Creatinine, Ser: 0.75 mg/dL (ref 0.57–1.00)
Globulin, Total: 3.4 g/dL (ref 1.5–4.5)
Glucose: 159 mg/dL — ABNORMAL HIGH (ref 70–99)
Potassium: 4 mmol/L (ref 3.5–5.2)
Sodium: 140 mmol/L (ref 134–144)
Total Protein: 7.5 g/dL (ref 6.0–8.5)
eGFR: 89 mL/min/1.73 (ref >59)

## 2024-09-03 LAB — CBC
Hematocrit: 39.2 % (ref 34.0–46.6)
Hemoglobin: 12.4 g/dL (ref 11.1–15.9)
MCH: 28.7 pg (ref 26.6–33.0)
MCHC: 31.6 g/dL (ref 31.5–35.7)
MCV: 91 fL (ref 79–97)
Platelets: 339 x10E3/uL (ref 150–450)
RBC: 4.32 x10E6/uL (ref 3.77–5.28)
RDW: 13.4 % (ref 11.7–15.4)
WBC: 6.1 x10E3/uL (ref 3.4–10.8)

## 2024-09-03 LAB — LIPID PANEL
Chol/HDL Ratio: 4.8 ratio — ABNORMAL HIGH (ref 0.0–4.4)
Cholesterol, Total: 144 mg/dL (ref 100–199)
HDL: 30 mg/dL — ABNORMAL LOW (ref >39)
LDL Chol Calc (NIH): 78 mg/dL (ref 0–99)
Triglycerides: 211 mg/dL — ABNORMAL HIGH (ref 0–149)
VLDL Cholesterol Cal: 36 mg/dL (ref 5–40)

## 2024-09-05 ENCOUNTER — Ambulatory Visit
Admission: RE | Admit: 2024-09-05 | Discharge: 2024-09-05 | Disposition: A | Discharge status: Home / Self Care | Source: Ambulatory Visit | Attending: Internal Medicine | Admitting: Internal Medicine

## 2024-09-05 DIAGNOSIS — Z1231 Encounter for screening mammogram for malignant neoplasm of breast: Secondary | ICD-10-CM | POA: Diagnosis not present

## 2024-09-10 ENCOUNTER — Other Ambulatory Visit: Payer: Self-pay

## 2024-09-11 ENCOUNTER — Other Ambulatory Visit: Payer: Self-pay

## 2024-10-03 ENCOUNTER — Ambulatory Visit: Attending: Pharmacist | Admitting: Pharmacist

## 2024-11-26 ENCOUNTER — Ambulatory Visit: Attending: Internal Medicine

## 2024-11-26 VITALS — Ht 62.0 in | Wt 164.0 lb

## 2024-11-26 DIAGNOSIS — Z0000 Encounter for general adult medical examination without abnormal findings: Secondary | ICD-10-CM | POA: Diagnosis not present

## 2024-11-26 NOTE — Patient Instructions (Signed)
 Natalie Cherry,  Thank you for taking the time for your Medicare Wellness Visit. I appreciate your continued commitment to your health goals. Please review the care plan we discussed, and feel free to reach out if I can assist you further.  Please note that Annual Wellness Visits do not include a physical exam. Some assessments may be limited, especially if the visit was conducted virtually. If needed, we may recommend an in-person follow-up with your provider.  Ongoing Care Seeing your primary care provider every 3 to 6 months helps us  monitor your health and provide consistent, personalized care.   Referrals If a referral was made during today's visit and you haven't received any updates within two weeks, please contact the referred provider directly to check on the status.  Recommended Screenings:  Health Maintenance  Topic Date Due   Medicare Annual Wellness Visit  Never done   COVID-19 Vaccine (1) Never done   Eye exam for diabetics  07/14/2022   Flu Shot  02/25/2025*   Complete foot exam   01/08/2025   Hemoglobin A1C  03/03/2025   Yearly kidney health urinalysis for diabetes  05/27/2025   Yearly kidney function blood test for diabetes  09/02/2025   Pap with HPV screening  06/10/2026   Breast Cancer Screening  09/05/2026   Cologuard (Stool DNA test)  01/10/2027   DTaP/Tdap/Td vaccine (2 - Td or Tdap) 04/16/2028   Pneumococcal Vaccine for age over 73  Completed   Hepatitis C Screening  Completed   HIV Screening  Completed   Zoster (Shingles) Vaccine  Completed   Hepatitis B Vaccine  Aged Out   HPV Vaccine  Aged Out   Meningitis B Vaccine  Aged Out   Stool Blood Test  Discontinued  *Topic was postponed. The date shown is not the original due date.       11/26/2024   12:05 PM  Advanced Directives  Does Patient Have a Medical Advance Directive? No  Would patient like information on creating a medical advance directive? No - Patient declined    Vision: Annual vision  screenings are recommended for early detection of glaucoma, cataracts, and diabetic retinopathy. These exams can also reveal signs of chronic conditions such as diabetes and high blood pressure.  Dental: Annual dental screenings help detect early signs of oral cancer, gum disease, and other conditions linked to overall health, including heart disease and diabetes.  Please see the attached documents for additional preventive care recommendations.

## 2024-11-26 NOTE — Progress Notes (Signed)
 "  Chief Complaint  Patient presents with   Medicare Wellness    INITIAL     Subjective:   Bexley Laubach is a 63 y.o. female who presents for a Medicare Annual Wellness Visit.  Visit info / Clinical Intake: Medicare Wellness Visit Type:: Initial Annual Wellness Visit Persons participating in visit and providing information:: patient Medicare Wellness Visit Mode:: Telephone If telephone:: video declined Since this visit was completed virtually, some vitals may be partially provided or unavailable. Missing vitals are due to the limitations of the virtual format.: Documented vitals are patient reported If Telephone or Video please confirm:: I connected with patient using audio/video enable telemedicine. I verified patient identity with two identifiers, discussed telehealth limitations, and patient agreed to proceed. Patient Location:: HOME Provider Location:: HOME OFFICE Interpreter Needed?: Yes Interpreter Agency: [employer] Interpreter Name: LUCIENNE Interpreter ID: [id number] Patient Declined Interpreter : No Interpretation services provided by: [place] Interpre[employer] Patient signed [place] waiver: Yes Pre-visit prep was completed: yes AWV questionnaire completed by patient prior to visit?: no Living arrangements:: lives with spouse/significant other Patient's Overall Health Status Rating: (!) fair Typical amount of pain: some Does pain affect daily life?: no Are you currently prescribed opioids?: no  Dietary Habits and Nutritional Risks How many meals a day?: 3 Eats fruit and vegetables daily?: yes Most meals are obtained by: preparing own meals In the last 2 weeks, have you had any of the following?: none Diabetic:: (!) yes Any non-healing wounds?: no How often do you check your BS?: continuous glucose monitor; 3 Would you like to be referred to a Nutritionist or for Diabetic Management? : no  Functional Status Activities of Daily Living (to include  ambulation/medication): Independent Ambulation: Independent with device- listed below Home Assistive Devices/Equipment: Eyeglasses Medication Administration: Independent Home Management (perform basic housework or laundry): Independent Manage your own finances?: yes Primary transportation is: family / friends [employer]) Concerns about vision?: no *vision screening is required for WTM* Concerns about hearing?: no  Fall Screening Falls in the past year?: 0 Number of falls in past year: 0 Was there an injury with Fall?: 0 Fall Risk Category Calculator: 0 Patient Fall Risk Level: Low Fall Risk  Fall Risk Patient at Risk for Falls Due to: No Fall Risks Fall risk Follow up: Falls evaluation completed; Education provided  Home and Transportation Safety: All rugs have non-skid backing?: N/A, no rugs All stairs or steps have railings?: yes (BEDROOM ON FIRST LEVEL) Grab bars in the bathtub or shower?: yes Have non-skid surface in bathtub or shower?: yes Good home lighting?: yes Regular seat belt use?: yes Hospital stays in the last year:: no  Cognitive Assessment Difficulty concentrating, remembering, or making decisions? : yes (SOME BUT NOT ALOT) Will 6CIT or Mini Cog be Completed: yes What year is it?: 0 points What month is it?: 0 points Give patient an address phrase to remember (5 components): MARIA HERNANDEZ [address] About what time is it?: 0 points Count backwards from 20 to 1: 0 points Say the months of the year in reverse: 0 points Repeat the address phrase from earlier: 0 points 6 CIT Score: 0 points  Advance Directives (For Healthcare) Does Patient Have a Medical Advance Directive?: No Would patient like information on creating a medical advance directive?: No - Patient declined  Reviewed/Updated  Reviewed/Updated: Reviewed All (Medical, Surgical, Family, Medications, Allergies, Care Teams, Patient Goals)    Allergies (verified) Patient has no known allergies.    Current Medications (verified) Outpatient  Encounter Medications as of 11/26/2024  Medication Sig   Accu-Chek Softclix Lancets lancets Use to check blood sugars twice a day before meals   acetaminophen -codeine  (TYLENOL  #3) 300-30 MG tablet Take 1 tablet by mouth every 12 (twelve) hours as needed for moderate pain.   atorvastatin  (LIPITOR) 20 MG tablet Take 1 tablet (20 mg total) by mouth daily.   Blood Glucose Monitoring Suppl (ACCU-CHEK GUIDE) w/Device KIT Use to check blood sugars twice a day before meals   cetirizine  (ZYRTEC  ALLERGY) 10 MG tablet Take 1 tablet (10 mg total) by mouth daily as needed for allergies.   Continuous Glucose Sensor (FREESTYLE LIBRE 3 PLUS SENSOR) MISC Change sensor every 15 days.   denosumab  (PROLIA ) 60 MG/ML SOSY injection Inject 60 mg into the skin every 6 (six) months.   diclofenac  Sodium (VOLTAREN ) 1 % GEL Apply 2 g topically 4 (four) times daily.   etanercept (ENBREL SURECLICK) 50 MG/ML injection    fluconazole  (DIFLUCAN ) 150 MG tablet Take 1 tablet (150 mg total) by mouth daily.   glucose blood (ACCU-CHEK GUIDE TEST) test strip Use to check blood sugars twice a day before meals   hydroxychloroquine (PLAQUENIL) 200 MG tablet Take 200 mg by mouth daily.   insulin  glargine-yfgn (SEMGLEE ) 100 UNIT/ML Pen Inject 13 Units into the skin daily.   Insulin  Pen Needle (BD PEN NEEDLE NANO U/F) 32G X 4 MM MISC Use as directed with insulin  pen   sitaGLIPtin -metformin  (JANUMET ) 50-1000 MG tablet Take 1 tablet by mouth 2 (two) times daily with a meal.   No facility-administered encounter medications on file as of 11/26/2024.    History: Past Medical History:  Diagnosis Date   Arthritis    Diabetes mellitus    Hypertension    Past Surgical History:  Procedure Laterality Date   CHOLECYSTECTOMY     approx 6 yrs ago in [place]    COLONOSCOPY     approx 15 years ago in [place]   Family History  Problem Relation Age of Onset   Diabetes Mother    Asthma Father    Colon  cancer Neg Hx    Stomach cancer Neg Hx    Esophageal cancer Neg Hx    Pancreatic cancer Neg Hx    Liver disease Neg Hx    Breast cancer Neg Hx    Social History   Occupational History   Not on file  Tobacco Use   Smoking status: Never   Smokeless tobacco: Never  Vaping Use   Vaping status: Never Used  Substance and Sexual Activity   Alcohol use: No   Drug use: No   Sexual activity: Not Currently   Tobacco Counseling Counseling given: Not Answered  SDOH Screenings   Food Insecurity: No Food Insecurity (11/26/2024)  Housing: Low Risk (11/26/2024)  Transportation Needs: No Transportation Needs (11/26/2024)  Utilities: At Risk (11/26/2024)  Alcohol Screen: Low Risk (11/26/2024)  Depression (PHQ2-9): Low Risk (11/26/2024)  Recent Concern: Depression (PHQ2-9) - Medium Risk (09/02/2024)  Financial Resource Strain: Low Risk (11/26/2024)  Physical Activity: Insufficiently Active (11/26/2024)  Social Connections: Socially Integrated (11/26/2024)  Stress: No Stress Concern Present (11/26/2024)  Tobacco Use: Low Risk (11/26/2024)  Health Literacy: Adequate Health Literacy (11/26/2024)   See flowsheets for full screening details  Depression Screen PHQ 2 & 9 Depression Scale- Over the past 2 weeks, how often have you been bothered by any of the following problems? Little interest or pleasure in doing things: 0 Feeling down, depressed, or hopeless (PHQ Adolescent also includes...irritable):  0 PHQ-2 Total Score: 0 Trouble falling or staying asleep, or sleeping too much: 0 (SLEEPS WELL) Feeling tired or having little energy: 0 Poor appetite or overeating (PHQ Adolescent also includes...weight loss): 0 Feeling bad about yourself - or that you are a failure or have let yourself or your family down: 0 Trouble concentrating on things, such as reading the newspaper or watching television (PHQ Adolescent also includes...like school work): 0 Moving or speaking so slowly that other people  could have noticed. Or the opposite - being so fidgety or restless that you have been moving around a lot more than usual: 0 Thoughts that you would be better off dead, or of hurting yourself in some way: 0 PHQ-9 Total Score: 0 If you checked off any problems, how difficult have these problems made it for you to do your work, take care of things at home, or get along with other people?: Not difficult at all  Depression Treatment Depression Interventions/Treatment : EYV7-0 Score <4 Follow-up Not Indicated     Goals Addressed             This Visit's Progress    11/26/2024:  To keep taking care of myself and remain stable.               Objective:    Today's Vitals   11/26/24 1201  Weight: 164 lb (74.4 kg)  Height: 5' 2 (1.575 m)  PainSc: 4   PainLoc: Hip   Body mass index is 30 kg/m.  Hearing/Vision screen Hearing Screening - Comments:: Denies hearing difficulties.  Vision Screening - Comments:: Wears rx glasses - up to date with routine eye exams. Patient could not remember the name of eye doctor.  Patient stated that she does have an upcoming appointment with the eye doctor in January 2026.  Immunizations and Health Maintenance Health Maintenance  Topic Date Due   COVID-19 Vaccine (1) Never done   OPHTHALMOLOGY EXAM  07/14/2022   Influenza Vaccine  02/25/2025 (Originally 06/28/2024)   FOOT EXAM  01/08/2025   HEMOGLOBIN A1C  03/03/2025   Diabetic kidney evaluation - Urine ACR  05/27/2025   Diabetic kidney evaluation - eGFR measurement  09/02/2025   Medicare Annual Wellness (AWV)  11/26/2025   Cervical Cancer Screening (HPV/Pap Cotest)  06/10/2026   Mammogram  09/05/2026   Fecal DNA (Cologuard)  01/10/2027   DTaP/Tdap/Td (2 - Td or Tdap) 04/16/2028   Pneumococcal Vaccine: 50+ Years  Completed   Hepatitis C Screening  Completed   HIV Screening  Completed   Zoster Vaccines- Shingrix   Completed   Hepatitis B Vaccines 19-59 Average Risk  Aged Out   HPV VACCINES   Aged Out   Meningococcal B Vaccine  Aged Out   COLON CANCER SCREENING ANNUAL FOBT  Discontinued        Assessment/Plan:  This is a routine wellness examination for Jamillah.  Patient Care Team: Vicci Barnie NOVAK, MD as PCP - General (Internal Medicine) Leni Marjory MATSU, MD as Consulting Physician (Rheumatology) Mikel Leeroy Jenkins HERO, MD as Consulting Physician (Rheumatology) Jerri Kay HERO, MD as Attending Physician (Orthopedic Surgery)  I have personally reviewed and noted the following in the patients chart:   Medical and social history Use of alcohol, tobacco or illicit drugs  Current medications and supplements including opioid prescriptions. Functional ability and status Nutritional status Physical activity Advanced directives List of other physicians Hospitalizations, surgeries, and ER visits in previous 12 months Vitals Screenings to include cognitive, depression, and falls Referrals  and appointments  No orders of the defined types were placed in this encounter.  In addition, I have reviewed and discussed with patient certain preventive protocols, quality metrics, and best practice recommendations. A written personalized care plan for preventive services as well as general preventive health recommendations were provided to patient.   Roz LOISE Fuller, LPN   [id number]   Return in about 1 year (around 11/26/2025) for Medicare wellness.  After Visit Summary: (MyChart) Due to this being a telephonic visit, the after visit summary with patients personalized plan was offered to patient via MyChart   Nurse Notes: Patient aware of current care gaps.  Patient stated that she is scheduled for an eye exam in January 2026.   "

## 2025-01-03 ENCOUNTER — Ambulatory Visit: Payer: Self-pay | Admitting: Internal Medicine

## 2025-01-30 ENCOUNTER — Ambulatory Visit: Admitting: Internal Medicine
